# Patient Record
Sex: Male | Born: 1950 | Race: White | Hispanic: No | Marital: Married | Smoking: Former smoker
Health system: Southern US, Community
[De-identification: ages and names within clinical notes are randomized; demographics above are authoritative.]

## PROBLEM LIST (undated history)

## (undated) DIAGNOSIS — E8881 Metabolic syndrome: Secondary | ICD-10-CM

## (undated) DIAGNOSIS — N529 Male erectile dysfunction, unspecified: Secondary | ICD-10-CM

## (undated) DIAGNOSIS — E785 Hyperlipidemia, unspecified: Secondary | ICD-10-CM

## (undated) DIAGNOSIS — E119 Type 2 diabetes mellitus without complications: Secondary | ICD-10-CM

## (undated) DIAGNOSIS — E039 Hypothyroidism, unspecified: Secondary | ICD-10-CM

## (undated) DIAGNOSIS — J449 Chronic obstructive pulmonary disease, unspecified: Secondary | ICD-10-CM

## (undated) DIAGNOSIS — R7303 Prediabetes: Secondary | ICD-10-CM

## (undated) DIAGNOSIS — G4730 Sleep apnea, unspecified: Secondary | ICD-10-CM

## (undated) DIAGNOSIS — M1990 Unspecified osteoarthritis, unspecified site: Secondary | ICD-10-CM

## (undated) DIAGNOSIS — K5790 Diverticulosis of intestine, part unspecified, without perforation or abscess without bleeding: Secondary | ICD-10-CM

## (undated) HISTORY — DX: Diverticulosis of intestine, part unspecified, without perforation or abscess without bleeding: K57.90

## (undated) HISTORY — DX: Hypothyroidism, unspecified: E03.9

## (undated) HISTORY — DX: Metabolic syndrome: E88.810

## (undated) HISTORY — DX: Hyperlipidemia, unspecified: E78.5

## (undated) HISTORY — DX: Male erectile dysfunction, unspecified: N52.9

## (undated) HISTORY — DX: Type 2 diabetes mellitus without complications: E11.9

## (undated) HISTORY — DX: Sleep apnea, unspecified: G47.30

## (undated) HISTORY — DX: Unspecified osteoarthritis, unspecified site: M19.90

## (undated) HISTORY — DX: Chronic obstructive pulmonary disease, unspecified: J44.9

## (undated) HISTORY — DX: Metabolic syndrome: E88.81

## (undated) HISTORY — PX: POLYPECTOMY: SHX149

## (undated) HISTORY — PX: VASECTOMY: SHX75

---

## 1998-01-08 ENCOUNTER — Ambulatory Visit: Admission: RE | Admit: 1998-01-08 | Discharge: 1998-01-08 | Payer: Self-pay | Admitting: Otolaryngology

## 1998-02-26 ENCOUNTER — Ambulatory Visit: Admission: RE | Admit: 1998-02-26 | Discharge: 1998-02-26 | Payer: Self-pay | Admitting: Otolaryngology

## 2000-07-07 ENCOUNTER — Emergency Department (HOSPITAL_COMMUNITY): Admission: EM | Admit: 2000-07-07 | Discharge: 2000-07-07 | Payer: Self-pay | Admitting: Emergency Medicine

## 2003-04-18 HISTORY — PX: OTHER SURGICAL HISTORY: SHX169

## 2005-04-17 HISTORY — PX: OTHER SURGICAL HISTORY: SHX169

## 2005-10-04 ENCOUNTER — Ambulatory Visit: Payer: Self-pay | Admitting: Family Medicine

## 2005-10-23 ENCOUNTER — Ambulatory Visit: Payer: Self-pay | Admitting: Internal Medicine

## 2005-11-03 ENCOUNTER — Ambulatory Visit: Payer: Self-pay | Admitting: Internal Medicine

## 2005-11-20 ENCOUNTER — Ambulatory Visit: Payer: Self-pay | Admitting: Internal Medicine

## 2005-12-15 ENCOUNTER — Ambulatory Visit: Payer: Self-pay | Admitting: Gastroenterology

## 2006-01-04 ENCOUNTER — Encounter: Payer: Self-pay | Admitting: Gastroenterology

## 2006-01-04 ENCOUNTER — Ambulatory Visit: Payer: Self-pay | Admitting: Gastroenterology

## 2006-02-26 ENCOUNTER — Ambulatory Visit: Payer: Self-pay | Admitting: Internal Medicine

## 2006-03-05 ENCOUNTER — Ambulatory Visit: Payer: Self-pay | Admitting: Internal Medicine

## 2006-03-05 LAB — CONVERTED CEMR LAB
ALT: 35 units/L (ref 0–40)
AST: 26 units/L (ref 0–37)
Chol/HDL Ratio, serum: 4.8
LDL DIRECT: 117.6 mg/dL
Triglyceride fasting, serum: 354 mg/dL (ref 0–149)

## 2007-07-14 ENCOUNTER — Telehealth (INDEPENDENT_AMBULATORY_CARE_PROVIDER_SITE_OTHER): Payer: Self-pay | Admitting: *Deleted

## 2007-07-15 ENCOUNTER — Telehealth (INDEPENDENT_AMBULATORY_CARE_PROVIDER_SITE_OTHER): Payer: Self-pay | Admitting: *Deleted

## 2007-08-05 ENCOUNTER — Ambulatory Visit: Payer: Self-pay | Admitting: Internal Medicine

## 2007-08-11 ENCOUNTER — Encounter (INDEPENDENT_AMBULATORY_CARE_PROVIDER_SITE_OTHER): Payer: Self-pay | Admitting: *Deleted

## 2007-08-11 LAB — CONVERTED CEMR LAB
ALT: 84 units/L — ABNORMAL HIGH (ref 0–53)
AST: 44 units/L — ABNORMAL HIGH (ref 0–37)
Alkaline Phosphatase: 49 units/L (ref 39–117)
Bilirubin, Direct: 0.1 mg/dL (ref 0.0–0.3)
CO2: 28 meq/L (ref 19–32)
Chloride: 110 meq/L (ref 96–112)
Glucose, Bld: 108 mg/dL — ABNORMAL HIGH (ref 70–99)
Sodium: 142 meq/L (ref 135–145)
Total Bilirubin: 1.6 mg/dL — ABNORMAL HIGH (ref 0.3–1.2)
Total CHOL/HDL Ratio: 6.1
Total Protein: 7 g/dL (ref 6.0–8.3)
Triglycerides: 297 mg/dL (ref 0–149)

## 2007-08-12 ENCOUNTER — Ambulatory Visit: Payer: Self-pay | Admitting: Internal Medicine

## 2007-08-12 DIAGNOSIS — E8881 Metabolic syndrome: Secondary | ICD-10-CM

## 2007-08-12 DIAGNOSIS — E039 Hypothyroidism, unspecified: Secondary | ICD-10-CM

## 2007-08-12 DIAGNOSIS — E1169 Type 2 diabetes mellitus with other specified complication: Secondary | ICD-10-CM | POA: Insufficient documentation

## 2007-08-12 DIAGNOSIS — E781 Pure hyperglyceridemia: Secondary | ICD-10-CM

## 2007-08-12 DIAGNOSIS — G4730 Sleep apnea, unspecified: Secondary | ICD-10-CM | POA: Insufficient documentation

## 2007-08-12 LAB — CONVERTED CEMR LAB
HDL goal, serum: 40 mg/dL
LDL Goal: 130 mg/dL

## 2008-01-02 ENCOUNTER — Telehealth (INDEPENDENT_AMBULATORY_CARE_PROVIDER_SITE_OTHER): Payer: Self-pay | Admitting: *Deleted

## 2008-09-09 ENCOUNTER — Ambulatory Visit: Payer: Self-pay | Admitting: Internal Medicine

## 2008-09-09 LAB — CONVERTED CEMR LAB
AST: 41 units/L — ABNORMAL HIGH (ref 0–37)
Albumin: 4.1 g/dL (ref 3.5–5.2)
Alkaline Phosphatase: 62 units/L (ref 39–117)
Bilirubin, Direct: 0.1 mg/dL (ref 0.0–0.3)
Direct LDL: 128.9 mg/dL
HDL: 34.4 mg/dL — ABNORMAL LOW (ref >39.00)
Total Bilirubin: 1.4 mg/dL — ABNORMAL HIGH (ref 0.3–1.2)
Total CHOL/HDL Ratio: 6
VLDL: 43.6 mg/dL — ABNORMAL HIGH (ref 0.0–40.0)

## 2008-09-11 ENCOUNTER — Ambulatory Visit: Payer: Self-pay | Admitting: Internal Medicine

## 2008-09-11 DIAGNOSIS — M722 Plantar fascial fibromatosis: Secondary | ICD-10-CM

## 2008-09-11 DIAGNOSIS — R609 Edema, unspecified: Secondary | ICD-10-CM

## 2009-05-26 ENCOUNTER — Telehealth (INDEPENDENT_AMBULATORY_CARE_PROVIDER_SITE_OTHER): Payer: Self-pay | Admitting: *Deleted

## 2009-06-07 ENCOUNTER — Ambulatory Visit: Payer: Self-pay | Admitting: Internal Medicine

## 2009-06-08 LAB — CONVERTED CEMR LAB
Cholesterol: 196 mg/dL (ref 0–200)
Direct LDL: 138 mg/dL
Hgb A1c MFr Bld: 5.7 % (ref 4.6–6.5)
VLDL: 43.4 mg/dL — ABNORMAL HIGH (ref 0.0–40.0)

## 2009-06-14 ENCOUNTER — Ambulatory Visit: Payer: Self-pay | Admitting: Internal Medicine

## 2009-06-14 DIAGNOSIS — Z86010 Personal history of colonic polyps: Secondary | ICD-10-CM

## 2009-06-14 DIAGNOSIS — K5730 Diverticulosis of large intestine without perforation or abscess without bleeding: Secondary | ICD-10-CM | POA: Insufficient documentation

## 2009-06-14 DIAGNOSIS — F329 Major depressive disorder, single episode, unspecified: Secondary | ICD-10-CM

## 2009-06-14 DIAGNOSIS — R1013 Epigastric pain: Secondary | ICD-10-CM

## 2010-05-17 NOTE — Progress Notes (Signed)
Summary: Appointment Due  Phone Note Outgoing Call Call back at [facility] Phone [phone]   Call placed by: Shonna Chock,  May 26, 2009 10:25 AM Call placed to: Patient Summary of Call: Please contact paitent for lab appointment. Labs due in 12/2008 based on last lab checked, copied and pasted from 09/09/2008:   Please schedule a follow-up appointment in 4 months.NMR Lipoprofile Lipid Panel prior to visit, ICD-9: 277.7, 272.4 3)  TSH prior to visit, ICD-9: 244.9 4)  HbgA1C prior to visit, ICD-9:277.7. Consider over the calf support hose & arch supports; Podiatry consult if no better.  ***This is necessary to continue refilling medications***    Additional Follow-up for Phone Call Additional follow up Details #2::    PATIENT HAS A APPT ON FEB 28,2011 FOR HIS FOLLOW-UP APPT AND LABS ON FEB 21,2011.Marland KitchenMarland KitchenBarb Merino  May 27, 2009 9:03 AM  Follow-up by: Barb Merino,  May 27, 2009 9:03 AM

## 2010-05-17 NOTE — Assessment & Plan Note (Signed)
Summary: 4 MTH FU/KDC   Vital Signs:  Patient profile:   60 year old male Height:      70.5 inches Weight:      286 pounds BMI:     40.60 Pulse rate:   56 / minute Resp:     15 per minute BP sitting:   120 / 68  (left arm)  Vitals Entered By: Doristine Devoid (June 14, 2009 8:03 AM) CC: 4 month roa- also some stomach pain xfew months feels bloating and some discomfort after eating, Abdominal pain   CC:  4 month roa- also some stomach pain xfew months feels bloating and some discomfort after eating and Abdominal pain.  History of Present Illness: Labs reviewed & risks discussed; TG 217, VLDL 43.4, & LDL 138. No diet; weight  up 20-30# with being unemployed & over holidays.On [facility] he lost 35#. No CVE since he is no longer physically active as he was @ [employer].He had abdominal pain 02/25 , but he has has early satiety & abdominal "tightness" after meals.    The patient reports nausea, vomiting, and diarrhea 06/11/2009 after ingesting hushpuppies, ice cream ,cookies & peaches. He  denies constipation, melena, hematochezia, anorexia, and hematemesis.  The location of the pain is epigastric.  The pain is described as sharp.  The patient denies the following symptoms: fever, weight loss, dysuria, chest pain, jaundice, and dark urine.  The painoccurred with specific food as noted  The pain was minimally  better with antacids.  His brother is overweight with DM & enlarged liver.  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 ENT:  Denies difficulty swallowing and hoarseness. Resp:  Denies excessive snoring, hypersomnolence, and morning headaches. GI:  Denies constipation and indigestion; N,V & diarrhea was isolated   episode. GU:  Denies discharge and dysuria. Derm:  Denies changes in nail beds, dryness, and hair loss. Neuro:  Complains of numbness; denies tingling; Numbess in feet from ruptured dics. Psych:  Complains of anxiety, depression, easily angered, and irritability; denies easily tearful. Endo:  Complains of heat intolerance; denies cold intolerance, excessive hunger, excessive thirst, and excessive urination.  Physical Exam  General:  in no acute distress; alert,appropriate and cooperative throughout examination Eyes:  No corneal or conjunctival inflammation noted.No icterus Mouth:  Oral mucosa and oropharynx without lesions or exudates.  Teeth in good repair.No pharyngeal erythema.   Neck:  No deformities, masses, or tenderness noted. Lungs:  Normal respiratory effort, chest expands symmetrically. Lungs are clear to auscultation, no crackles or wheezes. Heart:  regular rhythm and bradycardia.  S4 with slurring Abdomen:  Bowel sounds positive,abdomen soft and non-tender without masses, organomegaly or hernias noted. Protuberant ; RUQ dullness Pulses:  R and L carotid,radial,dorsalis pedis and posterior tibial pulses are full and equal bilaterally Extremities:  No clubbing, cyanosis, edema. Neurologic:  alert & oriented X3 and DTRs symmetrical and normal.   Skin:  Intact without suspicious lesions or rashes. No jaundice Cervical Nodes:  No lymphadenopathy noted Axillary Nodes:  No palpable lymphadenopathy Psych:  normally interactive  but  subdued.     Impression & Recommendations:  Problem # 1:  ABDOMINAL PAIN, EPIGASTRIC (ICD-789.06) Probable HH;R/O GB disease  Problem # 2:  SLEEP APNEA (ICD-780.57) controlled with CPAP  Problem #  3:  HYPOTHYROIDISM (ICD-244.9) TSH therapeutic His updated medication list for this problem includes:    Levothyroxine Sodium 25 Mcg Tabs (Levothyroxine sodium) .Marland Kitchen... Take one tablet daily m,w,f,sun; 1& 1/2  tues ,thurs , sat. pt due for labs  now  Problem # 4:  COLONIC POLYPS, HX OF (ICD-V12.72) Colonoscopy as per GI  Problem # 5:  DEPRESSION (ICD-311)  His updated medication list for this problem includes:    Fluoxetine Hcl 10 Mg Caps (Fluoxetine hcl) .Marland Kitchen... 1 once daily  Complete Medication List: 1)  Levothyroxine Sodium 25 Mcg Tabs (Levothyroxine sodium) .... Take one tablet daily m,w,f,sun; 1& 1/2  tues ,thurs , sat. pt due for labs now 2)  Centrum Silver  3)  Glucosamine  4)  Ibuprofen Daily  5)  Transderm-scop 1.5 Mg Pt72 (Scopolamine base) .Marland Kitchen.. 1 patch every 3 day as needed 6)  Fluoxetine Hcl 10 Mg Caps (Fluoxetine hcl) .Marland Kitchen.. 1 once daily  Patient Instructions: 1)  Follow a low carb diet (NOT Atkin's); consume LESS THAN 40 grams of sugar / day from foods & drinks with High Fructose Corn Syrup as #1,2 or #3 on label. 2)  Please schedule a follow-up appointment in 6 months. 3)  Lipid Panel prior to visit, ICD-9:277.7 Prescriptions: FLUOXETINE HCL 10 MG CAPS (FLUOXETINE HCL) 1 once daily  #30 x 5   Entered and Authorized by:   Marga Melnick MD   Signed by:   Marga Melnick MD on 06/14/2009   Method used:   Print then Give to Patient   RxID:   [id number] LEVOTHYROXINE SODIUM 25 MCG  TABS (LEVOTHYROXINE SODIUM) Take one tablet daily M,W,F,Sun; 1& 1/2  Tues ,Thurs , Sat. pt due for labs now  #90 Tablet x 3   Entered and Authorized by:   Marga Melnick MD   Signed by:   Marga Melnick MD on 06/14/2009   Method used:   Printed then faxed to ...       [id number] (retail)       [address]       [place], [place]  [address]       Ph: [phone] or [phone]       Fax: [phone]   RxID:   [id number]

## 2010-05-17 NOTE — Assessment & Plan Note (Signed)
Summary: checkup/ meds/kdc   Vital Signs:  Patient profile:   60 year old male Height:      70.5 inches Weight:      267.8 pounds BMI:     38.02 Temp:     98.3 degrees F oral Pulse rate:   84 / minute Resp:     16 per minute BP sitting:   128 / 76  (left arm) Cuff size:   large  Vitals Entered By: Shonna Chock (Sep 11, 2008 10:40 AM) CC: FOLLOW-UP ON LABS : DISCUSS DISPENSE NUMBER FOR THYROID MEDS   CC:  FOLLOW-UP ON LABS : DISCUSS DISPENSE NUMBER FOR THYROID MEDS.  History of Present Illness: Not on low carb now; active on job but no CVE. Labs reviewed , risks, & low glycemic index & load  discussed .  Allergies (verified): No Known Drug Allergies  Review of Systems General:  Complains of sleep disorder; denies fatigue. ENT:  Denies difficulty swallowing and hoarseness. CV:  Complains of swelling of feet; denies chest pain or discomfort, leg cramps with exertion, and shortness of breath with exertion; DOE improved. RLE edema . Resp:  Denies excessive snoring, hypersomnolence, and morning headaches; Newmask with improvement. GI:  Denies constipation and diarrhea. GU:  [place] urgency. MS:  Complains of joint pain; L heel hurts. Derm:  Denies changes in nail beds, dryness, and hair loss. Neuro:  Complains of numbness; Numbness R great  toe . Endo:  Complains of heat intolerance; denies cold intolerance, excessive hunger, excessive thirst, and excessive urination.  Physical Exam  General:  in no acute distress; alert,appropriate and cooperative throughout examination Neck:  No deformities, masses, or tenderness noted. Lungs:  Normal respiratory effort, chest expands symmetrically. Lungs are clear to auscultation, no crackles or wheezes. Heart:  Normal rate and regular rhythm. S1 and S2 normal without gallop, murmur, click, rub.S4 Pulses:  R and L carotid,radial,dorsalis pedis and posterior tibial pulses are full and equal bilaterally Extremities:  No clubbing, cyanosis,  edema. Varicose veins RLE > LLE. Deformed toenails. Pes planus. L Achilles tendon & heel not tender to percussion Neurologic:  DTRs symmetrical and normal.  Decreased sensation R great toe  Skin:  Intact without suspicious lesions or rashes Psych:  memory intact for recent and remote, normally interactive, and good eye contact.     Impression & Recommendations:  Problem # 1:  DYSMETABOLIC SYNDROME X (ICD-277.7)  Problem # 2:  HYPOTHYROIDISM (ICD-244.9)  His updated medication list for this problem includes:    Levothyroxine Sodium 25 Mcg Tabs (Levothyroxine sodium) .Marland Kitchen... Take one tablet daily m,w,f,sun; 1& 1/2  tues ,thurs , sat. pt due for labs now  Problem # 3:  EDEMA- LOCALIZED (ICD-782.3) Due to VV;support hose declined  Problem # 4:  PLANTAR FASCIITIS, LEFT (ICD-728.71)  Complete Medication List: 1)  Levothyroxine Sodium 25 Mcg Tabs (Levothyroxine sodium) .... Take one tablet daily m,w,f,sun; 1& 1/2  tues ,thurs , sat. pt due for labs now 2)  Centrum Silver  3)  Glucosamine  4)  Ibuprofen Daily  5)  Transderm-scop 1.5 Mg Pt72 (Scopolamine base) .Marland Kitchen.. 1 patch every 3 day as needed  Patient Instructions: 1)  Follow [employer] low glycemic index & load program as discussed. 2)  Please schedule a follow-up appointment in 4 months.NMR Lipoprofile Lipid Panel prior to visit, ICD-9: 277.7, 272.4 3)  TSH prior to visit, ICD-9: 244.9 4)  HbgA1C prior to visit, ICD-9:277.7. Consider over the calf support hose & arch supports;  Podiatry consult if no better. Prescriptions: LEVOTHYROXINE SODIUM 25 MCG  TABS (LEVOTHYROXINE SODIUM) Take one tablet daily M,W,F,Sun; 1& 1/2  Tues ,Thurs , Sat. pt due for labs now  #90 x 3   Entered and Authorized by:   Marga Melnick MD   Signed by:   Marga Melnick MD on 09/11/2008   Method used:   Print then Give to Patient   RxID:   [id number]

## 2010-08-22 ENCOUNTER — Other Ambulatory Visit: Payer: Self-pay | Admitting: Internal Medicine

## 2010-08-22 NOTE — Telephone Encounter (Signed)
TSH 244.9 

## 2010-11-14 ENCOUNTER — Telehealth: Payer: Self-pay | Admitting: Internal Medicine

## 2010-11-14 MED ORDER — LEVOTHYROXINE SODIUM 25 MCG PO TABS: ORAL_TABLET | ORAL | Status: DC

## 2010-11-14 NOTE — Telephone Encounter (Signed)
SENT IN

## 2010-12-09 ENCOUNTER — Other Ambulatory Visit: Payer: Self-pay | Admitting: Internal Medicine

## 2010-12-10 ENCOUNTER — Other Ambulatory Visit: Payer: Self-pay | Admitting: Internal Medicine

## 2010-12-12 NOTE — Telephone Encounter (Signed)
Spoke with patient, patient aware rx will be updated at next refill.  Yes please add lipids 277.7

## 2010-12-12 NOTE — Telephone Encounter (Signed)
Added lipids to 8/31 lab

## 2010-12-15 ENCOUNTER — Other Ambulatory Visit: Payer: Self-pay | Admitting: Internal Medicine

## 2010-12-15 DIAGNOSIS — E8881 Metabolic syndrome: Secondary | ICD-10-CM

## 2010-12-15 DIAGNOSIS — E039 Hypothyroidism, unspecified: Secondary | ICD-10-CM

## 2010-12-16 ENCOUNTER — Other Ambulatory Visit (INDEPENDENT_AMBULATORY_CARE_PROVIDER_SITE_OTHER): Payer: BC Managed Care – PPO

## 2010-12-16 DIAGNOSIS — E8881 Metabolic syndrome: Secondary | ICD-10-CM

## 2010-12-16 DIAGNOSIS — E039 Hypothyroidism, unspecified: Secondary | ICD-10-CM

## 2010-12-16 LAB — TSH: TSH: 18.63 u[IU]/mL — ABNORMAL HIGH (ref 0.35–5.50)

## 2010-12-16 LAB — LIPID PANEL: Cholesterol: 198 mg/dL (ref 0–200)

## 2010-12-16 NOTE — Progress Notes (Signed)
Labs only

## 2010-12-20 ENCOUNTER — Encounter: Payer: Self-pay | Admitting: *Deleted

## 2010-12-27 ENCOUNTER — Telehealth: Payer: Self-pay | Admitting: Internal Medicine

## 2010-12-27 NOTE — Telephone Encounter (Signed)
Message copied by Merrie Roof on Tue Dec 27, 2010 11:59 AM ------      Message from: Pecola Lawless      Created: Mon Dec 12, 2010  5:41 PM       Please  schedule fasting Labs : BMET,Lipids, hepatic panel, TSH ( 277.7, 244.9) & F/U appt. Last labs 02/11

## 2010-12-27 NOTE — Telephone Encounter (Signed)
Labs 9/12 will determine whether we need to have his  physical rescheduled earlier.

## 2010-12-27 NOTE — Telephone Encounter (Signed)
Had labs for tsh and lipids on 8/31, will have other two labs on 9/12---when he comes in for labs, will make f/up appt for Dr Pura Spice has appt for CPX on 02/24/2011

## 2010-12-27 NOTE — Telephone Encounter (Signed)
error 

## 2010-12-28 ENCOUNTER — Other Ambulatory Visit: Payer: Self-pay | Admitting: Internal Medicine

## 2010-12-29 ENCOUNTER — Other Ambulatory Visit (INDEPENDENT_AMBULATORY_CARE_PROVIDER_SITE_OTHER): Payer: BC Managed Care – PPO

## 2010-12-29 DIAGNOSIS — E039 Hypothyroidism, unspecified: Secondary | ICD-10-CM

## 2010-12-29 DIAGNOSIS — E8881 Metabolic syndrome: Secondary | ICD-10-CM

## 2010-12-29 LAB — BASIC METABOLIC PANEL
BUN: 16 mg/dL (ref 6–23)
Chloride: 108 mEq/L (ref 96–112)
Glucose, Bld: 114 mg/dL — ABNORMAL HIGH (ref 70–99)
Potassium: 4 mEq/L (ref 3.5–5.1)
Sodium: 141 mEq/L (ref 135–145)

## 2010-12-29 LAB — HEPATIC FUNCTION PANEL
ALT: 95 U/L — ABNORMAL HIGH (ref 0–53)
AST: 54 U/L — ABNORMAL HIGH (ref 0–37)
Albumin: 4.3 g/dL (ref 3.5–5.2)
Alkaline Phosphatase: 78 U/L (ref 39–117)
Total Bilirubin: 1.1 mg/dL (ref 0.3–1.2)

## 2010-12-29 NOTE — Progress Notes (Signed)
Labs only

## 2010-12-30 NOTE — Progress Notes (Signed)
Labs only

## 2011-01-03 ENCOUNTER — Ambulatory Visit (INDEPENDENT_AMBULATORY_CARE_PROVIDER_SITE_OTHER): Payer: BC Managed Care – PPO | Admitting: Internal Medicine

## 2011-01-03 ENCOUNTER — Encounter: Payer: Self-pay | Admitting: Internal Medicine

## 2011-01-03 DIAGNOSIS — E039 Hypothyroidism, unspecified: Secondary | ICD-10-CM

## 2011-01-03 DIAGNOSIS — E785 Hyperlipidemia, unspecified: Secondary | ICD-10-CM

## 2011-01-03 DIAGNOSIS — E8881 Metabolic syndrome: Secondary | ICD-10-CM

## 2011-01-03 NOTE — Progress Notes (Signed)
  Subjective:    Patient ID: Dalton Murphy, male    DOB: 08/08/1950, 60 y.o.   MRN: [id number]  HPI Hyperlipidemia: Triglycerides 602, HDL 32.8, LDL 120.4 Chest pain, palpitations- no       Dyspnea- some DOE with yardwork Medications: Compliance- not on statin  Lightheadedness only if meal skipped; no Syncope  Edema- some Abd pain, bowel changes- no (see TG )   Muscle aches- some cramps  Metabolic Syndrome: Disease Monitoring: Blood Sugar ranges-not checked  Polyuria/phagia/dipsia- only urinary urgency       Visual problems- no Medications: Compliance- no meds  Hypoglycemic symptoms- only as noted above  Hypothyroidism: TSH 18.63 Medications status(change in dose/brand/mode of administration):no change Constitutional: Weight change: stable; Fatigue:no; Sleep pattern:using CPAP; Appetite:good  Hoarseness:no; Swallowing issues:no GI: Constipation:no; Diarrhea:no but some loose stool Derm: Change in nails/hair/skin:no Neuro: Numbness/tingling:some R foot @ base of large toe; Tremor:no Psych: Anxiety:intermittent; Depression:no Endo: Temperature intolerance: Heat:yes; Cold:no     Review of Systems     Objective:   Physical Exam Gen.: well-nourished in appearance; but weight excess. Alert, appropriate and cooperative throughout exam. Head: Normocephalic without obvious abnormalities  Eyes: No corneal or conjunctival inflammation noted.  Extraocular motion intact. No lid lag Mouth: Oral mucosa and oropharynx reveal no lesions or exudates. Teeth in good repair.Hoarse Neck: No deformities, masses, or tenderness noted. Range of motion slightly decreased. Thyroid normal. Lungs: Normal respiratory effort; chest expands symmetrically. Lungs are clear to auscultation without rales, wheezes, or increased work of breathing. Heart: Slow rate and regular rhythm. Normal S1 and S2. No gallop, click, or rub. No  murmur. Abdomen: Bowel sounds normal; abdomen soft and nontender. No masses,  organomegaly.Ventral hernia noted.                                                                            Musculoskeletal/extremities: No deformity or scoliosis noted of  the thoracic or lumbar spine. No clubbing, cyanosis, edema, or deformity noted.Tone & strength  Normal.Joints: minor DIP OA changes. Valgus knee changes.  Nail health :chronic changes L great toe nail  Vascular: Carotid, radial artery, dorsalis pedis and  posterior tibial pulses are full and equal. No bruits present. Neurologic: Alert and oriented x3. Deep tendon reflexes symmetrical but 0-1/2 + @ nees         Skin: Intact without suspicious lesions or rashes. Lymph: No cervical, axillary lymphadenopathy present. Psych: Mood and affect are slightly flat; "la belle indifference" suggested during discussion of risks. Normally interactive                                                                                         Assessment & Plan:  #1 hypothyroidism #2dyslipidemia; high risk of  Pancreatitis due to TG > 500 ( risk discussed) #3 fatty liver Plan: see Orders

## 2011-01-03 NOTE — Patient Instructions (Signed)
Eat a low-fat diet with lots of fruits and vegetables, up to 7-9 servings per day. Avoid obesity; your goal is waist measurement < 40 inches.Consume less than 40 grams of sugar per day from foods & drinks with High Fructose Corn Sugar as #1,2,3 or # 4 on label. Follow the low carb nutrition program in [employer] as closely as possible to prevent Diabetes progression & complications. White carbohydrates (potatoes, rice, bread, and pasta) have a high spike of sugar and a high load of sugar. For example a  baked potato has a cup of sugar and a  french fry  2 teaspoons of sugar. Yams, wild  rice, whole grained bread &  wheat pasta have been much lower spike and load of  sugar. Portions should be the size of a deck of cards or your palm.  Please  schedule fasting Labs in 10 weeks : BMET,Lipids, hepatic panel, CBC & dif, TSH, A1c ( 244.9, 277.7, 790.29)

## 2011-01-05 ENCOUNTER — Other Ambulatory Visit: Payer: Self-pay | Admitting: Internal Medicine

## 2011-01-07 MED ORDER — LEVOTHYROXINE SODIUM 50 MCG PO TABS: 50.0000 ug | ORAL_TABLET | ORAL | Status: DC

## 2011-01-20 ENCOUNTER — Encounter: Payer: Self-pay | Admitting: Gastroenterology

## 2011-02-23 ENCOUNTER — Encounter: Payer: Self-pay | Admitting: Internal Medicine

## 2011-02-24 ENCOUNTER — Encounter: Payer: Self-pay | Admitting: Internal Medicine

## 2011-02-24 ENCOUNTER — Ambulatory Visit (INDEPENDENT_AMBULATORY_CARE_PROVIDER_SITE_OTHER): Payer: BC Managed Care – PPO | Admitting: Internal Medicine

## 2011-02-24 DIAGNOSIS — E8881 Metabolic syndrome: Secondary | ICD-10-CM

## 2011-02-24 DIAGNOSIS — Z0000 Encounter for general adult medical examination without abnormal findings: Secondary | ICD-10-CM

## 2011-02-24 DIAGNOSIS — Z860100 Personal history of colon polyps, unspecified: Secondary | ICD-10-CM

## 2011-02-24 DIAGNOSIS — E039 Hypothyroidism, unspecified: Secondary | ICD-10-CM

## 2011-02-24 DIAGNOSIS — G4730 Sleep apnea, unspecified: Secondary | ICD-10-CM

## 2011-02-24 LAB — HEPATIC FUNCTION PANEL
AST: 34 U/L (ref 0–37)
Alkaline Phosphatase: 62 U/L (ref 39–117)
Bilirubin, Direct: 0.1 mg/dL (ref 0.0–0.3)

## 2011-02-24 LAB — LIPID PANEL
LDL Cholesterol: 101 mg/dL — ABNORMAL HIGH (ref 0–99)
Total CHOL/HDL Ratio: 4
Triglycerides: 158 mg/dL — ABNORMAL HIGH (ref 0.0–149.0)

## 2011-02-24 LAB — TSH: TSH: 10.72 u[IU]/mL — ABNORMAL HIGH (ref 0.35–5.50)

## 2011-02-24 LAB — CBC WITH DIFFERENTIAL/PLATELET
Basophils Absolute: 0 10*3/uL (ref 0.0–0.1)
Eosinophils Relative: 1 % (ref 0.0–5.0)
Hemoglobin: 15.5 g/dL (ref 13.0–17.0)
Lymphocytes Relative: 33.4 % (ref 12.0–46.0)
Monocytes Relative: 5.3 % (ref 3.0–12.0)
Neutro Abs: 3.6 10*3/uL (ref 1.4–7.7)
Platelets: 166 10*3/uL (ref 150.0–400.0)
RDW: 14.1 % (ref 11.5–14.6)
WBC: 6 10*3/uL (ref 4.5–10.5)

## 2011-02-24 NOTE — Progress Notes (Signed)
Subjective:    Patient ID: Dalton Murphy, male    DOB: 1950-04-20, 60 y.o.   MRN: [id number]  HPI  Dalton Murphy  is here for a physical;acute issues include pain in both knees, TKR was recommended on R by [facility].      Review of Systems DYSLIPIDEMIA: TG 217- 602; LDL 115- 138; HDL 32.8-43.7 Chest pain, palpitations- no       Dyspnea- no Medications: Compliance- no  Lightheadedness,Syncope- no    Edema- yes after being on feet 8 hrs Polyuria/phagia/dipsia- no      Visual problems- no Abd pain, bowel changes- no   Muscle aches- not significant  HYPOTHYROIDISM:TSH was 18.63 on 8/31 Medications status(change in dose/brand/mode of administration):dose increased in Sept to 25 mcg  1&1/2  tablets T, Th & Sat; 1 all other days Constitutional: Weight change: down 10 #; Fatigue:no; Sleep pattern:good with CPAP; Appetite:good  Hoarseness:no; Swallowing issues:no Cardiovascular: Palpitations:no; Racing:no; Irregularity:no Derm: Change in nails/hair/skin:no Neuro: Numbness/tingling:no; Tremor:no Psych: Anxiety:no; Depression:no Endo: Temperature intolerance: Heat:yes; Cold:no          Objective:   Physical Exam Gen.:  well-nourished in appearance. Cooperative throughout exam. Head: Normocephalic without obvious abnormalities Eyes: No corneal or conjunctival inflammation noted. Pupils equal round reactive to light and accommodation. Fundal exam is benign without hemorrhages, exudate, papilledema. Extraocular motion intact. Vision grossly normal. Ears: External  ear exam reveals no significant lesions or deformities. Canals clear .TMs normal. Hearing is grossly normal bilaterally. Nose: External nasal exam reveals no deformity or inflammation. Nasal mucosa are pink and moist. No lesions or exudates noted. Septum dislocated & deviated  Mouth: Oral mucosa and oropharynx reveal no lesions or exudates. Teeth in good repair. Neck: No deformities, masses, or tenderness noted. Range of  motion &. Thyroid  normal. Lungs: Normal respiratory effort; chest expands symmetrically. Lungs are clear to auscultation without rales, wheezes, or increased work of breathing. Heart: Normal rate and rhythm. Normal S1 and S2. No gallop, click, or rub. S 4 w/o  murmur. Abdomen: Bowel sounds normal; abdomen soft and nontender. No masses, organomegaly . Ventral  hernia noted. Dullness to percussion RUQ Genitalia / DRE: Genital exam is unremarkable except for a slight bulge in the left inguinal canal with cough. Prostate is normal with no nodularity, induration, asymmetry or enlargement. Note: Past medical history includes prostate nodule noted on one occasion.   .                                                                                   Musculoskeletal/extremities: No deformity or scoliosis noted of  the thoracic or lumbar spine. No clubbing, cyanosis, edema, or deformity noted. Range of motion  normal .Tone & strength  normal.Joints normal. Nail health  Good. Crepitus of knees Vascular: Carotid, radial artery, dorsalis pedis and  posterior tibial pulses are full and equal. No bruits present. Neurologic: Alert and oriented x3. Deep tendon reflexes symmetrical and normal.          Skin: Intact without suspicious lesions or rashes. Lymph: No cervical, axillary, or inguinal lymphadenopathy present. Psych: Mood and affect are flat. Normally interactive  Assessment & Plan:  #1 comprehensive physical exam; no acute findings #2 see Problem List with Assessments & Recommendations. It will be essential to check the triglycerides as  the prior level increased his risk of pancreatitis. Additionally the TSH needs to be in  Therapeutic  range. He apparently is due for followup colonoscopy because of the adenomatous polyps found in 2007 Plan: see Orders

## 2011-02-24 NOTE — Patient Instructions (Signed)
Preventive Health Care: Exercise at least 30-45 minutes a day,  3-4 days a week.  Eat a low-fat diet with lots of fruits and vegetables, up to 7-9 servings per day. Avoid obesity; your goal is waist measurement < 40 inches.Consume less than 40 grams of sugar per day from foods & drinks with High Fructose Corn Sugar as # 1,2,3 or # 4 on label. As per the Standard of Care , screening Colonoscopy recommended @ 50 & every 5-10 years thereafter . More frequent monitor would be dictated by family history or findings @ Colonoscopy

## 2011-02-28 ENCOUNTER — Encounter: Payer: Self-pay | Admitting: Gastroenterology

## 2011-02-28 ENCOUNTER — Telehealth: Payer: Self-pay

## 2011-02-28 MED ORDER — LEVOTHYROXINE SODIUM 50 MCG PO TABS: 50.0000 ug | ORAL_TABLET | ORAL | Status: DC

## 2011-02-28 NOTE — Telephone Encounter (Signed)
Rx mailed with labs to patient

## 2011-02-28 NOTE — Telephone Encounter (Signed)
Message copied by Edgardo Roys on Tue Feb 28, 2011  2:20 PM ------      Message from: Pecola Lawless      Created: Sun Feb 26, 2011  3:46 PM       Please send a prescription for L thyroxine 50 mcg ; dispense 90, one  daily

## 2011-03-08 ENCOUNTER — Telehealth: Payer: Self-pay

## 2011-03-08 NOTE — Telephone Encounter (Signed)
Patient's wife called and left message on voicemail: Please call to discuss thyroid medication  I called wife back and there is some confusion on dose and instruction for thyroid med, I advised patient's wife that I will discuss with Dr.Hopper and call her back (I was unable to figure out).  Dr.Hopper please advise: below is the instruction patient was given on 01/03/2011:   Take 1 tablet (50 mcg total) by mouth as directed. 1 qd except 1&1/2 T, Th & Sat - Oral, this is the instruction from recent labs: Please take Synthroid 25 mcg TWO daily until present supply is completeted ; the fill new Rx  Patient did not have tab he had the as prescribed on 9/18/201. Please advise on what patient should take

## 2011-03-08 NOTE — Telephone Encounter (Signed)
Spoke with patient's wife, informed of instruction for thyroid med. Scheduled appointment to recheck in 10 weeks

## 2011-03-08 NOTE — Telephone Encounter (Signed)
He apparently was confused about his dose. At this point he should take 75 mcg daily (50 mcg one half pills daily) and recheck a TSH in 10 weeks (244.9 )

## 2011-04-04 ENCOUNTER — Encounter: Payer: BC Managed Care – PPO | Admitting: Gastroenterology

## 2011-05-16 ENCOUNTER — Other Ambulatory Visit: Payer: Self-pay | Admitting: Internal Medicine

## 2011-05-16 DIAGNOSIS — E039 Hypothyroidism, unspecified: Secondary | ICD-10-CM

## 2011-05-17 ENCOUNTER — Other Ambulatory Visit (INDEPENDENT_AMBULATORY_CARE_PROVIDER_SITE_OTHER): Payer: Managed Care, Other (non HMO)

## 2011-05-17 DIAGNOSIS — E039 Hypothyroidism, unspecified: Secondary | ICD-10-CM

## 2011-05-17 NOTE — Progress Notes (Signed)
LABS ONLY  

## 2011-05-26 ENCOUNTER — Telehealth: Payer: Self-pay | Admitting: *Deleted

## 2011-05-26 MED ORDER — LEVOTHYROXINE SODIUM 100 MCG PO TABS: 100.0000 ug | ORAL_TABLET | Freq: Every day | ORAL | Status: DC

## 2011-05-26 NOTE — Telephone Encounter (Signed)
Left message to call office

## 2011-05-26 NOTE — Telephone Encounter (Signed)
Pt is currently already taking 50 mcg 1 1/2 tab daily and new instruction advise for him to increase to 50 mcg daily EXCEPT 75 mcg (1 & 1/2 pills ) T, Th & Sat. Please advise on how Pt to take med. Pt also need 90 day supply sent in to pharmacy. [facility].

## 2011-05-26 NOTE — Telephone Encounter (Signed)
Dose apparently  was incorrect in the medication list. Change the dose to 100 mcg daily with a recheck of TSH in 10 weeks. (100 mcg #90)

## 2011-05-26 NOTE — Telephone Encounter (Signed)
Discuss with patient wife, Rx sent to pharmacy 

## 2011-07-27 ENCOUNTER — Other Ambulatory Visit: Payer: Managed Care, Other (non HMO)

## 2011-07-27 ENCOUNTER — Other Ambulatory Visit (INDEPENDENT_AMBULATORY_CARE_PROVIDER_SITE_OTHER): Payer: Managed Care, Other (non HMO)

## 2011-07-27 ENCOUNTER — Encounter: Payer: Self-pay | Admitting: Internal Medicine

## 2011-07-27 ENCOUNTER — Ambulatory Visit (INDEPENDENT_AMBULATORY_CARE_PROVIDER_SITE_OTHER): Payer: Managed Care, Other (non HMO) | Admitting: Internal Medicine

## 2011-07-27 VITALS — BP 132/78 | HR 71 | Temp 97.6°F | Wt 274.0 lb

## 2011-07-27 DIAGNOSIS — E039 Hypothyroidism, unspecified: Secondary | ICD-10-CM

## 2011-07-27 DIAGNOSIS — M79609 Pain in unspecified limb: Secondary | ICD-10-CM

## 2011-07-27 DIAGNOSIS — M79671 Pain in right foot: Secondary | ICD-10-CM

## 2011-07-27 DIAGNOSIS — E785 Hyperlipidemia, unspecified: Secondary | ICD-10-CM

## 2011-07-27 LAB — BASIC METABOLIC PANEL
CO2: 22 mEq/L (ref 19–32)
Chloride: 107 mEq/L (ref 96–112)
Creatinine, Ser: 0.7 mg/dL (ref 0.4–1.5)
Glucose, Bld: 107 mg/dL — ABNORMAL HIGH (ref 70–99)

## 2011-07-27 LAB — HEPATIC FUNCTION PANEL
ALT: 36 U/L (ref 0–53)
AST: 25 U/L (ref 0–37)
Albumin: 4.3 g/dL (ref 3.5–5.2)
Alkaline Phosphatase: 74 U/L (ref 39–117)
Total Protein: 7.5 g/dL (ref 6.0–8.3)

## 2011-07-27 LAB — CBC WITH DIFFERENTIAL/PLATELET
Basophils Relative: 1 % (ref 0.0–3.0)
Eosinophils Relative: 1.5 % (ref 0.0–5.0)
HCT: 45.1 % (ref 39.0–52.0)
Hemoglobin: 15.5 g/dL (ref 13.0–17.0)
Lymphs Abs: 2 10*3/uL (ref 0.7–4.0)
MCV: 90.3 fl (ref 78.0–100.0)
Monocytes Absolute: 0.4 10*3/uL (ref 0.1–1.0)
Monocytes Relative: 5.7 % (ref 3.0–12.0)
Neutro Abs: 4.4 10*3/uL (ref 1.4–7.7)
Platelets: 162 10*3/uL (ref 150.0–400.0)
WBC: 6.9 10*3/uL (ref 4.5–10.5)

## 2011-07-27 LAB — LIPID PANEL
Cholesterol: 183 mg/dL (ref 0–200)
Total CHOL/HDL Ratio: 5

## 2011-07-27 LAB — URIC ACID: Uric Acid, Serum: 5.1 mg/dL (ref 4.0–7.8)

## 2011-07-27 MED ORDER — TRAMADOL HCL 50 MG PO TABS: 50.0000 mg | ORAL_TABLET | Freq: Four times a day (QID) | ORAL | Status: AC | PRN

## 2011-07-27 NOTE — Patient Instructions (Addendum)
.  Share results with specialist Please try to go on My Chart within the next 24 hours to allow me to release the results directly to you.

## 2011-07-27 NOTE — Progress Notes (Signed)
  Subjective:    Patient ID: Dalton Murphy, male    DOB: [birth year], 61 y.o.   MRN: [id number]  HPI   Extremity Pain Assessment:   Subjective:   CC: right foot pain, swelling, and numbness   HPI: Location: right foot pain, swelling, and numbness Onset: 3 days ago pt noticed right foot pain and is concerned it may be gout Trigger/injury: pt has not noticed any injury however pt is on feet all day at work at [employer] Pain quality (ex: sharp, dull, aching, etc.): sharp "excruciating" pain. explains it is like "the spurs you step on at the beach"; numbness is between first toe and second toe and along top of foot Pain severity: 8-10/10, 0/10 currently Duration: off/on; sharp pain lasts a few minutes Radiation: pain does not radiate Exacerbating factors: walking and bearing weight Relieving factors/Treatment and Response: elevation and rest helps relieve the sharp pain   Pt has notified swelling in both feet for a few years now but the right swells more than the left foot. 3 days ago, pt reports right foot swelling that became worse than usual with "tightness", warmth, numbness and pain. Pt states pain and warmth has improved today however, tightness and numbness remain.    Review of Systems General (ex: fatigue, fever, muscle aches, chills, sweats etc.): Pt denies fatigue, fever, chills, muscle aches, or sweats.                                   Musculoskeletal (ex: muscle cramps/pain, joint swelling/redness/stiffness): Pt denies muscle cramps or muscle pain. Pt does state he has some stiffness in right foot. Neuro: (ex: weakness, numbness/tingling): Pt denies weakness. Pt does have numbness in right foot as discussed above. Pt states he may also experience some tingling at times. Skin, hair, or nail changes/Rash: Pt denies any rashes or changes in skin, hair, or nails. Pt does states he gets in-grown toe nails.       Objective:   Physical Exam  General: Patient appears  well-nourished; Patient is in no acute distress. Neuro: Alert and oriented; Sensation slightly diminished along top of right foot. Lymph: No lymphadenopathy about the neck or axilla. HEENT: No scleral icterus.  Musculoskeletal: No lordosis noted of spine. Full ROM on bilateral ankles and toes. Circulatory: No clubbing noted. Right foot edema 1 plus. The right foot is obviously swollen. There is a firmness to the tissue without pitting. Compression & deep palpation of  the foot does not cause pain. There is no erythema over the foot or increased temperature. He has some chronic toenail changes. Radial and pedal pulses intact, 2 + bilaterally. No clubbing, cyanosis,  or deformity noted of extremities otherwise. Homans sign is negative Integumentary: No ischemic skin changes are present.         Assessment & Plan:  #1 foot pain with swelling; clinically gout or cellulitis/osteomyelitis is not suggested based on absence of fever, tenderness and color change.  His description suggests he may have a neuroma. I do not feel that feels to be of benefit in absence of tenderness with compression.  Uric acid and CBC will be checked. I do recommend foot specialty evaluation.

## 2011-09-28 ENCOUNTER — Other Ambulatory Visit: Payer: Self-pay | Admitting: Internal Medicine

## 2011-09-28 MED ORDER — LEVOTHYROXINE SODIUM 100 MCG PO TABS: 100.0000 ug | ORAL_TABLET | Freq: Every day | ORAL | Status: DC

## 2011-09-28 NOTE — Telephone Encounter (Signed)
Refill sent to pharmacy #90 x no refills. 

## 2011-09-28 NOTE — Telephone Encounter (Signed)
Refill Synthroid tab #90 Take one tablet by mouth every day  Last fill 2.16.13  Last ov 4.11.13

## 2012-01-01 ENCOUNTER — Other Ambulatory Visit: Payer: Self-pay | Admitting: Internal Medicine

## 2012-01-02 NOTE — Telephone Encounter (Signed)
Needs TSH within the next month

## 2012-01-09 ENCOUNTER — Encounter: Payer: Self-pay | Admitting: Gastroenterology

## 2012-02-01 ENCOUNTER — Other Ambulatory Visit: Payer: Self-pay | Admitting: Internal Medicine

## 2012-02-26 ENCOUNTER — Ambulatory Visit (INDEPENDENT_AMBULATORY_CARE_PROVIDER_SITE_OTHER): Payer: Managed Care, Other (non HMO) | Admitting: Internal Medicine

## 2012-02-26 ENCOUNTER — Encounter: Payer: Self-pay | Admitting: Internal Medicine

## 2012-02-26 VITALS — BP 118/76 | HR 69 | Temp 98.4°F | Wt 260.6 lb

## 2012-02-26 DIAGNOSIS — M79609 Pain in unspecified limb: Secondary | ICD-10-CM

## 2012-02-26 DIAGNOSIS — Z23 Encounter for immunization: Secondary | ICD-10-CM

## 2012-02-26 DIAGNOSIS — M79671 Pain in right foot: Secondary | ICD-10-CM

## 2012-02-26 NOTE — Progress Notes (Signed)
  Subjective:    Patient ID: Dalton Murphy, male    DOB: 07/29/1950, 61 y.o.   MRN: [id number]  HPI Edema Onset:pain between great toe & 2nd toe with swelling of R foot since 4/13; evaluated for possible stress fracture & gout by Ortho & Podiatry. The pain was lessened wearing a walking boot for 2 months but recurred when this was taken off. Empiric treatment with 6 days (21 pills) steroids did not result in pain improvement Course: stable Trigger/exacerbating factors: prolonged standing @ work. No excess intake of salt, prolonged standing or travel, or intake of medications associated with edema (amlodipine, steroids) Treatment/response:elevation     Review of Systems Constitutional: no fatigue. On CPAP for sleep  apnea                                                       Cardiovascular: no palpitations; tachycardia; claudication; paroxysmal nocturnal dyspnea Pulmonary:no cough; sputum reduction; exertional dyspnea Endocrinologic:no significant weight change; temperature intolerance; skin/hair/nail changes Vascular: Calf swelling or pain Past medical history: No significant cardiopulmonary,vascular  or hepatorenal disease Family history: No cardiopulmonary,vascular or hepatorenal disease          Objective:   Physical Exam Gen.:  well-nourished in appearance. Alert, appropriate and cooperative throughout exam. Eyes: No corneal or conjunctival inflammation noted. No icterus Lungs: Normal respiratory effort; chest expands symmetrically. Lungs are clear to auscultation without rales, wheezes, or increased work of breathing. Heart: Normal rate and rhythm. Normal S1 and S2. No gallop, click, or rub. S4 w/o murmur. Abdomen: Bowel sounds normal; abdomen soft and nontender. No masses, organomegaly or hernias noted.  Musculoskeletal/extremities: No deformity or scoliosis noted of  the thoracic or lumbar spine. No clubbing, cyanosis, edema, or deformity noted. Range of motion  normal .Tone &  strength  normal.Joints normal. Chronic thickening of the left great nail. Exostosis at the base of the both great toes. Homans sign is negative Vascular:  dorsalis pedis and  posterior tibial pulses are full and equal. No bruits present. Neurologic: Alert and oriented x3. Deep tendon reflexes symmetrical and normal.          Skin: Intact without suspicious lesions or rashes. Faint plethora over the right foot. Feet are cool to touch Lymph: No cervical, axillary lymphadenopathy present. Psych: Mood and affect are normal. Normally interactive                                                                                          Assessment & Plan:  #1  Protracted (7 months) localized pain between the first and second right toes, worse with weightbearing. R/O RSD  Plan: Limited bone scan recommended. If that is negative; I would recommend evaluation by Dr. Enid Baas.

## 2012-02-26 NOTE — Patient Instructions (Addendum)
Review and correct the record as indicated. Please share record with all medical staff seen.  

## 2012-03-07 ENCOUNTER — Other Ambulatory Visit (HOSPITAL_COMMUNITY): Payer: Managed Care, Other (non HMO)

## 2012-03-13 ENCOUNTER — Encounter (HOSPITAL_COMMUNITY): Payer: Managed Care, Other (non HMO)

## 2012-04-02 ENCOUNTER — Encounter (HOSPITAL_COMMUNITY): Payer: Self-pay

## 2012-04-02 ENCOUNTER — Encounter (HOSPITAL_COMMUNITY)
Admission: RE | Admit: 2012-04-02 | Discharge: 2012-04-02 | Disposition: A | Discharge status: Home / Self Care | Payer: Managed Care, Other (non HMO) | Source: Ambulatory Visit | Attending: Internal Medicine | Admitting: Internal Medicine

## 2012-04-02 DIAGNOSIS — M7989 Other specified soft tissue disorders: Secondary | ICD-10-CM | POA: Insufficient documentation

## 2012-04-02 DIAGNOSIS — M79609 Pain in unspecified limb: Secondary | ICD-10-CM | POA: Insufficient documentation

## 2012-04-02 DIAGNOSIS — M79671 Pain in right foot: Secondary | ICD-10-CM

## 2012-04-02 MED ORDER — TECHNETIUM TC 99M MEDRONATE IV KIT
25.0000 | PACK | Freq: Once | INTRAVENOUS | Status: AC | PRN
  Administered 2012-04-02: 25 via INTRAVENOUS

## 2012-07-23 ENCOUNTER — Ambulatory Visit (INDEPENDENT_AMBULATORY_CARE_PROVIDER_SITE_OTHER): Payer: BC Managed Care – PPO | Admitting: Internal Medicine

## 2012-07-23 ENCOUNTER — Encounter: Payer: Self-pay | Admitting: Internal Medicine

## 2012-07-23 VITALS — BP 130/74 | HR 94 | Temp 98.6°F | Wt 261.0 lb

## 2012-07-23 DIAGNOSIS — G8929 Other chronic pain: Secondary | ICD-10-CM

## 2012-07-23 DIAGNOSIS — E039 Hypothyroidism, unspecified: Secondary | ICD-10-CM

## 2012-07-23 DIAGNOSIS — J209 Acute bronchitis, unspecified: Secondary | ICD-10-CM

## 2012-07-23 DIAGNOSIS — M79609 Pain in unspecified limb: Secondary | ICD-10-CM

## 2012-07-23 LAB — URIC ACID: Uric Acid, Serum: 4.2 mg/dL (ref 4.0–7.8)

## 2012-07-23 LAB — SEDIMENTATION RATE: Sed Rate: 73 mm/hr — ABNORMAL HIGH (ref 0–22)

## 2012-07-23 MED ORDER — FLUTICASONE-SALMETEROL 250-50 MCG/DOSE IN AEPB: 1.0000 | INHALATION_SPRAY | Freq: Two times a day (BID) | RESPIRATORY_TRACT | Status: DC

## 2012-07-23 MED ORDER — TRAMADOL HCL 50 MG PO TABS: 50.0000 mg | ORAL_TABLET | Freq: Four times a day (QID) | ORAL | Status: DC | PRN

## 2012-07-23 MED ORDER — AMOXICILLIN 500 MG PO CAPS: 500.0000 mg | ORAL_CAPSULE | Freq: Three times a day (TID) | ORAL | Status: DC

## 2012-07-23 NOTE — Progress Notes (Signed)
  Subjective:    Patient ID: Dalton Murphy, male    DOB: 14-Jun-1950, 62 y.o.   MRN: [id number]  HPI  [name] began 7/10 days ago as extrinsic symptoms of itchy, watery eyes, and sneezing. This progressed to rhinitis and head/chest congestion. He developed a productive cough with yellow sputum as of 07/20/12. He questions low-grade fever associated with chills and sweats. [name] shortness of breath and wheezing with a cough. Over-the-counter Mucinex preparation and allergy medication have been partially beneficial. He denies facial pain, frontal headache, nasal purulence, otic pain, or otic discharge.      Review of Systems A Podiatrist Rxed Prednisone in Fall of 2013 w/o benefit. He continues to have chronic R foot pain. His orthopedist, Dr. Turner Daniels  prescribed tramadol and meloxicam. The tramadol appears to help  the pain more.  The pain is worse after 8 hrs of work @ [employer]  [name] an extensive evaluation of the foot pain. He wore a boot for > 9 weeks as recommended by Dr. Turner Daniels. A bone scan did not reveal a fracture that suggested chronic arthritic changes  A foot wear specialist documented asymmetry in size of the feet. Despite expensive footwear, there has been no improvement.  [name] 90 day prescriptions for his levothyroxin. His last TSH was therapeutic at 3.59 on 07/27/11. specialist     Objective:   Physical Exam General appearance:well nourished; no acute distress or increased work of breathing is present.  No  lymphadenopathy about the head, neck, or axilla noted.   Eyes: No conjunctival inflammation or lid edema is present. There is no scleral icterus.  Ears:  External ear exam shows no significant lesions or deformities.  Otoscopic examination reveals clear canals, tympanic membranes are intact bilaterally without bulging, retraction, inflammation or discharge.  Nose:  External nasal examination shows no deformity or inflammation. Nasal mucosa are  pink and moist without lesions or exudates. No septal dislocation or deviation.No obstruction to airflow.   Oral exam: Dental hygiene is good; lips and gums are healthy appearing.There is mild oropharyngeal erythema w/o exudate . Hoarse  Neck:  No deformities, thyromegaly, masses, or tenderness noted.    Heart:  Normal rate and regular rhythm. S1 and S2 normal without gallop, murmur, click, rub or other extra sounds.   Lungs:Mild diffuse rhonchi & rales .No increased work of breathing.    Extremities:  No cyanosis, edema, or clubbing  noted . There is a firm cystic-like lesion between the right great toe and  and second toe.  Skin: Warm & dry w/o jaundice or tenting.  Deep tendon reflexes are normal         Assessment & Plan:  #1 asthmatic bronchitis  #2 chronic foot pain; status post evaluation by orthopedist, podiatrist, and footwear specialist  #3 hypothyroidism  Plan: See orders and recommendations

## 2012-07-23 NOTE — Patient Instructions (Addendum)
Advair inhaler 1puff every 12 hours  for cough or wheezing.   Review and correct the record as indicated. Please share record with all medical staff seen.

## 2012-08-21 ENCOUNTER — Encounter: Payer: Self-pay | Admitting: Sports Medicine

## 2012-08-21 ENCOUNTER — Ambulatory Visit (INDEPENDENT_AMBULATORY_CARE_PROVIDER_SITE_OTHER): Payer: BC Managed Care – PPO | Admitting: Sports Medicine

## 2012-08-21 VITALS — BP 116/71 | HR 52 | Ht 71.0 in | Wt 261.0 lb

## 2012-08-21 DIAGNOSIS — M1711 Unilateral primary osteoarthritis, right knee: Secondary | ICD-10-CM | POA: Insufficient documentation

## 2012-08-21 DIAGNOSIS — G8929 Other chronic pain: Secondary | ICD-10-CM

## 2012-08-21 DIAGNOSIS — M79671 Pain in right foot: Secondary | ICD-10-CM | POA: Insufficient documentation

## 2012-08-21 DIAGNOSIS — M79609 Pain in unspecified limb: Secondary | ICD-10-CM

## 2012-08-21 MED ORDER — DICLOFENAC SODIUM 75 MG PO TBEC: 75.0000 mg | DELAYED_RELEASE_TABLET | Freq: Three times a day (TID) | ORAL | Status: DC

## 2012-08-21 MED ORDER — TRAMADOL HCL 50 MG PO TABS: 50.0000 mg | ORAL_TABLET | Freq: Four times a day (QID) | ORAL | Status: DC | PRN

## 2012-08-21 NOTE — Patient Instructions (Signed)
Very nice to meet you I am giving you a new anti-inflammatory.  Try diclofenac three times daily until the pain is better controlled.   Then go to 2 times a day for about 10 days then go down to once a day.  You can also take tylenol 325mg  3 times a day no matter what to help with arthritis pain.  Try the shoe insoles and see if they help.  I think you knee is contributing and you may need to have replacement in the near future.  Come back again in 3-4 weeks to make sure you are doing better.

## 2012-08-21 NOTE — Assessment & Plan Note (Addendum)
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 Patient did in his history had an ESR of 73 is likely due to his multiple joint arthritis and potentially underlying uric acid disease. Patient's uric acid levels was within normal limits when checked by primary care provider. Patient was fitted with sports insoles today and was given a lateral three fourths he'll wedge the patient stated was comfortable. This did fix his leg length discrepancy. I do feel that patient's osteoarthritic end stage of his right knee is likely contributing as well and likely will need surgical intervention at some time in the future. Patient was given diclofenac and we'll take 3 times a day until pain improves and then go to twice a day for 10 days and then once a day for maintenance thereafter. Patient warned the potential side effects. Patient given a refill of tramadol for breakthrough pain. Patient will return again in 3-4 weeks.
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
## 2012-08-26 ENCOUNTER — Other Ambulatory Visit: Payer: Self-pay | Admitting: Internal Medicine

## 2012-09-18 ENCOUNTER — Ambulatory Visit (INDEPENDENT_AMBULATORY_CARE_PROVIDER_SITE_OTHER): Payer: BC Managed Care – PPO | Admitting: Sports Medicine

## 2012-09-18 VITALS — BP 125/78 | Ht 71.0 in | Wt 261.0 lb

## 2012-09-18 DIAGNOSIS — M79671 Pain in right foot: Secondary | ICD-10-CM

## 2012-09-18 DIAGNOSIS — M79609 Pain in unspecified limb: Secondary | ICD-10-CM

## 2012-09-18 DIAGNOSIS — G8929 Other chronic pain: Secondary | ICD-10-CM

## 2012-09-18 DIAGNOSIS — M1711 Unilateral primary osteoarthritis, right knee: Secondary | ICD-10-CM

## 2012-09-18 MED ORDER — TRAMADOL HCL 50 MG PO TABS: 50.0000 mg | ORAL_TABLET | Freq: Three times a day (TID) | ORAL | Status: DC | PRN

## 2012-09-18 NOTE — Patient Instructions (Addendum)
Please continue diclofenac three times per day as tolerated and tramadol three times per day as needed as well  Dr. Alwyn Ren will want to check blood work after you have been on diclofenac for 4 months  When you need new green insoles- it is less expensive to order them from the company, and bring here for pad placement  Please follow up as needed  Thank you for seeing [place] today!

## 2012-09-18 NOTE — Progress Notes (Signed)
  Subjective:    Patient ID: Dalton Murphy, male    DOB: December 25, 1950, 62 y.o.   MRN: [id number]  HPI  Pt presents to clinic for f/u of rt foot pain which has improved. Taking diclofenac three times per day- helpful. He had had pain for one year but when I scanned him on ultrasound it really looked like a flare of gout. For this reason we chose to keep him on a high-dose anti-inflammatory and this does seem to be controlling his pain.  He continues to use tramadol but that seemed to work on his knee pain and not so much on the pain he had in his feet. Wearing sports insoles with lateral wedge on rt and left insole is neutral which has been comfortable.       Review of Systems     Objective:   Physical Exam  Obese white male not in any acute distress  Today his forefoot is nonpainful to palpation He has no swelling over the first second third or fourth MTP joints On last visit he could not flex his second toe and had difficulty flexion is third in the day he can do this  Standing shows that he has splaying between toes 1 and 2 with a breakdown of the entire lateral foot  He has significant genu varus from arthritis of both knees and this probably contributes to the foot breakdown  Standing with a lateral wedge on his heel does improve his foot and knee position      Assessment & Plan:

## 2012-09-18 NOTE — Assessment & Plan Note (Signed)
I think this patient likely has gout particularly since he had such a good response to the high-dose anti-inflammatory treatment  I advised him that he can continue the diclofenac 3 times a day that is high-dose we'll need to have periodic monitoring of liver function and kidney function  He will continue using the tramadol for the knee pain  Continue using a sports insole with a lateral wedge to keep the foot position less supinated

## 2012-09-18 NOTE — Assessment & Plan Note (Signed)
He has significant arthritis of the right knee and probably of the left  I offered him a trial of a compression sleeve which may give him some additional comfort and lessen swelling He wants to wait on this as he feels that it would be too hot in summer time  Continue to use a lateral wedge in his work shoes  He can recheck with me as needed

## 2012-12-23 ENCOUNTER — Other Ambulatory Visit: Payer: Self-pay | Admitting: *Deleted

## 2012-12-23 MED ORDER — DICLOFENAC SODIUM 75 MG PO TBEC: 75.0000 mg | DELAYED_RELEASE_TABLET | Freq: Three times a day (TID) | ORAL | Status: DC | PRN

## 2012-12-23 NOTE — Progress Notes (Signed)
Pt requested refill on diclofenac.  He says he takes 3 tabs on the days he works and 2 tabs on other days.  Advised he should only take 3 tabs when having severe pain, as regularly taking increased doses of NSAIDs is not ideal.   Pt expressed understanding.

## 2013-01-24 ENCOUNTER — Other Ambulatory Visit: Payer: Self-pay | Admitting: *Deleted

## 2013-01-24 DIAGNOSIS — G8929 Other chronic pain: Secondary | ICD-10-CM

## 2013-01-24 MED ORDER — TRAMADOL HCL 50 MG PO TABS: 50.0000 mg | ORAL_TABLET | Freq: Three times a day (TID) | ORAL | Status: DC | PRN

## 2013-08-19 ENCOUNTER — Other Ambulatory Visit: Payer: Self-pay

## 2013-08-19 MED ORDER — LEVOTHYROXINE SODIUM 100 MCG PO TABS: 100.0000 ug | ORAL_TABLET | Freq: Every day | ORAL | Status: DC

## 2013-09-17 ENCOUNTER — Other Ambulatory Visit: Payer: Self-pay | Admitting: Internal Medicine

## 2013-09-22 ENCOUNTER — Other Ambulatory Visit: Payer: Self-pay | Admitting: Internal Medicine

## 2013-09-23 ENCOUNTER — Ambulatory Visit (INDEPENDENT_AMBULATORY_CARE_PROVIDER_SITE_OTHER): Payer: BC Managed Care – PPO | Admitting: Internal Medicine

## 2013-09-23 ENCOUNTER — Encounter: Payer: Self-pay | Admitting: Internal Medicine

## 2013-09-23 ENCOUNTER — Ambulatory Visit: Payer: BC Managed Care – PPO | Admitting: Internal Medicine

## 2013-09-23 VITALS — BP 110/60 | HR 68 | Temp 98.1°F | Wt 257.6 lb

## 2013-09-23 DIAGNOSIS — Z86010 Personal history of colonic polyps: Secondary | ICD-10-CM

## 2013-09-23 DIAGNOSIS — E8881 Metabolic syndrome: Secondary | ICD-10-CM

## 2013-09-23 DIAGNOSIS — G8929 Other chronic pain: Secondary | ICD-10-CM

## 2013-09-23 DIAGNOSIS — E039 Hypothyroidism, unspecified: Secondary | ICD-10-CM

## 2013-09-23 DIAGNOSIS — M1711 Unilateral primary osteoarthritis, right knee: Secondary | ICD-10-CM

## 2013-09-23 DIAGNOSIS — M79671 Pain in right foot: Secondary | ICD-10-CM

## 2013-09-23 DIAGNOSIS — M79609 Pain in unspecified limb: Secondary | ICD-10-CM

## 2013-09-23 DIAGNOSIS — M1710 Unilateral primary osteoarthritis, unspecified knee: Secondary | ICD-10-CM

## 2013-09-23 DIAGNOSIS — IMO0002 Reserved for concepts with insufficient information to code with codable children: Secondary | ICD-10-CM

## 2013-09-23 MED ORDER — TRAMADOL HCL 50 MG PO TABS: 50.0000 mg | ORAL_TABLET | Freq: Three times a day (TID) | ORAL | Status: DC | PRN

## 2013-09-23 MED ORDER — LEVOTHYROXINE SODIUM 100 MCG PO TABS: ORAL_TABLET | ORAL | Status: DC

## 2013-09-23 NOTE — Progress Notes (Signed)
Subjective:    Patient ID: Dalton Murphy, male    DOB: 08-28-1950, 63 y.o.   MRN: [id number]  HPI Patient is here to monitor thyroid status & active medical issues There has been no change in the dose, brand, or mode of administration of thyroid supplement. However, he has not taken the medication since Saturday.  Last TSH  1.95 on 07/23/12    A modified heart healthy diet may be followed; but he was somewhat indefinite about this. Exercise encompasses 8 hrs of walking @ [employer] .He has severe R knee pain aggravated by being on concrete . Surgery recommended but he has defrred this "until Medicare kicks in".  To date no statin.  Low dose ASA not taken      Review of Systems  Positive for fatigue Pertinent negative or absent signs and symptoms are as follows: Constitutional: No significant change in weight; significant  sleep disorder; change in appetite. Eye: no blurred, double ,loss of vision Cardiovascular: no  racing; irregularity,chest pain, palpitations, dyspnea, or claudication. ENT/GI: no constipation; diarrhea;hoarseness;dysphagia Derm: no change in nails,hair,skin Neuro: no numbness or tingling; tremor Psych:no anxiety; depression; panic attacks Endo: no temperature intolerance to heat ,cold        Objective:   Physical Exam  Positive or pertinent findings included decreased range of motion of the neck. He has crepitus of the knees with fusiform change. There is decreased range of motion. Valgus deformity of the knees is present. He seems somewhat anxious and has difficulty focusing.  Gen.: Adequately nourished in appearance.  Moderate obesity present as per CDC GuidelinesAlert, appropriate and cooperative throughout exam.  Head: Normocephalic without obvious abnormalities Eyes: No corneal or conjunctival inflammation noted. Pupils equal round reactive to light and accommodation. Extraocular motion intact Ears: External  ear exam reveals no significant lesions or  deformities.  Nose: External nasal exam reveals no deformity or inflammation. Nasal mucosa are pink and moist. No lesions or exudates noted.   Mouth: Oral mucosa and oropharynx reveal no lesions or exudates. Teeth in good repair. Neck: No deformities, masses, or tenderness noted.  Thyroid normal. Lungs: Normal respiratory effort; chest expands symmetrically. Lungs are clear to auscultation without rales, wheezes, or increased work of breathing. Heart: Normal rate and rhythm. Normal S1 and S2. No gallop, click, or rub. No murmur. Abdomen: Protuberant.Bowel sounds normal; abdomen soft and nontender. No masses, organomegaly or hernias noted.                               Musculoskeletal/extremities: No deformity or scoliosis noted of  the thoracic or lumbar spine.   No clubbing, cyanosis, or edema noted.  Tone & strength normal. Hand joints normal  Fingernail  health good. Able to lie down & sit up w/o help. Negative SLR bilaterally Vascular: Carotid, radial artery, dorsalis pedis and  posterior tibial pulses are full and equal. No bruits present. Neurologic:  Deep tendon reflexes asymmetrical ; 1/2+ R knee       Skin: Intact without suspicious lesions or rashes.Slightly diaphoretic Lymph: No cervical, axillary, or inguinal lymphadenopathy present. Psych: Mood and affect as noted  Assessment & Plan:  See Current Assessment & Plan in Problem List under specific Diagnosis  As he been off his thyroid for 3 days; prescription will be refilled and fasting labs checked within 30 days.

## 2013-09-23 NOTE — Assessment & Plan Note (Signed)
TSH 

## 2013-09-23 NOTE — Progress Notes (Signed)
   Subjective:    Patient ID: Dalton Murphy, male    DOB: 09-04-1950, 62 y.o.   MRN: [id number]  HPI Patient is here to monitor thyroid status He has been off of the thyroid medication since Saturday 09/20/13 because of running out of refills Last TSH was 1.95 on 07/23/12 He is wearing his CPAP every night per pt    Review of Systems Positive for fatigue   Pertinent negative or absent signs and symptoms are as follows: Constitutional: No significant change in weight; sleep disorder; change in appetite. Eye: no blurred, double ,loss of vision Cardiovascular: no palpitations; racing; irregularity ENT/GI: no constipation; diarrhea;hoarseness;dysphagia Derm: no change in nails,hair,skin Neuro: no numbness or tingling; tremor Psych:no anxiety; depression; panic attacks Endo: no temperature intolerance to heat ,cold    Objective:   Physical Exam  Gen.:  Obese, well-nourished; in no acute distress, he is diaphoretic Eyes: Extraocular motion intact; no lid lag or proptosis ,nystagmus Neck: full ROM; no masses ; thyroid normal  Heart: Distant heart sounds, Normal rhythm and rate without significant murmur, gallop, or extra heart sounds Lungs: Chest clear to auscultation without rales,rales, wheezes Neuro:Deep tendon reflexes are equal and within normal limits; no tremor  Skin: Warm and dry without significant lesions or rashes; no onycholysis Lymphatic: no cervical or axillary LA Psych: Normally communicative and interactive; Attention span seems to be short.  MSK: OA in right knee, left knee is not effected.     Assessment & Plan:

## 2013-09-23 NOTE — Assessment & Plan Note (Signed)
Lipids, LFTs, TSH  

## 2013-09-23 NOTE — Assessment & Plan Note (Signed)
CBC & dif 

## 2013-09-23 NOTE — Progress Notes (Signed)
Pre visit review using our clinic review tool, if applicable. No additional management support is needed unless otherwise documented below in the visit note. 

## 2013-09-23 NOTE — Patient Instructions (Signed)
Your next office appointment will be determined based upon review of your pending labs which are due by 7/31 for additional refills. Those instructions will be transmitted to you by mail

## 2013-09-24 NOTE — Assessment & Plan Note (Signed)
Rx for Tramadol F/U with Orthopedics discussed as potential for irreversible knee DJD if not addressed

## 2013-10-06 ENCOUNTER — Other Ambulatory Visit (INDEPENDENT_AMBULATORY_CARE_PROVIDER_SITE_OTHER): Payer: BC Managed Care – PPO

## 2013-10-06 DIAGNOSIS — Z86010 Personal history of colonic polyps: Secondary | ICD-10-CM

## 2013-10-06 DIAGNOSIS — E039 Hypothyroidism, unspecified: Secondary | ICD-10-CM

## 2013-10-06 DIAGNOSIS — E8881 Metabolic syndrome: Secondary | ICD-10-CM

## 2013-10-06 LAB — BASIC METABOLIC PANEL
BUN: 18 mg/dL (ref 6–23)
CALCIUM: 8.6 mg/dL (ref 8.4–10.5)
CO2: 25 mEq/L (ref 19–32)
Chloride: 107 mEq/L (ref 96–112)
Creatinine, Ser: 0.6 mg/dL (ref 0.4–1.5)
GFR: 147.42 mL/min (ref >60.00)
Glucose, Bld: 97 mg/dL (ref 70–99)
POTASSIUM: 4.2 meq/L (ref 3.5–5.1)
SODIUM: 139 meq/L (ref 135–145)

## 2013-10-06 LAB — CBC WITH DIFFERENTIAL/PLATELET
BASOS ABS: 0 10*3/uL (ref 0.0–0.1)
Basophils Relative: 0.2 % (ref 0.0–3.0)
EOS PCT: 1.9 % (ref 0.0–5.0)
Eosinophils Absolute: 0.2 10*3/uL (ref 0.0–0.7)
HCT: 41.7 % (ref 39.0–52.0)
HEMOGLOBIN: 14.4 g/dL (ref 13.0–17.0)
LYMPHS PCT: 20.9 % (ref 12.0–46.0)
Lymphs Abs: 2.2 10*3/uL (ref 0.7–4.0)
MCHC: 34.5 g/dL (ref 30.0–36.0)
MCV: 85 fl (ref 78.0–100.0)
MONOS PCT: 6.1 % (ref 3.0–12.0)
Monocytes Absolute: 0.6 10*3/uL (ref 0.1–1.0)
Neutro Abs: 7.4 10*3/uL (ref 1.4–7.7)
Neutrophils Relative %: 70.9 % (ref 43.0–77.0)
PLATELETS: 170 10*3/uL (ref 150.0–400.0)
RBC: 4.91 Mil/uL (ref 4.22–5.81)
RDW: 13.7 % (ref 11.5–15.5)
WBC: 10.5 10*3/uL (ref 4.0–10.5)

## 2013-10-06 LAB — LIPID PANEL
CHOLESTEROL: 135 mg/dL (ref 0–200)
HDL: 26.1 mg/dL — ABNORMAL LOW (ref >39.00)
LDL Cholesterol: 41 mg/dL (ref 0–99)
NonHDL: 108.9
Total CHOL/HDL Ratio: 5
Triglycerides: 341 mg/dL — ABNORMAL HIGH (ref 0.0–149.0)
VLDL: 68.2 mg/dL — ABNORMAL HIGH (ref 0.0–40.0)

## 2013-10-06 LAB — HEPATIC FUNCTION PANEL
ALK PHOS: 72 U/L (ref 39–117)
ALT: 36 U/L (ref 0–53)
AST: 23 U/L (ref 0–37)
Albumin: 3.8 g/dL (ref 3.5–5.2)
BILIRUBIN TOTAL: 1.9 mg/dL — AB (ref 0.2–1.2)
Bilirubin, Direct: 0.2 mg/dL (ref 0.0–0.3)
Total Protein: 6.8 g/dL (ref 6.0–8.3)

## 2013-10-06 LAB — HEMOGLOBIN A1C: HEMOGLOBIN A1C: 6 % (ref 4.6–6.5)

## 2013-10-06 LAB — TSH: TSH: 0.03 u[IU]/mL — AB (ref 0.35–4.50)

## 2013-10-07 ENCOUNTER — Other Ambulatory Visit: Payer: Self-pay | Admitting: Internal Medicine

## 2013-10-07 DIAGNOSIS — E039 Hypothyroidism, unspecified: Secondary | ICD-10-CM

## 2013-10-07 DIAGNOSIS — E785 Hyperlipidemia, unspecified: Secondary | ICD-10-CM

## 2013-10-10 ENCOUNTER — Telehealth: Payer: Self-pay | Admitting: Internal Medicine

## 2013-10-10 DIAGNOSIS — E039 Hypothyroidism, unspecified: Secondary | ICD-10-CM

## 2013-10-10 MED ORDER — LEVOTHYROXINE SODIUM 100 MCG PO TABS: ORAL_TABLET | ORAL | Status: DC

## 2013-10-10 NOTE — Telephone Encounter (Signed)
Patient's wife is calling to request a prescription for the patient's levothyroxine (SYNTHROID, LEVOTHROID) 100 MCG. Wants sent to [facility] on [facility]. Please advise.

## 2013-11-17 ENCOUNTER — Other Ambulatory Visit: Payer: Self-pay | Admitting: *Deleted

## 2013-11-17 MED ORDER — DICLOFENAC SODIUM 75 MG PO TBEC: 75.0000 mg | DELAYED_RELEASE_TABLET | Freq: Three times a day (TID) | ORAL | Status: DC | PRN

## 2013-11-21 ENCOUNTER — Other Ambulatory Visit: Payer: Self-pay | Admitting: Internal Medicine

## 2013-11-21 NOTE — Telephone Encounter (Signed)
OK X1 

## 2013-11-21 NOTE — Telephone Encounter (Signed)
09/23/13 last office note and when medication script was printed

## 2013-12-27 IMAGING — NM NM BONE 3 PHASE
2 series · 12 of 12 positions shown · non-contrast
Comparison: None.

CLINICAL DATA: Leg swelling.  Right foot pain and swelling.

NUCLEAR MEDICINE THREE PHASE BONE SCAN
TECHNIQUE: Radionuclide angiographic images, immediate static
blood pool images, and 3-hour delayed static images were obtained
after intravenous injection of radiopharmaceutical.
Radiopharmaceutical: [id number] DUALSZKY HIRSCHBERG TECHNETIUM TC 99M
MEDRONATE IV KIT

[Series 1: fl flow and static · 4.75mm/px · 6 of 40 frames shown (1 of 2)]
[frame 4/40]
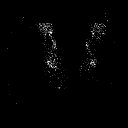
[frame 10/40]
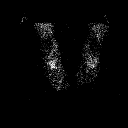
[frame 17/40]
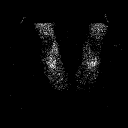
[frame 24/40]
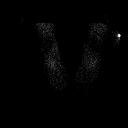
[frame 30/40]
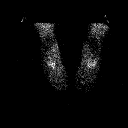
[frame 37/40]
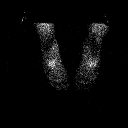

[Series 1: fl flow and static · 4.75mm/px · 6 of 40 frames shown (2 of 2)]
[frame 4/40]
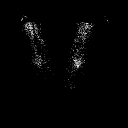
[frame 10/40]
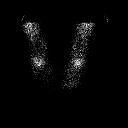
[frame 17/40]
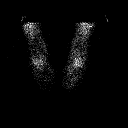
[frame 24/40]
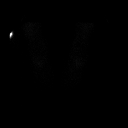
[frame 30/40]
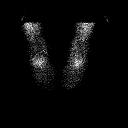
[frame 37/40]
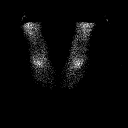

[12 of 12 positions shown; findings below may reference images not displayed]

FINDINGS: Relatively symmetric radiotracer activity to both feet.

On the blood pool phase portion of the examination there is a
relatively symmetric radiotracer uptake to the tarsometatarsal
joints of both feet.

On the delayed phase images there is symmetric radiotracer uptake
localizing to the tarsal-metatarsal regions of both feet.
IMPRESSION: 1.  No abnormal asymmetric increased radiotracer uptake identified.
2.  Relatively symmetric radiotracer uptake on the blood pool and
delayed phase images localizing to the tarsal-metatarsal region of
both feet.  Findings most likely reflect chronic arthropathic
changes.

## 2014-01-01 ENCOUNTER — Other Ambulatory Visit: Payer: Self-pay | Admitting: Internal Medicine

## 2014-01-01 NOTE — Telephone Encounter (Signed)
Tramadol has been called to pharmacy  

## 2014-01-01 NOTE — Telephone Encounter (Signed)
#  90 q 8-12 hrs prn only

## 2014-01-01 NOTE — Telephone Encounter (Signed)
Last office visit 09/23/13 Last labs 09/2013 Medication last phoned to pharmacy on 11/21/13 #100 no refills

## 2014-01-04 ENCOUNTER — Other Ambulatory Visit: Payer: Self-pay | Admitting: Internal Medicine

## 2014-01-09 ENCOUNTER — Other Ambulatory Visit: Payer: Self-pay | Admitting: Internal Medicine

## 2014-01-14 ENCOUNTER — Other Ambulatory Visit: Payer: Self-pay | Admitting: *Deleted

## 2014-01-14 MED ORDER — DICLOFENAC SODIUM 75 MG PO TBEC: 75.0000 mg | DELAYED_RELEASE_TABLET | Freq: Three times a day (TID) | ORAL | Status: DC | PRN

## 2014-01-17 ENCOUNTER — Other Ambulatory Visit: Payer: Self-pay | Admitting: Internal Medicine

## 2014-02-23 ENCOUNTER — Other Ambulatory Visit: Payer: Self-pay | Admitting: *Deleted

## 2014-02-23 MED ORDER — DICLOFENAC SODIUM 75 MG PO TBEC: 75.0000 mg | DELAYED_RELEASE_TABLET | Freq: Three times a day (TID) | ORAL | Status: DC | PRN

## 2014-02-27 ENCOUNTER — Other Ambulatory Visit: Payer: Self-pay | Admitting: Sports Medicine

## 2014-03-03 ENCOUNTER — Other Ambulatory Visit: Payer: Self-pay | Admitting: Sports Medicine

## 2014-03-03 ENCOUNTER — Other Ambulatory Visit: Payer: Self-pay | Admitting: Internal Medicine

## 2014-03-03 NOTE — Telephone Encounter (Signed)
Tramadol has been called to [facility]  

## 2014-03-03 NOTE — Telephone Encounter (Signed)
OK X1 

## 2014-03-03 NOTE — Telephone Encounter (Signed)
09/23/13 last office visit  01/01/14 #90 called to pharmacy

## 2014-03-10 ENCOUNTER — Ambulatory Visit (INDEPENDENT_AMBULATORY_CARE_PROVIDER_SITE_OTHER): Payer: BC Managed Care – PPO | Admitting: Sports Medicine

## 2014-03-10 ENCOUNTER — Encounter: Payer: Self-pay | Admitting: Sports Medicine

## 2014-03-10 VITALS — BP 151/71 | Ht 71.0 in | Wt 240.0 lb

## 2014-03-10 DIAGNOSIS — M79671 Pain in right foot: Secondary | ICD-10-CM

## 2014-03-10 DIAGNOSIS — M129 Arthropathy, unspecified: Secondary | ICD-10-CM | POA: Diagnosis not present

## 2014-03-10 DIAGNOSIS — M1711 Unilateral primary osteoarthritis, right knee: Secondary | ICD-10-CM

## 2014-03-10 MED ORDER — TRAMADOL HCL 50 MG PO TABS: 100.0000 mg | ORAL_TABLET | Freq: Two times a day (BID) | ORAL | Status: DC | PRN

## 2014-03-10 MED ORDER — DICLOFENAC SODIUM 75 MG PO TBEC: 75.0000 mg | DELAYED_RELEASE_TABLET | Freq: Three times a day (TID) | ORAL | Status: DC

## 2014-03-10 NOTE — Assessment & Plan Note (Signed)
He still seems to have evidence that would suggest intermittent gout  Continue trying to lose weight  I made him new insoles today  I renewed his Voltaren and his tramadol Use the Voltaren regularly-2 times daily and if he has a lot of pain he can take a third  Use of tramadol only as needed  I advised him to continue to monitor his renal function and liver function with Dr. Linna Darner both of these looked all right last year with the exception of a bilirubin that was 1.9  I gave him information on getting additional insoles from the Internet  He can see me in 6 months

## 2014-03-10 NOTE — Assessment & Plan Note (Signed)
The lateral wedge seem to help so we added this to his current insoles  I gave him additional lateral wedges season other shoes  I strongly suggested continuing to work on doing some quadriceps activity even they may be fatigued after work  This gives him the best chance of keeping the knee from becoming more painful or worsening  We can try a compression sleeve in the future if the knee pain is worse

## 2014-03-10 NOTE — Progress Notes (Signed)
Patient ID: Dalton Murphy, male   DOB: 10-05-1950, 63 y.o.   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 Linna Darner for hypothyroidism  Comes back today because  sports insole is wearing down and his foot pain is returning  Occupation requires him to be on his feet during the day  Physical examination Mildly obese white male in no acute distress BP 151/71 mmHg  Ht 5\' 11"  (1.803 m)  Wt 240 lb (108.863 kg)  BMI 33.49 kg/m2  Right knee shows significant varus shift Changes of chronic DJD No warmth today but I probable small effusion  Both knees show a limited flexion to less than 130  Right foot shows some chronic changes at the first MTP joint that may be degenerative There is mild valgus shift Left second MTP is tender but not red or swollen Significant limitation of motion of the second MTP The first MTP has mild limitation of motion Moderate loss of the longitudinal arch

## 2014-07-27 ENCOUNTER — Other Ambulatory Visit: Payer: Self-pay | Admitting: Sports Medicine

## 2014-08-20 ENCOUNTER — Other Ambulatory Visit: Payer: Self-pay | Admitting: Sports Medicine

## 2014-08-23 ENCOUNTER — Other Ambulatory Visit: Payer: Self-pay | Admitting: Sports Medicine

## 2014-09-11 ENCOUNTER — Other Ambulatory Visit: Payer: Self-pay | Admitting: Sports Medicine

## 2014-09-15 ENCOUNTER — Other Ambulatory Visit: Payer: Self-pay | Admitting: Sports Medicine

## 2014-09-16 ENCOUNTER — Other Ambulatory Visit: Payer: Self-pay | Admitting: *Deleted

## 2014-09-16 ENCOUNTER — Other Ambulatory Visit: Payer: Self-pay | Admitting: Sports Medicine

## 2014-09-16 MED ORDER — TRAMADOL HCL 50 MG PO TABS: ORAL_TABLET | ORAL | Status: DC

## 2014-10-20 ENCOUNTER — Encounter (INDEPENDENT_AMBULATORY_CARE_PROVIDER_SITE_OTHER): Payer: Self-pay

## 2014-10-20 ENCOUNTER — Ambulatory Visit (INDEPENDENT_AMBULATORY_CARE_PROVIDER_SITE_OTHER): Payer: BLUE CROSS/BLUE SHIELD | Admitting: Sports Medicine

## 2014-10-20 ENCOUNTER — Encounter: Payer: Self-pay | Admitting: Sports Medicine

## 2014-10-20 VITALS — BP 150/77 | HR 70 | Ht 71.0 in | Wt 250.0 lb

## 2014-10-20 DIAGNOSIS — M79671 Pain in right foot: Secondary | ICD-10-CM

## 2014-10-20 DIAGNOSIS — M1711 Unilateral primary osteoarthritis, right knee: Secondary | ICD-10-CM

## 2014-10-20 DIAGNOSIS — M129 Arthropathy, unspecified: Secondary | ICD-10-CM

## 2014-10-20 MED ORDER — TRAMADOL HCL 50 MG PO TABS: ORAL_TABLET | ORAL | Status: DC

## 2014-10-20 NOTE — Patient Instructions (Signed)
Diclofenac: Take minimum 2 times a day, can take up to 3 times a day if needed  Tramadol: Take 2 tablets every 12 hours as needed for breakthrough pain.

## 2014-10-20 NOTE — Assessment & Plan Note (Addendum)
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 At this time we will continue the diclofenac 2 tablets daily (can increase to 3 if needed) and tramadol (up to 4 tablets a day for breakthrough). Tramadol rx #120 given today. F/u 6 months. If still not doing well could consider more gout-specific medication like allopurinol or colchicine.

## 2014-10-20 NOTE — Assessment & Plan Note (Signed)
Currently stable. He continues to want to delay surgery until he can at least get onto medicare, longer if possible. Not sure how much improvement if any he would get with steroid injection but this could be an option for him. F/u 6 months.

## 2014-10-20 NOTE — Progress Notes (Signed)
  Dalton Murphy - 64 y.o. male MRN [id number]  Date of birth: [birth year]  SUBJECTIVE:  Including CC & ROS.   Dalton Murphy is a 64 y.o. male presenting for follow up of right toe, right knee pain. Pt reports pain progressively getting worse and not as controlled with medicines, afraid he is not getting enough tramadol to help. He is currently taking up to 4 tramadol a day in addition to diclofenac. The amount of pain varies between the right knee and right toe, sometimes each hurting more than the other. His right toe pain he says has starting to travel into the other toes of his foot. He continues to drink alcohol but does not necessarily see his foot pain get worse with drinking. In the past few months he has also noted a sharp pain in his left thumb that occurred randomly without any known trauma or stress to the area. He denies any fevers, he does occasionally get heart burn but not consistently.   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  Lots of standing will make either knee or foot pain worse.   HISTORY: Past Medical, Surgical, Social, and Family History Reviewed & Updated per EMR. Pertinent Historical Findings include: Right knee OA, hypothyroidism  PHYSICAL EXAM:  VS: BP:(!) 150/77 mmHg  HR:70bpm  TEMP: ( )  RESP:   HT:5\' 11"  (180.3 cm)   WT:250 lb (113.399 kg)  BMI:34.9 PHYSICAL EXAM: General: NAD Neuro: alert and oriented x 3 MSK: Right foot: red nodule noted medial aspect 1st MTP joint, not warm but is tender. Right toe ROM is full. Right knee: varus malformation. Moderate swelling compared to the left. Crepitus noted with passive ROM. Flexion limited to 90 degrees.  Left hand: multiple nodules noted on left DIP and PIP joints, not swollen, not erythematous  ASSESSMENT & PLAN: See problem based charting & AVS for pt instructions.

## 2014-10-24 ENCOUNTER — Other Ambulatory Visit: Payer: Self-pay | Admitting: Internal Medicine

## 2014-10-27 ENCOUNTER — Other Ambulatory Visit: Payer: Self-pay | Admitting: Emergency Medicine

## 2014-10-27 MED ORDER — LEVOTHYROXINE SODIUM 100 MCG PO TABS: ORAL_TABLET | ORAL | Status: DC

## 2014-12-04 ENCOUNTER — Other Ambulatory Visit: Payer: Self-pay | Admitting: Emergency Medicine

## 2014-12-04 MED ORDER — LEVOTHYROXINE SODIUM 100 MCG PO TABS: ORAL_TABLET | ORAL | Status: DC

## 2014-12-04 NOTE — Telephone Encounter (Signed)
CPE has been scheduled for 8/29. 30 day supply has been sent in to pharm for synthroid

## 2014-12-14 ENCOUNTER — Other Ambulatory Visit (INDEPENDENT_AMBULATORY_CARE_PROVIDER_SITE_OTHER): Payer: BLUE CROSS/BLUE SHIELD

## 2014-12-14 ENCOUNTER — Encounter: Payer: Self-pay | Admitting: Internal Medicine

## 2014-12-14 ENCOUNTER — Ambulatory Visit (INDEPENDENT_AMBULATORY_CARE_PROVIDER_SITE_OTHER): Payer: BLUE CROSS/BLUE SHIELD | Admitting: Internal Medicine

## 2014-12-14 ENCOUNTER — Encounter: Payer: Self-pay | Admitting: Gastroenterology

## 2014-12-14 VITALS — BP 140/80 | HR 60 | Temp 97.7°F | Resp 18 | Ht 71.0 in | Wt 270.0 lb

## 2014-12-14 DIAGNOSIS — Z86010 Personal history of colonic polyps: Secondary | ICD-10-CM

## 2014-12-14 DIAGNOSIS — Z0000 Encounter for general adult medical examination without abnormal findings: Secondary | ICD-10-CM

## 2014-12-14 DIAGNOSIS — Z23 Encounter for immunization: Secondary | ICD-10-CM | POA: Diagnosis not present

## 2014-12-14 DIAGNOSIS — R7989 Other specified abnormal findings of blood chemistry: Secondary | ICD-10-CM | POA: Diagnosis not present

## 2014-12-14 DIAGNOSIS — Z0189 Encounter for other specified special examinations: Secondary | ICD-10-CM

## 2014-12-14 LAB — BASIC METABOLIC PANEL
BUN: 19 mg/dL (ref 6–23)
CALCIUM: 9.2 mg/dL (ref 8.4–10.5)
CO2: 25 meq/L (ref 19–32)
CREATININE: 0.86 mg/dL (ref 0.40–1.50)
Chloride: 107 mEq/L (ref 96–112)
GFR: 95.08 mL/min (ref >60.00)
GLUCOSE: 116 mg/dL — AB (ref 70–99)
Potassium: 4.5 mEq/L (ref 3.5–5.1)
SODIUM: 140 meq/L (ref 135–145)

## 2014-12-14 LAB — CBC WITH DIFFERENTIAL/PLATELET
BASOS ABS: 0 10*3/uL (ref 0.0–0.1)
Basophils Relative: 0.3 % (ref 0.0–3.0)
Eosinophils Absolute: 0.1 10*3/uL (ref 0.0–0.7)
Eosinophils Relative: 1.9 % (ref 0.0–5.0)
HCT: 42.3 % (ref 39.0–52.0)
Hemoglobin: 14.4 g/dL (ref 13.0–17.0)
LYMPHS ABS: 1.8 10*3/uL (ref 0.7–4.0)
Lymphocytes Relative: 27.5 % (ref 12.0–46.0)
MCHC: 33.9 g/dL (ref 30.0–36.0)
MCV: 87.1 fl (ref 78.0–100.0)
MONO ABS: 0.3 10*3/uL (ref 0.1–1.0)
Monocytes Relative: 5.2 % (ref 3.0–12.0)
NEUTROS ABS: 4.2 10*3/uL (ref 1.4–7.7)
NEUTROS PCT: 65.1 % (ref 43.0–77.0)
PLATELETS: 159 10*3/uL (ref 150.0–400.0)
RBC: 4.86 Mil/uL (ref 4.22–5.81)
RDW: 13.4 % (ref 11.5–15.5)
WBC: 6.5 10*3/uL (ref 4.0–10.5)

## 2014-12-14 LAB — TSH: TSH: 0.12 u[IU]/mL — AB (ref 0.35–4.50)

## 2014-12-14 LAB — HEPATIC FUNCTION PANEL
ALBUMIN: 4.2 g/dL (ref 3.5–5.2)
ALK PHOS: 81 U/L (ref 39–117)
ALT: 65 U/L — ABNORMAL HIGH (ref 0–53)
AST: 34 U/L (ref 0–37)
BILIRUBIN TOTAL: 1 mg/dL (ref 0.2–1.2)
Bilirubin, Direct: 0.2 mg/dL (ref 0.0–0.3)
Total Protein: 7.3 g/dL (ref 6.0–8.3)

## 2014-12-14 LAB — LIPID PANEL
Cholesterol: 177 mg/dL (ref 0–200)
HDL: 35.5 mg/dL — ABNORMAL LOW (ref >39.00)
NonHDL: 141.01
Total CHOL/HDL Ratio: 5
Triglycerides: 243 mg/dL — ABNORMAL HIGH (ref 0.0–149.0)
VLDL: 48.6 mg/dL — AB (ref 0.0–40.0)

## 2014-12-14 LAB — URIC ACID: URIC ACID, SERUM: 5.9 mg/dL (ref 4.0–7.8)

## 2014-12-14 LAB — LDL CHOLESTEROL, DIRECT: Direct LDL: 116 mg/dL

## 2014-12-14 NOTE — Patient Instructions (Addendum)
  Your next office appointment will be determined based upon review of your pending labs  and  xrays  Those written interpretation of the lab results and instructions will be transmitted to you by mail for your records.  Critical results will be called.   Followup as needed for any active or acute issue. Please report any significant change in your symptoms.  Colonoscopy OVERdue because of tubular adenomas.

## 2014-12-14 NOTE — Progress Notes (Signed)
Pre visit review using our clinic review tool, if applicable. No additional management support is needed unless otherwise documented below in the visit note. 

## 2014-12-14 NOTE — Progress Notes (Signed)
   Subjective:    Patient ID: Dalton Murphy, male    DOB: 21-Aug-1950, 64 y.o.   MRN: [id number]  HPI The patient is here to assess status of active health conditions.  PMH, FH, & Social History reviewed & updated.No change in [place] as recorded.  He is on a modified heart healthy diet because of gout. [name] physically active on his job and does yardwork without associated cardio pulmonary symptoms. He has no regular exercise program.  He has been taking his Synthroid with other medications; he takes 1 pill a day rather than 1 daily and a half on Wednesday.  He had a colonoscopy in September 2007. 3 tubular adenomas and 3 hyperplastic polyps were found. He was to return 2012 but has not done so. He denies any active GI symptoms.  Review of Systems  He had been scheduled for total knee surgery on the right but deferred this. He is waiting to have this done once he is on Medicare. He has occasional exertional dyspnea; occasional edema; occasional loose stool; occasional urgency; and heat intolerance.    Objective:   Physical Exam   Pertinent or positive findings include: Pattern alopecia is present. There is decreased range of motion of the cervical spine. Abdomen is protuberant & massive. Ventral hernia is present. Valgus deformity of the knees present with marked crepitus of the knees bilaterally. No clinical effusion present. Small inclusion cysts over the upper back.. Varices in the left scrotum. Prostate is not enlarged; no induration or nodularity present.  General appearance :adequately nourished; in no distress.  Eyes: No conjunctival inflammation or scleral icterus is present.  Oral exam:  Lips and gums are healthy appearing.There is no oropharyngeal erythema or exudate noted. Dental hygiene is good.  Heart:  Normal rate and regular rhythm. S1 and S2 normal without gallop, murmur, click, rub or other extra sounds    Lungs:Chest clear to auscultation; no wheezes, rhonchi,rales ,or  rubs present.No increased work of breathing.   Abdomen: bowel sounds normal, soft and non-tender without masses, or organomegaly noted.  No guarding or rebound.   Vascular : all pulses equal ; no bruits present.  Skin:Warm & dry.  Intact without suspicious lesions or rashes ; no tenting or jaundice   Lymphatic: No lymphadenopathy is noted about the head, neck, axilla, or inguinal areas.   Neuro: Strength, tone & DTRs normal.           Assessment & Plan:  #1 comprehensive physical exam; no acute findings  Plan: see Orders  & Recommendations

## 2014-12-15 ENCOUNTER — Other Ambulatory Visit (INDEPENDENT_AMBULATORY_CARE_PROVIDER_SITE_OTHER): Payer: BLUE CROSS/BLUE SHIELD

## 2014-12-15 ENCOUNTER — Other Ambulatory Visit: Payer: Self-pay | Admitting: Internal Medicine

## 2014-12-15 DIAGNOSIS — R748 Abnormal levels of other serum enzymes: Secondary | ICD-10-CM

## 2014-12-15 DIAGNOSIS — E039 Hypothyroidism, unspecified: Secondary | ICD-10-CM

## 2014-12-15 DIAGNOSIS — R739 Hyperglycemia, unspecified: Secondary | ICD-10-CM

## 2014-12-15 LAB — HEMOGLOBIN A1C: Hgb A1c MFr Bld: 5.8 % (ref 4.6–6.5)

## 2015-01-12 ENCOUNTER — Other Ambulatory Visit: Payer: Self-pay | Admitting: Internal Medicine

## 2015-01-13 ENCOUNTER — Other Ambulatory Visit: Payer: Self-pay | Admitting: Emergency Medicine

## 2015-01-13 MED ORDER — LEVOTHYROXINE SODIUM 100 MCG PO TABS: ORAL_TABLET | ORAL | Status: DC

## 2015-02-15 ENCOUNTER — Ambulatory Visit (AMBULATORY_SURGERY_CENTER): Payer: Self-pay | Admitting: *Deleted

## 2015-02-15 VITALS — Ht 71.0 in | Wt 271.0 lb

## 2015-02-15 DIAGNOSIS — Z86010 Personal history of colonic polyps: Secondary | ICD-10-CM

## 2015-02-15 NOTE — Progress Notes (Signed)
Denies allergies to eggs or soy products. Denies complications with sedation or anesthesia. Denies O2 use. Denies use of diet or weight loss medications.  Emmi instructions given for colonoscopy.  

## 2015-03-01 ENCOUNTER — Encounter: Payer: Self-pay | Admitting: Gastroenterology

## 2015-03-01 ENCOUNTER — Ambulatory Visit (AMBULATORY_SURGERY_CENTER): Payer: BLUE CROSS/BLUE SHIELD | Admitting: Gastroenterology

## 2015-03-01 VITALS — BP 124/76 | HR 60 | Temp 97.5°F | Resp 22 | Ht 71.0 in | Wt 271.0 lb

## 2015-03-01 DIAGNOSIS — D123 Benign neoplasm of transverse colon: Secondary | ICD-10-CM

## 2015-03-01 DIAGNOSIS — Z86010 Personal history of colonic polyps: Secondary | ICD-10-CM

## 2015-03-01 DIAGNOSIS — D125 Benign neoplasm of sigmoid colon: Secondary | ICD-10-CM

## 2015-03-01 MED ORDER — SODIUM CHLORIDE 0.9 % IV SOLN: 500.0000 mL | INTRAVENOUS | Status: DC

## 2015-03-01 NOTE — Progress Notes (Signed)
Report to PACU, RN, vss, BBS= Clear.  

## 2015-03-01 NOTE — Op Note (Signed)
[place]  [address] [place], [id number]   COLONOSCOPY PROCEDURE REPORT  PATIENT: Dalton Murphy, Dalton Murphy  MR#: [id number] BIRTHDATE: 15-Feb-1951 , 64  yrs. old GENDER: male ENDOSCOPIST: Ladene Artist, MD, [facility] PROCEDURE DATE:  03/01/2015 PROCEDURE:   Colonoscopy, surveillance and Colonoscopy with snare polypectomy First Screening Colonoscopy - Avg.  risk and is 50 yrs.  old or older - No.  Prior Negative Screening - Now for repeat screening. N/A  History of Adenoma - Now for follow-up colonoscopy & has been > or = to 3 yrs.  Yes hx of adenoma.  Has been 3 or more years since last colonoscopy.  Polyps removed today? Yes ASA CLASS:   Class II INDICATIONS:Surveillance due to prior colonic neoplasia and PH Colon Adenoma. MEDICATIONS: Monitored anesthesia care and Propofol 300 mg IV DESCRIPTION OF PROCEDURE:   After the risks benefits and alternatives of the procedure were thoroughly explained, informed consent was obtained.  The digital rectal exam revealed no abnormalities of the rectum.   The LB [id number]  endoscope was introduced through the anus and advanced to the cecum, which was identified by both the appendix and ileocecal valve. No adverse events experienced.   The quality of the prep was good.  (MiraLax was used)  The instrument was then slowly withdrawn as the colon was fully examined. Estimated blood loss is zero unless otherwise noted in this procedure report.    COLON FINDINGS: Three sessile polyps measuring 5-7 mm in size were found in the sigmoid colon (2) and transverse colon (1). Polypectomies were performed with a cold snare.  The resection was complete, the polyp tissue was completely retrieved and sent to histology.   There was moderate diverticulosis noted in the sigmoid colon, descending colon, and transverse colon.   The examination was otherwise normal.  Retroflexed views revealed internal Grade I hemorrhoids. The time to cecum =  1.3 Withdrawal time = 12.9   The scope was withdrawn and the procedure completed. COMPLICATIONS: There were no immediate complications.  ENDOSCOPIC IMPRESSION: 1.   Three sessile polyps in the sigmoid colon and transverse colon; polypectomies performed with a cold snare 2.   Moderate diverticulosis in the sigmoid colon, descending colon, and transverse colon 3.   Grade l internal hemorrhoids  RECOMMENDATIONS: 1.  Await pathology results 2.  High fiber diet with liberal fluid intake. 3.  Repeat colonoscopy in 5 years if polyp(s) adenomatous; otherwise 10 years  eSigned:  Ladene Artist, MD, [facility] 03/01/2015 9:03 AM

## 2015-03-01 NOTE — Progress Notes (Signed)
Called to room to assist during endoscopic procedure.  Patient ID and intended procedure confirmed with present staff. Received instructions for my participation in the procedure from the performing physician.  

## 2015-03-01 NOTE — Patient Instructions (Signed)
YOU HAD AN ENDOSCOPIC PROCEDURE TODAY AT THE [facility]:   Refer to the procedure report that was given to you for any specific questions about what was found during the examination.  If the procedure report does not answer your questions, please call your gastroenterologist to clarify.  If you requested that your care partner not be given the details of your procedure findings, then the procedure report has been included in a sealed envelope for you to review at your convenience later.  YOU SHOULD EXPECT: Some feelings of bloating in the abdomen. Passage of more gas than usual.  Walking can help get rid of the air that was put into your GI tract during the procedure and reduce the bloating. If you had a lower endoscopy (such as a colonoscopy or flexible sigmoidoscopy) you may notice spotting of blood in your stool or on the toilet paper. If you underwent a bowel prep for your procedure, you may not have a normal bowel movement for a few days.  Please Note:  You might notice some irritation and congestion in your nose or some drainage.  This is from the oxygen used during your procedure.  There is no need for concern and it should clear up in a day or so.  SYMPTOMS TO REPORT IMMEDIATELY:   Following lower endoscopy (colonoscopy or flexible sigmoidoscopy):  Excessive amounts of blood in the stool  Significant tenderness or worsening of abdominal pains  Swelling of the abdomen that is new, acute  Fever of 100F or higher   For urgent or emergent issues, a gastroenterologist can be reached at any hour by calling [phone].   DIET: Your first meal following the procedure should be a small meal and then it is ok to progress to your normal diet. Heavy or fried foods are harder to digest and may make you feel nauseous or bloated.  Likewise, meals heavy in dairy and vegetables can increase bloating.  Drink plenty of fluids but you should avoid alcoholic beverages for 24 hours. Try to  increase the fiber in your diet.  ACTIVITY:  You should plan to take it easy for the rest of today and you should NOT DRIVE or use heavy machinery until tomorrow (because of the sedation medicines used during the test).    FOLLOW UP: Our staff will call the number listed on your records the next business day following your procedure to check on you and address any questions or concerns that you may have regarding the information given to you following your procedure. If we do not reach you, we will leave a message.  However, if you are feeling well and you are not experiencing any problems, there is no need to return our call.  We will assume that you have returned to your regular daily activities without incident.  If any biopsies were taken you will be contacted by phone or by letter within the next 1-3 weeks.  Please call us at [phone] if you have not heard about the biopsies in 3 weeks.    SIGNATURES/CONFIDENTIALITY: You and/or your care partner have signed paperwork which will be entered into your electronic medical record.  These signatures attest to the fact that that the information above on your After Visit Summary has been reviewed and is understood.  Full responsibility of the confidentiality of this discharge information lies with you and/or your care-partner.  Read all of the handouts given to you by your recovery room nurse. 

## 2015-03-02 ENCOUNTER — Telehealth: Payer: Self-pay | Admitting: Emergency Medicine

## 2015-03-02 NOTE — Telephone Encounter (Signed)
  Follow up Call-  Call back number 03/01/2015  Post procedure Call Back phone  # [phone]   Permission to leave phone message Yes     Patient questions:  Do you have a fever, pain , or abdominal swelling? No. Pain Score  0 *  Have you tolerated food without any problems? Yes.    Have you been able to return to your normal activities? Yes.    Do you have any questions about your discharge instructions: Diet   No. Medications  No. Follow up visit  No.  Do you have questions or concerns about your Care? No.  Actions: * If pain score is 4 or above: No action needed, pain <4.

## 2015-03-09 ENCOUNTER — Encounter: Payer: Self-pay | Admitting: Gastroenterology

## 2015-03-27 ENCOUNTER — Other Ambulatory Visit: Payer: Self-pay | Admitting: Sports Medicine

## 2015-04-27 ENCOUNTER — Other Ambulatory Visit: Payer: Self-pay | Admitting: Sports Medicine

## 2015-05-12 ENCOUNTER — Other Ambulatory Visit: Payer: Self-pay | Admitting: *Deleted

## 2015-05-12 MED ORDER — TRAMADOL HCL 50 MG PO TABS: ORAL_TABLET | ORAL | Status: DC

## 2015-05-14 ENCOUNTER — Other Ambulatory Visit: Payer: Self-pay | Admitting: Sports Medicine

## 2015-06-24 ENCOUNTER — Other Ambulatory Visit: Payer: Self-pay | Admitting: *Deleted

## 2015-06-24 ENCOUNTER — Other Ambulatory Visit: Payer: Self-pay | Admitting: Sports Medicine

## 2015-06-24 MED ORDER — TRAMADOL HCL 50 MG PO TABS: ORAL_TABLET | ORAL | Status: DC

## 2015-07-12 ENCOUNTER — Other Ambulatory Visit: Payer: Self-pay | Admitting: Internal Medicine

## 2015-07-12 NOTE — Telephone Encounter (Signed)
Left message on patient's cell phone asking him to call back to schedule appt/get established with new pcp----let Mai Longnecker know when appt is made so that synthroid rx refill can be sent to pharm

## 2015-07-12 NOTE — Telephone Encounter (Signed)
Pt called in and made a transfer of care appt for may

## 2015-07-26 ENCOUNTER — Other Ambulatory Visit: Payer: Self-pay | Admitting: Sports Medicine

## 2015-07-26 ENCOUNTER — Other Ambulatory Visit: Payer: Self-pay | Admitting: *Deleted

## 2015-07-27 ENCOUNTER — Other Ambulatory Visit: Payer: Self-pay | Admitting: *Deleted

## 2015-07-27 MED ORDER — TRAMADOL HCL 50 MG PO TABS: ORAL_TABLET | ORAL | Status: DC

## 2015-07-28 ENCOUNTER — Other Ambulatory Visit: Payer: Self-pay | Admitting: *Deleted

## 2015-07-28 MED ORDER — TRAMADOL HCL 50 MG PO TABS: ORAL_TABLET | ORAL | Status: DC

## 2015-07-29 ENCOUNTER — Other Ambulatory Visit: Payer: Self-pay | Admitting: *Deleted

## 2015-07-29 MED ORDER — TRAMADOL HCL 50 MG PO TABS: ORAL_TABLET | ORAL | Status: DC

## 2015-08-26 ENCOUNTER — Other Ambulatory Visit: Payer: Self-pay | Admitting: Sports Medicine

## 2015-09-01 ENCOUNTER — Encounter: Payer: Self-pay | Admitting: Internal Medicine

## 2015-09-01 ENCOUNTER — Other Ambulatory Visit (INDEPENDENT_AMBULATORY_CARE_PROVIDER_SITE_OTHER): Payer: PRIVATE HEALTH INSURANCE

## 2015-09-01 ENCOUNTER — Ambulatory Visit (INDEPENDENT_AMBULATORY_CARE_PROVIDER_SITE_OTHER): Payer: Medicare Other | Admitting: Internal Medicine

## 2015-09-01 VITALS — BP 120/72 | HR 73 | Temp 98.3°F | Resp 16 | Wt 267.0 lb

## 2015-09-01 DIAGNOSIS — M19041 Primary osteoarthritis, right hand: Secondary | ICD-10-CM

## 2015-09-01 DIAGNOSIS — E038 Other specified hypothyroidism: Secondary | ICD-10-CM | POA: Diagnosis not present

## 2015-09-01 DIAGNOSIS — M19049 Primary osteoarthritis, unspecified hand: Secondary | ICD-10-CM | POA: Insufficient documentation

## 2015-09-01 DIAGNOSIS — M545 Low back pain, unspecified: Secondary | ICD-10-CM | POA: Insufficient documentation

## 2015-09-01 DIAGNOSIS — E1169 Type 2 diabetes mellitus with other specified complication: Secondary | ICD-10-CM | POA: Insufficient documentation

## 2015-09-01 DIAGNOSIS — M109 Gout, unspecified: Secondary | ICD-10-CM | POA: Insufficient documentation

## 2015-09-01 DIAGNOSIS — M19042 Primary osteoarthritis, left hand: Secondary | ICD-10-CM

## 2015-09-01 DIAGNOSIS — R7303 Prediabetes: Secondary | ICD-10-CM | POA: Diagnosis not present

## 2015-09-01 DIAGNOSIS — G4730 Sleep apnea, unspecified: Secondary | ICD-10-CM

## 2015-09-01 DIAGNOSIS — E119 Type 2 diabetes mellitus without complications: Secondary | ICD-10-CM | POA: Insufficient documentation

## 2015-09-01 LAB — COMPREHENSIVE METABOLIC PANEL
ALK PHOS: 73 U/L (ref 39–117)
ALT: 46 U/L (ref 0–53)
AST: 33 U/L (ref 0–37)
Albumin: 4.2 g/dL (ref 3.5–5.2)
BILIRUBIN TOTAL: 0.8 mg/dL (ref 0.2–1.2)
BUN: 21 mg/dL (ref 6–23)
CO2: 25 meq/L (ref 19–32)
CREATININE: 0.88 mg/dL (ref 0.40–1.50)
Calcium: 8.9 mg/dL (ref 8.4–10.5)
Chloride: 108 mEq/L (ref 96–112)
GFR: 92.38 mL/min (ref >60.00)
GLUCOSE: 106 mg/dL — AB (ref 70–99)
Potassium: 3.9 mEq/L (ref 3.5–5.1)
SODIUM: 140 meq/L (ref 135–145)
TOTAL PROTEIN: 7.2 g/dL (ref 6.0–8.3)

## 2015-09-01 LAB — HEMOGLOBIN A1C: Hgb A1c MFr Bld: 6.2 % (ref 4.6–6.5)

## 2015-09-01 LAB — TSH: TSH: 5.32 u[IU]/mL — AB (ref 0.35–4.50)

## 2015-09-01 NOTE — Progress Notes (Signed)
Pre visit review using our clinic review tool, if applicable. No additional management support is needed unless otherwise documented below in the visit note. 

## 2015-09-01 NOTE — Patient Instructions (Addendum)
  Test(s) ordered today. Your results will be released to [place] (or called to you) after review, usually within 72hours after test completion. If any changes need to be made, you will be notified at that same time.   Medications reviewed and updated.  No changes recommended at this time.   Please followup in 6 months or so for a wellness visit

## 2015-09-01 NOTE — Assessment & Plan Note (Signed)
Check tsh  Titrate med dose if needed Is taking 100 mcg daily, except one day taking 50 mcg  Will consider decreasing to 88 mcg daily depending on tsh

## 2015-09-01 NOTE — Assessment & Plan Note (Addendum)
Check a1c Advised him to stay as active as possible with his arthritis Low sugar/carb diet
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
## 2015-09-27 ENCOUNTER — Other Ambulatory Visit: Payer: Self-pay | Admitting: *Deleted

## 2015-09-27 MED ORDER — TRAMADOL HCL 50 MG PO TABS: ORAL_TABLET | ORAL | Status: DC

## 2015-09-27 MED ORDER — DICLOFENAC SODIUM 75 MG PO TBEC: 75.0000 mg | DELAYED_RELEASE_TABLET | Freq: Three times a day (TID) | ORAL | Status: DC

## 2015-10-15 ENCOUNTER — Telehealth: Payer: Self-pay | Admitting: Internal Medicine

## 2015-10-15 MED ORDER — LEVOTHYROXINE SODIUM 100 MCG PO TABS: ORAL_TABLET | ORAL | Status: DC

## 2015-10-15 NOTE — Telephone Encounter (Signed)
Spoke with pt. RX has been sent to Pharm.

## 2015-10-15 NOTE — Telephone Encounter (Signed)
Pt wife called in and said that pt would like a scripts to now go to the [facility] at [place]

## 2015-10-15 NOTE — Telephone Encounter (Signed)
LVM for wife to return call  

## 2015-10-21 ENCOUNTER — Encounter: Payer: Self-pay | Admitting: Adult Health

## 2015-10-21 ENCOUNTER — Ambulatory Visit (INDEPENDENT_AMBULATORY_CARE_PROVIDER_SITE_OTHER): Payer: Medicare Other | Admitting: Adult Health

## 2015-10-21 ENCOUNTER — Ambulatory Visit (INDEPENDENT_AMBULATORY_CARE_PROVIDER_SITE_OTHER)
Admission: RE | Admit: 2015-10-21 | Discharge: 2015-10-21 | Disposition: A | Discharge status: Home / Self Care | Payer: Medicare Other | Source: Ambulatory Visit | Attending: Adult Health | Admitting: Adult Health

## 2015-10-21 ENCOUNTER — Other Ambulatory Visit: Payer: Medicare Other

## 2015-10-21 ENCOUNTER — Other Ambulatory Visit: Payer: Self-pay | Admitting: Adult Health

## 2015-10-21 ENCOUNTER — Telehealth: Payer: Self-pay | Admitting: Adult Health

## 2015-10-21 DIAGNOSIS — J069 Acute upper respiratory infection, unspecified: Secondary | ICD-10-CM | POA: Diagnosis not present

## 2015-10-21 DIAGNOSIS — R0602 Shortness of breath: Secondary | ICD-10-CM | POA: Diagnosis not present

## 2015-10-21 DIAGNOSIS — R05 Cough: Secondary | ICD-10-CM | POA: Diagnosis not present

## 2015-10-21 MED ORDER — IPRATROPIUM-ALBUTEROL 0.5-2.5 (3) MG/3ML IN SOLN
3.0000 mL | Freq: Once | RESPIRATORY_TRACT | Status: AC
  Administered 2015-10-21: 3 mL via RESPIRATORY_TRACT

## 2015-10-21 MED ORDER — PREDNISONE 10 MG PO TABS: 10.0000 mg | ORAL_TABLET | Freq: Every day | ORAL | Status: DC

## 2015-10-21 MED ORDER — IPRATROPIUM-ALBUTEROL 0.5-2.5 (3) MG/3ML IN SOLN: 3.0000 mL | Freq: Four times a day (QID) | RESPIRATORY_TRACT | Status: DC

## 2015-10-21 MED ORDER — AZITHROMYCIN 250 MG PO TABS: ORAL_TABLET | ORAL | Status: DC

## 2015-10-21 NOTE — Patient Instructions (Addendum)
It was great meeting you today  Please go to the [facility] and get a chest x ray. I will follow up with you regarding your results.   Follow up with Dr. Quay Burow if no improvement

## 2015-10-21 NOTE — Telephone Encounter (Signed)
Spoke to Dalton Murphy and informed him of his chest x ray results. He does not have pneumonia but chest x ray showed probable degree of emphysema and pulmonary arterial hypertension.   I will treat likely bronchitis with prednisone taper and Z pack.   [name] was asked to follow up with Dr. Quay Burow for further evaluation. Note sent to Dr. Quay Burow.
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
## 2015-10-26 ENCOUNTER — Encounter: Payer: Self-pay | Admitting: Sports Medicine

## 2015-10-26 ENCOUNTER — Ambulatory Visit (INDEPENDENT_AMBULATORY_CARE_PROVIDER_SITE_OTHER): Payer: Medicare Other | Admitting: Sports Medicine

## 2015-10-26 VITALS — BP 112/72 | HR 59 | Ht 71.0 in | Wt 240.0 lb

## 2015-10-26 DIAGNOSIS — L603 Nail dystrophy: Secondary | ICD-10-CM | POA: Insufficient documentation

## 2015-10-26 DIAGNOSIS — M10079 Idiopathic gout, unspecified ankle and foot: Secondary | ICD-10-CM

## 2015-10-26 DIAGNOSIS — M129 Arthropathy, unspecified: Secondary | ICD-10-CM | POA: Diagnosis not present

## 2015-10-26 DIAGNOSIS — M109 Gout, unspecified: Secondary | ICD-10-CM

## 2015-10-26 DIAGNOSIS — M1711 Unilateral primary osteoarthritis, right knee: Secondary | ICD-10-CM

## 2015-10-26 NOTE — Assessment & Plan Note (Signed)
Cont on tramadol and diclofenac for pain  Consider ortho consult for TKR

## 2015-10-26 NOTE — Patient Instructions (Signed)
[facility] [place], [place] [address] ([place]  Phone: [phone]

## 2015-10-26 NOTE — Assessment & Plan Note (Signed)
Chronic suppresive therapy with high dose diclofenac seems to be working No acute flares this year  Will continue this but needs to follow his metabolic status with his PCP

## 2015-10-26 NOTE — Progress Notes (Signed)
Patient ID: Dalton Murphy, male   DOB: [birth year], 65 y.o.   MRN: [id number]  F/u of gout and foot pain  With qid tramadol and 75 diclofenac tid has controlled pain to mild level Able to increase work to 40 hrs per week Walks on hard floors  Sports insoles with RT side lift really help  Some days some mild swelling in great toes but much less than last year  PAST HX OSA G4 OA RT knee HX of ingrown nails Reviewed chronic medical issues as noted in chart  ROS RT knee pain gets severe by end of day Stops him from doing ADLs after work until he props up leg  Nails are painful and tend to ingrow He tries to cut them out himself  PEXAM Older male in NAD BP 112/72 mmHg  Pulse 59  Ht 5\' 11"  (1.803 m)  Wt 240 lb (108.863 kg)  BMI 33.49 kg/m2  Obvious varus change and chronic deformities of RT knee with spurring  RT great toe has some limitation of motion but this is improved Mild spurring at MTP No redness or warmth  LT great toe More significant spurring Minimal extension Mild redness but no warmth  Toe nail dystrophic changes on almost all nails

## 2015-10-26 NOTE — Assessment & Plan Note (Signed)
Refer for podiatry evaluation

## 2015-11-04 ENCOUNTER — Other Ambulatory Visit: Payer: Self-pay | Admitting: Sports Medicine

## 2015-11-08 ENCOUNTER — Ambulatory Visit (INDEPENDENT_AMBULATORY_CARE_PROVIDER_SITE_OTHER): Payer: Medicare Other | Admitting: Internal Medicine

## 2015-11-08 ENCOUNTER — Other Ambulatory Visit: Payer: Self-pay | Admitting: *Deleted

## 2015-11-08 ENCOUNTER — Encounter: Payer: Self-pay | Admitting: Internal Medicine

## 2015-11-08 VITALS — BP 112/82 | HR 83 | Temp 97.6°F | Resp 16 | Ht 71.0 in | Wt 233.0 lb

## 2015-11-08 DIAGNOSIS — R9389 Abnormal findings on diagnostic imaging of other specified body structures: Secondary | ICD-10-CM

## 2015-11-08 DIAGNOSIS — G4730 Sleep apnea, unspecified: Secondary | ICD-10-CM

## 2015-11-08 DIAGNOSIS — R0609 Other forms of dyspnea: Secondary | ICD-10-CM

## 2015-11-08 DIAGNOSIS — R938 Abnormal findings on diagnostic imaging of other specified body structures: Secondary | ICD-10-CM | POA: Diagnosis not present

## 2015-11-08 DIAGNOSIS — R7303 Prediabetes: Secondary | ICD-10-CM

## 2015-11-08 MED ORDER — TRAMADOL HCL 50 MG PO TABS: ORAL_TABLET | ORAL | 0 refills | Status: DC

## 2015-11-08 NOTE — Assessment & Plan Note (Addendum)
Likely multifactorial - related to deconditioning, obesity, mild COPD, ? Heart disease This predates his recent infection - discussed potential causes and possible tests to help evaluate causes for treatment options He does not want to have further testing at this time, but will think about it Will monitor DOE Continue weight loss efforts

## 2015-11-08 NOTE — Assessment & Plan Note (Signed)
Has made diet changes and has lost weight Continue efforts Will recheck a1c at his next visit in a few months
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
## 2015-11-08 NOTE — Progress Notes (Signed)
Pre visit review using our clinic review tool, if applicable. No additional management support is needed unless otherwise documented below in the visit note. 

## 2015-11-08 NOTE — Patient Instructions (Addendum)
   Medications reviewed and updated.  No changes recommended at this time.  We can consider an inhaler if needed for your shortness of breath.    Consider further testing for your shortness of breath.   Please followup in 3 months

## 2015-11-08 NOTE — Assessment & Plan Note (Signed)
Reviewed results Possible emphysema - discussed that the cxr is not how we diagnose this and he would need PFTs - he declined at this time and declined an inhaler Possible PAH - using cpap nightly.  Discussed [facility] for [facility] evaluation and evaluation of DOE, but he declined at this time - will discuss again at his next visit depending on his DOE

## 2015-11-08 NOTE — Assessment & Plan Note (Signed)
Using CPAP nightly 

## 2015-12-01 ENCOUNTER — Ambulatory Visit: Payer: Medicare Other | Admitting: Internal Medicine

## 2015-12-07 ENCOUNTER — Other Ambulatory Visit: Payer: Self-pay | Admitting: *Deleted

## 2015-12-07 MED ORDER — TRAMADOL HCL 50 MG PO TABS: ORAL_TABLET | ORAL | 3 refills | Status: DC

## 2016-01-05 ENCOUNTER — Other Ambulatory Visit: Payer: Self-pay | Admitting: Internal Medicine

## 2016-02-09 ENCOUNTER — Other Ambulatory Visit (INDEPENDENT_AMBULATORY_CARE_PROVIDER_SITE_OTHER): Payer: Medicare Other

## 2016-02-09 ENCOUNTER — Ambulatory Visit (INDEPENDENT_AMBULATORY_CARE_PROVIDER_SITE_OTHER): Payer: Medicare Other | Admitting: Internal Medicine

## 2016-02-09 ENCOUNTER — Encounter: Payer: Self-pay | Admitting: Internal Medicine

## 2016-02-09 VITALS — BP 118/62 | HR 61 | Temp 98.3°F | Resp 16 | Wt 238.0 lb

## 2016-02-09 DIAGNOSIS — R7303 Prediabetes: Secondary | ICD-10-CM | POA: Diagnosis not present

## 2016-02-09 DIAGNOSIS — E038 Other specified hypothyroidism: Secondary | ICD-10-CM | POA: Diagnosis not present

## 2016-02-09 DIAGNOSIS — Z1159 Encounter for screening for other viral diseases: Secondary | ICD-10-CM

## 2016-02-09 DIAGNOSIS — D171 Benign lipomatous neoplasm of skin and subcutaneous tissue of trunk: Secondary | ICD-10-CM

## 2016-02-09 DIAGNOSIS — G4730 Sleep apnea, unspecified: Secondary | ICD-10-CM | POA: Diagnosis not present

## 2016-02-09 DIAGNOSIS — Z23 Encounter for immunization: Secondary | ICD-10-CM | POA: Diagnosis not present

## 2016-02-09 LAB — COMPREHENSIVE METABOLIC PANEL
ALK PHOS: 77 U/L (ref 39–117)
ALT: 31 U/L (ref 0–53)
AST: 25 U/L (ref 0–37)
Albumin: 4.3 g/dL (ref 3.5–5.2)
BUN: 22 mg/dL (ref 6–23)
CHLORIDE: 103 meq/L (ref 96–112)
CO2: 24 meq/L (ref 19–32)
Calcium: 9.3 mg/dL (ref 8.4–10.5)
Creatinine, Ser: 0.89 mg/dL (ref 0.40–1.50)
GFR: 91.06 mL/min (ref >60.00)
GLUCOSE: 85 mg/dL (ref 70–99)
POTASSIUM: 3.9 meq/L (ref 3.5–5.1)
SODIUM: 137 meq/L (ref 135–145)
TOTAL PROTEIN: 7.4 g/dL (ref 6.0–8.3)
Total Bilirubin: 1.2 mg/dL (ref 0.2–1.2)

## 2016-02-09 LAB — HEMOGLOBIN A1C: Hgb A1c MFr Bld: 5.5 % (ref 4.6–6.5)

## 2016-02-09 LAB — TSH: TSH: 23.22 u[IU]/mL — ABNORMAL HIGH (ref 0.35–4.50)

## 2016-02-09 NOTE — Assessment & Plan Note (Signed)
Check tsh  Titrate med dose if needed  

## 2016-02-09 NOTE — Assessment & Plan Note (Signed)
Using cpap nightly 

## 2016-02-09 NOTE — Patient Instructions (Addendum)
   All other Health Maintenance issues reviewed.   All recommended immunizations and age-appropriate screenings are up-to-date or discussed.  prevnar immunization administered today.   Medications reviewed and updated.  No changes recommended at this time.    Please followup in 6 months

## 2016-02-09 NOTE — Assessment & Plan Note (Signed)
Reassured  - can have it removed if she wishes
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
## 2016-02-09 NOTE — Progress Notes (Signed)
Pre visit review using our clinic review tool, if applicable. No additional management support is needed unless otherwise documented below in the visit note. 

## 2016-02-09 NOTE — Assessment & Plan Note (Signed)
Check a1c continue low sugar/carb diet Stay active

## 2016-02-16 ENCOUNTER — Other Ambulatory Visit: Payer: Self-pay | Admitting: Sports Medicine

## 2016-02-16 ENCOUNTER — Other Ambulatory Visit: Payer: Self-pay | Admitting: Internal Medicine

## 2016-02-16 DIAGNOSIS — E038 Other specified hypothyroidism: Secondary | ICD-10-CM

## 2016-02-16 MED ORDER — LEVOTHYROXINE SODIUM 125 MCG PO TABS: 125.0000 ug | ORAL_TABLET | Freq: Every day | ORAL | 3 refills | Status: DC

## 2016-04-18 ENCOUNTER — Other Ambulatory Visit: Payer: Self-pay | Admitting: Sports Medicine

## 2016-04-27 ENCOUNTER — Other Ambulatory Visit (INDEPENDENT_AMBULATORY_CARE_PROVIDER_SITE_OTHER): Payer: Medicare Other

## 2016-04-27 DIAGNOSIS — E038 Other specified hypothyroidism: Secondary | ICD-10-CM

## 2016-04-27 LAB — TSH: TSH: 8.44 u[IU]/mL — ABNORMAL HIGH (ref 0.35–4.50)

## 2016-04-28 ENCOUNTER — Other Ambulatory Visit: Payer: Self-pay | Admitting: Emergency Medicine

## 2016-04-28 DIAGNOSIS — E039 Hypothyroidism, unspecified: Secondary | ICD-10-CM

## 2016-04-28 DIAGNOSIS — Z1159 Encounter for screening for other viral diseases: Secondary | ICD-10-CM

## 2016-04-28 MED ORDER — LEVOTHYROXINE SODIUM 137 MCG PO TABS: 137.0000 ug | ORAL_TABLET | Freq: Every day | ORAL | 0 refills | Status: DC

## 2016-05-10 ENCOUNTER — Ambulatory Visit (INDEPENDENT_AMBULATORY_CARE_PROVIDER_SITE_OTHER): Payer: Medicare Other | Admitting: Family Medicine

## 2016-05-10 VITALS — BP 100/62 | HR 84 | Temp 97.8°F | Ht 71.0 in | Wt 235.0 lb

## 2016-05-10 DIAGNOSIS — R062 Wheezing: Secondary | ICD-10-CM | POA: Diagnosis not present

## 2016-05-10 DIAGNOSIS — R05 Cough: Secondary | ICD-10-CM

## 2016-05-10 DIAGNOSIS — R059 Cough, unspecified: Secondary | ICD-10-CM

## 2016-05-10 MED ORDER — DOXYCYCLINE HYCLATE 100 MG PO CAPS
100.0000 mg | ORAL_CAPSULE | Freq: Two times a day (BID) | ORAL | 0 refills | Status: DC
Start: 2016-05-10 — End: 2016-08-01

## 2016-05-10 MED ORDER — PREDNISONE 10 MG PO TABS: ORAL_TABLET | ORAL | 0 refills | Status: DC
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
## 2016-05-10 NOTE — Patient Instructions (Signed)
Follow up for any fever or increasing shortness of breath. 

## 2016-05-10 NOTE — Progress Notes (Signed)
Pre visit review using our clinic review tool, if applicable. No additional management support is needed unless otherwise documented below in the visit note. 

## 2016-05-11 DIAGNOSIS — M1711 Unilateral primary osteoarthritis, right knee: Secondary | ICD-10-CM | POA: Diagnosis not present

## 2016-05-11 DIAGNOSIS — M170 Bilateral primary osteoarthritis of knee: Secondary | ICD-10-CM | POA: Diagnosis not present

## 2016-05-11 DIAGNOSIS — M1712 Unilateral primary osteoarthritis, left knee: Secondary | ICD-10-CM | POA: Diagnosis not present

## 2016-05-18 ENCOUNTER — Other Ambulatory Visit: Payer: Self-pay | Admitting: Sports Medicine

## 2016-07-24 ENCOUNTER — Other Ambulatory Visit: Payer: Self-pay | Admitting: *Deleted

## 2016-07-24 MED ORDER — TRAMADOL HCL 50 MG PO TABS: 50.0000 mg | ORAL_TABLET | Freq: Four times a day (QID) | ORAL | 2 refills | Status: DC | PRN

## 2016-07-26 ENCOUNTER — Other Ambulatory Visit (INDEPENDENT_AMBULATORY_CARE_PROVIDER_SITE_OTHER): Payer: Medicare Other

## 2016-07-26 DIAGNOSIS — Z1159 Encounter for screening for other viral diseases: Secondary | ICD-10-CM | POA: Diagnosis not present

## 2016-07-26 DIAGNOSIS — E039 Hypothyroidism, unspecified: Secondary | ICD-10-CM | POA: Diagnosis not present

## 2016-07-26 LAB — TSH: TSH: 10.33 u[IU]/mL — AB (ref 0.35–4.50)

## 2016-07-27 LAB — HEPATITIS C ANTIBODY: HCV Ab: NEGATIVE

## 2016-07-28 ENCOUNTER — Telehealth: Payer: Self-pay | Admitting: Emergency Medicine

## 2016-07-28 DIAGNOSIS — E038 Other specified hypothyroidism: Secondary | ICD-10-CM

## 2016-07-28 MED ORDER — LEVOTHYROXINE SODIUM 150 MCG PO TABS: 150.0000 ug | ORAL_TABLET | Freq: Every day | ORAL | 3 refills | Status: DC

## 2016-07-28 NOTE — Telephone Encounter (Signed)
Pt called to inquire about labs. Please advise, pt is out of medication. I informed him that the medication was probably going to be changed and that we needed to wait on the refill until I heard from Dr Quay Burow. Please advise. Does pt need name brand?

## 2016-07-28 NOTE — Telephone Encounter (Signed)
Thank you for checking. No need to call pt, I have changed the appt note.

## 2016-07-28 NOTE — Telephone Encounter (Signed)
Hepatitis C test is negative.    Thyroid function is underactive. - inc dose to 150 mcg daily. Recheck tsh in 6-8 weeks  Both ordered

## 2016-07-28 NOTE — Telephone Encounter (Signed)
SPoke with pt to inform. Pt is coming in Tuesady 4/17 for an appt. Sharee Pimple it states that he will be seeing Kim at [address]. She is not at our office, do you have him on your schedule?

## 2016-07-28 NOTE — Telephone Encounter (Signed)
I do not have him on my schedule. However, his Medicare did not begin until  08/16/2015, which means I could not see him until 08/16/16. Do you want me to call him to inform?

## 2016-07-31 NOTE — Progress Notes (Signed)
   Subjective:  Patient was not seen for an AWV.

## 2016-07-31 NOTE — Progress Notes (Signed)
Pre visit review using our clinic review tool, if applicable. No additional management support is needed unless otherwise documented below in the visit note. 

## 2016-08-01 ENCOUNTER — Ambulatory Visit (INDEPENDENT_AMBULATORY_CARE_PROVIDER_SITE_OTHER): Payer: Medicare Other | Admitting: Internal Medicine

## 2016-08-01 ENCOUNTER — Encounter: Payer: Self-pay | Admitting: Internal Medicine

## 2016-08-01 VITALS — BP 120/60 | HR 67 | Temp 98.1°F | Ht 71.0 in | Wt 240.0 lb

## 2016-08-01 DIAGNOSIS — E038 Other specified hypothyroidism: Secondary | ICD-10-CM

## 2016-08-01 DIAGNOSIS — Z01818 Encounter for other preprocedural examination: Secondary | ICD-10-CM

## 2016-08-01 DIAGNOSIS — G4730 Sleep apnea, unspecified: Secondary | ICD-10-CM

## 2016-08-01 DIAGNOSIS — R7303 Prediabetes: Secondary | ICD-10-CM | POA: Diagnosis not present

## 2016-08-01 NOTE — Progress Notes (Signed)
Pre visit review using our clinic review tool, if applicable. No additional management support is needed unless otherwise documented below in the visit note. 
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
## 2016-08-01 NOTE — Patient Instructions (Addendum)
Have blood work done in about 6 weeks   Test(s) ordered today. Your results will be released to [place] (or called to you) after review, usually within 72hours after test completion. If any changes need to be made, you will be notified at that same time.    Medications reviewed and updated.  No changes recommended at this time.     Please followup in 6 months

## 2016-08-01 NOTE — Assessment & Plan Note (Signed)
Using cpap nightly 

## 2016-08-01 NOTE — Assessment & Plan Note (Signed)
Check a1c in 6 weeks with a1c Advised weight loss Low sugar/carb diet

## 2016-08-01 NOTE — Assessment & Plan Note (Signed)
He is taking his medication daily Dose recently adjusted Recheck tsh 6-8 weeks after change

## 2016-08-16 DIAGNOSIS — M1711 Unilateral primary osteoarthritis, right knee: Secondary | ICD-10-CM | POA: Diagnosis not present

## 2016-08-28 ENCOUNTER — Other Ambulatory Visit: Payer: Self-pay | Admitting: Sports Medicine

## 2016-09-01 ENCOUNTER — Ambulatory Visit: Payer: Self-pay | Admitting: Orthopedic Surgery

## 2016-09-08 ENCOUNTER — Other Ambulatory Visit (HOSPITAL_COMMUNITY): Payer: Self-pay | Admitting: Emergency Medicine

## 2016-09-08 NOTE — Progress Notes (Signed)
LOV/clearance BURNS MD 08-01-16 epic CXR 10-21-15 epic

## 2016-09-08 NOTE — Patient Instructions (Addendum)
Dalton Murphy  09/08/2016   Your procedure is scheduled on: 09-18-16  Report to [facility]  Entrance   Report to admitting at 8AM  Bring CPAP mask and tubing only. Device will be provided.   Call this number if you have problems the morning of surgery  [phone]   Remember: ONLY 1 PERSON MAY GO WITH YOU TO SHORT STAY TO GET  READY MORNING OF YOUR SURGERY.  Do not eat food or drink liquids :After Midnight.     Take these medicines the morning of surgery with A SIP OF WATER: levothyroxine(synthroid), tramadol as needed                                You may not have any metal on your body including hair pins and              piercings  Do not wear jewelry, make-up, lotions, powders or perfumes, deodorant               Men may shave face and neck.   Do not bring valuables to the hospital. Dalton Murphy.  Contacts, dentures or bridgework may not be worn into surgery.  Leave suitcase in the car. After surgery it may be brought to your room.                Please read over the following fact sheets you were given: _____________________________________________________________________             Dalton Murphy - Preparing for Surgery Before surgery, you can play an important role.  Because skin is not sterile, your skin needs to be as free of germs as possible.  You can reduce the number of germs on your skin by washing with CHG (chlorahexidine gluconate) soap before surgery.  CHG is an antiseptic cleaner which kills germs and bonds with the skin to continue killing germs even after washing. Please DO NOT use if you have an allergy to CHG or antibacterial soaps.  If your skin becomes reddened/irritated stop using the CHG and inform your nurse when you arrive at Short Stay. Do not shave (including legs and underarms) for at least 48 hours prior to the first CHG shower.  You may shave your face/neck. Please  follow these instructions carefully:  1.  Shower with CHG Soap the night before surgery and the  morning of Surgery.  2.  If you choose to wash your hair, wash your hair first as usual with your  normal  shampoo.  3.  After you shampoo, rinse your hair and body thoroughly to remove the  shampoo.                           4.  Use CHG as you would any other liquid soap.  You can apply chg directly  to the skin and wash                       Gently with a scrungie or clean washcloth.  5.  Apply the CHG Soap to your body ONLY FROM THE NECK DOWN.   Do not use on face/ open  Wound or open sores. Avoid contact with eyes, ears mouth and genitals (private parts).                       Wash face,  Genitals (private parts) with your normal soap.             6.  Wash thoroughly, paying special attention to the area where your surgery  will be performed.  7.  Thoroughly rinse your body with warm water from the neck down.  8.  DO NOT shower/wash with your normal soap after using and rinsing off  the CHG Soap.                9.  Pat yourself dry with a clean towel.            10.  Wear clean pajamas.            11.  Place clean sheets on your bed the night of your first shower and do not  sleep with pets. Day of Surgery : Do not apply any lotions/deodorants the morning of surgery.  Please wear clean clothes to the hospital/surgery center.  FAILURE TO FOLLOW THESE INSTRUCTIONS MAY RESULT IN THE CANCELLATION OF YOUR SURGERY PATIENT SIGNATURE_________________________________  NURSE SIGNATURE__________________________________  ________________________________________________________________________   Dalton Murphy  An incentive spirometer is a tool that can help keep your lungs clear and active. This tool measures how well you are filling your lungs with each breath. Taking long deep breaths may help reverse or decrease the chance of developing breathing (pulmonary) problems  (especially infection) following:  A long period of time when you are unable to move or be active. BEFORE THE PROCEDURE   If the spirometer includes an indicator to show your best effort, your nurse or respiratory therapist will set it to a desired goal.  If possible, sit up straight or lean slightly forward. Try not to slouch.  Hold the incentive spirometer in an upright position. INSTRUCTIONS FOR USE  1. Sit on the edge of your bed if possible, or sit up as far as you can in bed or on a chair. 2. Hold the incentive spirometer in an upright position. 3. Breathe out normally. 4. Place the mouthpiece in your mouth and seal your lips tightly around it. 5. Breathe in slowly and as deeply as possible, raising the piston or the ball toward the top of the column. 6. Hold your breath for 3-5 seconds or for as long as possible. Allow the piston or ball to fall to the bottom of the column. 7. Remove the mouthpiece from your mouth and breathe out normally. 8. Rest for a few seconds and repeat Steps 1 through 7 at least 10 times every 1-2 hours when you are awake. Take your time and take a few normal breaths between deep breaths. 9. The spirometer may include an indicator to show your best effort. Use the indicator as a goal to work toward during each repetition. 10. After each set of 10 deep breaths, practice coughing to be sure your lungs are clear. If you have an incision (the cut made at the time of surgery), support your incision when coughing by placing a pillow or rolled up towels firmly against it. Once you are able to get out of bed, walk around indoors and cough well. You may stop using the incentive spirometer when instructed by your caregiver.  RISKS AND COMPLICATIONS  Take your time so you do not get  dizzy or light-headed.  If you are in pain, you may need to take or ask for pain medication before doing incentive spirometry. It is harder to take a deep breath if you are having  pain. AFTER USE  Rest and breathe slowly and easily.  It can be helpful to keep track of a log of your progress. Your caregiver can provide you with a simple table to help with this. If you are using the spirometer at home, follow these instructions: Dalton Murphy IF:   You are having difficultly using the spirometer.  You have trouble using the spirometer as often as instructed.  Your pain medication is not giving enough relief while using the spirometer.  You develop fever of 100.5 F (38.1 C) or higher. SEEK IMMEDIATE MEDICAL CARE IF:   You cough up bloody sputum that had not been present before.  You develop fever of 102 F (38.9 C) or greater.  You develop worsening pain at or near the incision site. MAKE SURE YOU:   Understand these instructions.  Will watch your condition.  Will get help right away if you are not doing well or get worse. Document Released: 08/14/2006 Document Revised: 06/26/2011 Document Reviewed: 10/15/2006 [employer] Patient Information 2014 [employer], [place].   ________________________________________________________________________  WHAT IS A BLOOD TRANSFUSION? Blood Transfusion Information  A transfusion is the replacement of blood or some of its parts. Blood is made up of multiple cells which provide different functions.  Red blood cells carry oxygen and are used for blood loss replacement.  White blood cells fight against infection.  Platelets control bleeding.  Plasma helps clot blood.  Other blood products are available for specialized needs, such as hemophilia or other clotting disorders. BEFORE THE TRANSFUSION  Who gives blood for transfusions?   Healthy volunteers who are fully evaluated to make sure their blood is safe. This is blood bank blood. Transfusion therapy is the safest it has ever been in the practice of medicine. Before blood is taken from a donor, a complete history is taken to make sure that person has no history  of diseases nor engages in risky social behavior (examples are intravenous drug use or sexual activity with multiple partners). The donor's travel history is screened to minimize risk of transmitting infections, such as malaria. The donated blood is tested for signs of infectious diseases, such as HIV and hepatitis. The blood is then tested to be sure it is compatible with you in order to minimize the chance of a transfusion reaction. If you or a relative donates blood, this is often done in anticipation of surgery and is not appropriate for emergency situations. It takes many days to process the donated blood. RISKS AND COMPLICATIONS Although transfusion therapy is very safe and saves many lives, the main dangers of transfusion include:   Getting an infectious disease.  Developing a transfusion reaction. This is an allergic reaction to something in the blood you were given. Every precaution is taken to prevent this. The decision to have a blood transfusion has been considered carefully by your caregiver before blood is given. Blood is not given unless the benefits outweigh the risks. AFTER THE TRANSFUSION  Right after receiving a blood transfusion, you will usually feel much better and more energetic. This is especially true if your red blood cells have gotten low (anemic). The transfusion raises the level of the red blood cells which carry oxygen, and this usually causes an energy increase.  The nurse administering the transfusion will  monitor you carefully for complications. HOME CARE INSTRUCTIONS  No special instructions are needed after a transfusion. You may find your energy is better. Speak with your caregiver about any limitations on activity for underlying diseases you may have. SEEK MEDICAL CARE IF:   Your condition is not improving after your transfusion.  You develop redness or irritation at the intravenous (IV) site. SEEK IMMEDIATE MEDICAL CARE IF:  Any of the following symptoms  occur over the next 12 hours:  Shaking chills.  You have a temperature by mouth above 102 F (38.9 C), not controlled by medicine.  Chest, back, or muscle pain.  People around you feel you are not acting correctly or are confused.  Shortness of breath or difficulty breathing.  Dizziness and fainting.  You get a rash or develop hives.  You have a decrease in urine output.  Your urine turns a dark color or changes to pink, red, or brown. Any of the following symptoms occur over the next 10 days:  You have a temperature by mouth above 102 F (38.9 C), not controlled by medicine.  Shortness of breath.  Weakness after normal activity.  The white part of the eye turns yellow (jaundice).  You have a decrease in the amount of urine or are urinating less often.  Your urine turns a dark color or changes to pink, red, or brown. Document Released: 03/31/2000 Document Revised: 06/26/2011 Document Reviewed: 11/18/2007 [facility] Patient Information 2014 [place], [place].  _______________________________________________________________________

## 2016-09-11 ENCOUNTER — Ambulatory Visit: Payer: Self-pay | Admitting: Orthopedic Surgery

## 2016-09-11 NOTE — H&P (Signed)
Dalton Murphy DOB: Dec 01, 1950 Married / Language: English / Race: White Male Date of Admission:  09/18/2016 CC:  Right Knee Pain History of Present Illness  The patient is a 66 year old male who comes in for a preoperative History and Physical. The patient is scheduled for a right total knee arthroplasty to be performed by Dr. Dione Plover. Aluisio, MD at [facility] on 09/18/2016. The patient is a 66 year old male who presented to the practice and reported left knee and right knee symptoms including pain which began 10 year(s) ago without any known injury. Patient states that both knees hurt, but the right is worse. He said that both knees hurt all throughout, no localized area. He takes Tramadol for the knee pain, but was originally prescribed for his gout. His pain is better with rest, but hurts with movement or weightbearing. He said that he can hear a crunching sound when walking. He has had problems with his knees for very long time now. He has gotten progressively worse over the past year. Both knees hurt but the right is more symptomatic than the left. It hurts at all times. It is limiting what he can and cannot do. He would like to be more active but cannot do so because of the knee pain. He said that he even worsen the pain or his problems with function. He can tolerate the pain with the function but it is really giving him the most trouble. He does not have any associated hip pain, lower extremity weakness or paresthesia. The knee occasionally will give out on him also. Radiographs in the office, AP and lateral both knees show that right knee has bone on bone arthritis, medial and patellofemoral. Left knee has arthritic changes also but not as advanced as the right. He has advanced arthritis both knees, right worse than left with symptoms worse on the right. It is not just pain, he is having a lot of functional issues also. Injections will not help with any of the functional issues. He is at a  stage where he wants to get more active and be able to do things. He would like a more permanent solution to this. The most predictive means of improving his pain and function is going to be total knee arthroplasty. We did discuss that in detail. He has such significant functional problems that I really do not think injections are indicated. We will proceed surgery on the right knee at this time. They have been treated conservatively in the past for the above stated problem and despite conservative measures, they continue to have progressive pain and severe functional limitations and dysfunction. They have failed non-operative management including home exercise, medications. It is felt that they would benefit from undergoing total joint replacement. Risks and benefits of the procedure have been discussed with the patient and they elect to proceed with surgery. There are no active contraindications to surgery such as ongoing infection or rapidly progressive neurological disease.    Problem List/Past Medical  Primary osteoarthritis of right knee (M17.11)  Lumbago (M54.5) [09/10/2002]: Gout  Hypothyroidism  Sleep Apnea  uses CPAP Degenerative Disc Disease  Lumbar  Allergies  No Known Allergies   Family History Chronic Obstructive Lung Disease  brother Diabetes Mellitus  brother Hypertension  mother Osteoarthritis  mother  Social History Alcohol use  current drinker; drinks beer; only occasionally per week Children  2 Current work status  working full time Drug/Alcohol Rehab (Currently)  no Drug/Alcohol Rehab (  Previously)  no Exercise  Exercises daily; does running / walking Illicit drug use  no Living situation  live with spouse Marital status  married Number of flights of stairs before winded  4-5 Pain Contract  no Tobacco / smoke exposure  no Tobacco use  former smoker; smoke(d) less than 1/2 pack(s) per day Advance Directives  Living Will, Healthcare  POA  Medication History TraMADol HCl (50MG  Tablet, Oral) Active. Diclofenac Sodium (75MG  Tablet DR, Oral) Active. Levothyroxine Sodium (137MCG Tablet, Oral) Active.  Past Surgical History Colon Polyp Removal - Colonoscopy  Vasectomy    Review of Systems  General Not Present- Chills, Fatigue, Fever, Memory Loss, Night Sweats, Weight Gain and Weight Loss. Skin Not Present- Eczema, Hives, Itching, Lesions and Rash. HEENT Not Present- Dentures, Double Vision, Headache, Hearing Loss, Tinnitus and Visual Loss. Respiratory Not Present- Allergies, Chronic Cough, Coughing up blood, Shortness of breath at rest and Shortness of breath with exertion. Cardiovascular Not Present- Chest Pain, Difficulty Breathing Lying Down, Murmur, Palpitations, Racing/skipping heartbeats and Swelling. Gastrointestinal Not Present- Abdominal Pain, Bloody Stool, Constipation, Diarrhea, Difficulty Swallowing, Heartburn, Jaundice, Loss of appetitie, Nausea and Vomiting. Male Genitourinary Present- Urinary frequency. Not Present- Blood in Urine, Discharge, Flank Pain, Incontinence, Painful Urination, Urgency, Urinary Retention, Urinating at Night and Weak urinary stream. Musculoskeletal Present- Joint Pain, Joint Swelling and Morning Stiffness. Not Present- Back Pain, Muscle Pain, Muscle Weakness and Spasms. Neurological Not Present- Blackout spells, Difficulty with balance, Dizziness, Paralysis, Tremor and Weakness. Psychiatric Not Present- Insomnia.  Vitals  Weight: 240 lb Height: 71in Weight was reported by patient. Height was reported by patient. Body Surface Area: 2.28 m Body Mass Index: 33.47 kg/m  Pulse: 64 (Regular)  BP: 114/64 (Sitting, Right Arm, Standard)  Physical Exam General Mental Status -Alert, cooperative and good historian. General Appearance-pleasant, Not in acute distress. Orientation-Oriented X3. Build & Nutrition-Well nourished and Well developed.  Head and  Neck Head-normocephalic, atraumatic . Neck Global Assessment - supple, no bruit auscultated on the right, no bruit auscultated on the left.  Eye Vision-Wears corrective lenses(readers only). Pupil - Bilateral-Regular and Round. Motion - Bilateral-EOMI.  Chest and Lung Exam Auscultation Breath sounds - clear at anterior chest wall and clear at posterior chest wall. Adventitious sounds - No Adventitious sounds.  Cardiovascular Auscultation Rhythm - Regular rate and rhythm. Heart Sounds - S1 WNL and S2 WNL. Murmurs & Other Heart Sounds - Auscultation of the heart reveals - No Murmurs.  Abdomen Palpation/Percussion Tenderness - Abdomen is non-tender to palpation. Rigidity (guarding) - Abdomen is soft. Auscultation Auscultation of the abdomen reveals - Bowel sounds normal.  Male Genitourinary Note: Not done, not pertinent to present illness   Musculoskeletal Note: Well developed male, in no distress. Both hips have normal motion, no discomfort. His left knee shows no effusion. Range of motion on the left is about 0 to 130. There is some tenderness medially. There is no lateral tenderness or any instability. Right knee, no effusion, range 5 to 125. There is moderate crepitus on range of motion. He is very tender medially. There is no lateral tenderness. There is no instability noted.  RADIOGRAPHS AP and lateral both knees show that right knee has bone on bone arthritis, medial and patellofemoral. Left knee has arthritic changes also but not as advanced as the right.  Assessment & Plan  Primary osteoarthritis of right knee (M17.11)  Note:Surgical Plans: Right Total Knee Replacement  Disposition: Home, straight to outpatient therapy at [facility]  PCP: Dr. Quay Burow - Patient has  been seen preoperatively and felt to be stable for surgery.  IV TXA  Patient was instructed on what medications to stop prior to surgery.  Signed electronically by Joelene Millin, III  PA-C

## 2016-09-12 ENCOUNTER — Encounter (HOSPITAL_COMMUNITY): Payer: Self-pay

## 2016-09-12 ENCOUNTER — Encounter (HOSPITAL_COMMUNITY)
Admission: RE | Admit: 2016-09-12 | Discharge: 2016-09-12 | Disposition: A | Discharge status: Home / Self Care | Payer: Medicare Other | Source: Ambulatory Visit | Attending: Orthopedic Surgery | Admitting: Orthopedic Surgery

## 2016-09-12 DIAGNOSIS — Z01812 Encounter for preprocedural laboratory examination: Secondary | ICD-10-CM | POA: Insufficient documentation

## 2016-09-12 DIAGNOSIS — M1711 Unilateral primary osteoarthritis, right knee: Secondary | ICD-10-CM | POA: Diagnosis not present

## 2016-09-12 LAB — COMPREHENSIVE METABOLIC PANEL
ALK PHOS: 77 U/L (ref 38–126)
ALT: 34 U/L (ref 17–63)
ANION GAP: 9 (ref 5–15)
AST: 29 U/L (ref 15–41)
Albumin: 3.9 g/dL (ref 3.5–5.0)
BUN: 20 mg/dL (ref 6–20)
CALCIUM: 9 mg/dL (ref 8.9–10.3)
CO2: 22 mmol/L (ref 22–32)
CREATININE: 0.92 mg/dL (ref 0.61–1.24)
Chloride: 106 mmol/L (ref 101–111)
Glucose, Bld: 95 mg/dL (ref 65–99)
Potassium: 4.2 mmol/L (ref 3.5–5.1)
Sodium: 137 mmol/L (ref 135–145)
Total Bilirubin: 0.9 mg/dL (ref 0.3–1.2)
Total Protein: 7.4 g/dL (ref 6.5–8.1)

## 2016-09-12 LAB — CBC
HCT: 41.8 % (ref 39.0–52.0)
Hemoglobin: 14.4 g/dL (ref 13.0–17.0)
MCH: 30.8 pg (ref 26.0–34.0)
MCHC: 34.4 g/dL (ref 30.0–36.0)
MCV: 89.3 fL (ref 78.0–100.0)
PLATELETS: 210 10*3/uL (ref 150–400)
RBC: 4.68 MIL/uL (ref 4.22–5.81)
RDW: 13.6 % (ref 11.5–15.5)
WBC: 8 10*3/uL (ref 4.0–10.5)

## 2016-09-12 LAB — ABO/RH: ABO/RH(D): O POS

## 2016-09-12 LAB — SURGICAL PCR SCREEN
MRSA, PCR: NEGATIVE
Staphylococcus aureus: POSITIVE — AB

## 2016-09-12 LAB — PROTIME-INR
INR: 1
Prothrombin Time: 13.2 seconds (ref 11.4–15.2)

## 2016-09-12 LAB — APTT: aPTT: 33 seconds (ref 24–36)

## 2016-09-13 LAB — HEMOGLOBIN A1C
HEMOGLOBIN A1C: 5.4 % (ref 4.8–5.6)
MEAN PLASMA GLUCOSE: 108 mg/dL

## 2016-09-17 NOTE — Anesthesia Preprocedure Evaluation (Addendum)
Anesthesia Evaluation  Patient identified by MRN, date of birth, ID band Patient awake    Reviewed: Allergy & Precautions, H&P , NPO status , Patient's Chart, lab work & pertinent test results  Airway Mallampati: II  TM Distance: >3 FB Neck ROM: Full    Dental no notable dental hx. (+) Teeth Intact, Dental Advisory Given   Pulmonary sleep apnea and Continuous Positive Airway Pressure Ventilation , former smoker,    Pulmonary exam normal breath sounds clear to auscultation       Cardiovascular Exercise Tolerance: Good negative cardio ROS   Rhythm:Regular Rate:Normal     Neuro/Psych negative neurological ROS  negative psych ROS   GI/Hepatic negative GI ROS, Neg liver ROS,   Endo/Other  Hypothyroidism Morbid obesity  Renal/GU negative Renal ROS  negative genitourinary   Musculoskeletal  (+) Arthritis , Osteoarthritis,    Abdominal   Peds  Hematology negative hematology ROS (+)   Anesthesia Other Findings   Reproductive/Obstetrics negative OB ROS                            Anesthesia Physical Anesthesia Plan  ASA: III  Anesthesia Plan: Spinal   Post-op Pain Management:  Regional for Post-op pain   Induction: Intravenous  Airway Management Planned: Simple Face Mask  Additional Equipment:   Intra-op Plan:   Post-operative Plan:   Informed Consent: I have reviewed the patients History and Physical, chart, labs and discussed the procedure including the risks, benefits and alternatives for the proposed anesthesia with the patient or authorized representative who has indicated his/her understanding and acceptance.   Dental advisory given  Plan Discussed with: CRNA  Anesthesia Plan Comments:         Anesthesia Quick Evaluation

## 2016-09-18 ENCOUNTER — Inpatient Hospital Stay (HOSPITAL_COMMUNITY): Payer: Medicare Other | Admitting: Anesthesiology

## 2016-09-18 ENCOUNTER — Inpatient Hospital Stay (HOSPITAL_COMMUNITY)
Admission: RE | Admit: 2016-09-18 | Discharge: 2016-09-20 | DRG: 470 | Disposition: A | Discharge status: Home / Self Care | Payer: Medicare Other | Source: Ambulatory Visit | Attending: Orthopedic Surgery | Admitting: Orthopedic Surgery

## 2016-09-18 ENCOUNTER — Encounter (HOSPITAL_COMMUNITY): Payer: Self-pay | Admitting: Certified Registered"

## 2016-09-18 ENCOUNTER — Encounter (HOSPITAL_COMMUNITY)
Admission: RE | Disposition: A | Discharge status: Home / Self Care | Payer: Self-pay | Source: Ambulatory Visit | Attending: Orthopedic Surgery

## 2016-09-18 DIAGNOSIS — E039 Hypothyroidism, unspecified: Secondary | ICD-10-CM | POA: Diagnosis present

## 2016-09-18 DIAGNOSIS — M109 Gout, unspecified: Secondary | ICD-10-CM | POA: Diagnosis present

## 2016-09-18 DIAGNOSIS — Z833 Family history of diabetes mellitus: Secondary | ICD-10-CM | POA: Diagnosis not present

## 2016-09-18 DIAGNOSIS — G4730 Sleep apnea, unspecified: Secondary | ICD-10-CM | POA: Diagnosis present

## 2016-09-18 DIAGNOSIS — Z8249 Family history of ischemic heart disease and other diseases of the circulatory system: Secondary | ICD-10-CM | POA: Diagnosis not present

## 2016-09-18 DIAGNOSIS — M1711 Unilateral primary osteoarthritis, right knee: Principal | ICD-10-CM | POA: Diagnosis present

## 2016-09-18 DIAGNOSIS — G8918 Other acute postprocedural pain: Secondary | ICD-10-CM | POA: Diagnosis not present

## 2016-09-18 DIAGNOSIS — E785 Hyperlipidemia, unspecified: Secondary | ICD-10-CM | POA: Diagnosis not present

## 2016-09-18 DIAGNOSIS — M1712 Unilateral primary osteoarthritis, left knee: Secondary | ICD-10-CM | POA: Diagnosis present

## 2016-09-18 DIAGNOSIS — Z6837 Body mass index (BMI) 37.0-37.9, adult: Secondary | ICD-10-CM

## 2016-09-18 DIAGNOSIS — Z87891 Personal history of nicotine dependence: Secondary | ICD-10-CM

## 2016-09-18 DIAGNOSIS — M25561 Pain in right knee: Secondary | ICD-10-CM | POA: Diagnosis not present

## 2016-09-18 HISTORY — PX: TOTAL KNEE ARTHROPLASTY: SHX125

## 2016-09-18 LAB — TYPE AND SCREEN
ABO/RH(D): O POS
Antibody Screen: NEGATIVE

## 2016-09-18 SURGERY — ARTHROPLASTY, KNEE, TOTAL
Anesthesia: Spinal | Site: Knee | Laterality: Right

## 2016-09-18 MED ORDER — BUPIVACAINE IN DEXTROSE 0.75-8.25 % IT SOLN
INTRATHECAL | Status: DC | PRN
  Administered 2016-09-18: 2 mL via INTRATHECAL

## 2016-09-18 MED ORDER — ACETAMINOPHEN 10 MG/ML IV SOLN
1000.0000 mg | Freq: Once | INTRAVENOUS | Status: AC
  Administered 2016-09-18: 1000 mg via INTRAVENOUS

## 2016-09-18 MED ORDER — DEXAMETHASONE SODIUM PHOSPHATE 10 MG/ML IJ SOLN
10.0000 mg | Freq: Once | INTRAMUSCULAR | Status: AC
  Administered 2016-09-19 (×2): 10 mg via INTRAVENOUS
  Filled 2016-09-18: qty 1

## 2016-09-18 MED ORDER — METHOCARBAMOL 500 MG PO TABS
500.0000 mg | ORAL_TABLET | Freq: Four times a day (QID) | ORAL | Status: DC | PRN
  Administered 2016-09-18 – 2016-09-20 (×7): 500 mg via ORAL
  Filled 2016-09-18 (×7): qty 1

## 2016-09-18 MED ORDER — MIDAZOLAM HCL 2 MG/2ML IJ SOLN
INTRAMUSCULAR | Status: AC
  Filled 2016-09-18: qty 2

## 2016-09-18 MED ORDER — ONDANSETRON HCL 4 MG/2ML IJ SOLN
INTRAMUSCULAR | Status: DC | PRN
  Administered 2016-09-18: 4 mg via INTRAVENOUS

## 2016-09-18 MED ORDER — CEFAZOLIN SODIUM-DEXTROSE 2-4 GM/100ML-% IV SOLN
2.0000 g | Freq: Four times a day (QID) | INTRAVENOUS | Status: AC
  Administered 2016-09-18 – 2016-09-19 (×2): 2 g via INTRAVENOUS
  Filled 2016-09-18 (×2): qty 100

## 2016-09-18 MED ORDER — ROPIVACAINE HCL 5 MG/ML IJ SOLN
INTRAMUSCULAR | Status: DC | PRN
  Administered 2016-09-18: 30 mL via PERINEURAL

## 2016-09-18 MED ORDER — BUPIVACAINE LIPOSOME 1.3 % IJ SUSP
20.0000 mL | Freq: Once | INTRAMUSCULAR | Status: DC
  Filled 2016-09-18: qty 20

## 2016-09-18 MED ORDER — ONDANSETRON HCL 4 MG PO TABS: 4.0000 mg | ORAL_TABLET | Freq: Four times a day (QID) | ORAL | Status: DC | PRN

## 2016-09-18 MED ORDER — OXYCODONE HCL 5 MG PO TABS
5.0000 mg | ORAL_TABLET | ORAL | Status: DC | PRN
  Administered 2016-09-18 (×2): 5 mg via ORAL
  Administered 2016-09-18 – 2016-09-20 (×12): 10 mg via ORAL
  Filled 2016-09-18 (×4): qty 2
  Filled 2016-09-18: qty 1
  Filled 2016-09-18 (×8): qty 2
  Filled 2016-09-18: qty 1

## 2016-09-18 MED ORDER — MORPHINE SULFATE (PF) 2 MG/ML IV SOLN
1.0000 mg | INTRAVENOUS | Status: DC | PRN
  Administered 2016-09-18 – 2016-09-19 (×4): 1 mg via INTRAVENOUS
  Filled 2016-09-18 (×4): qty 1

## 2016-09-18 MED ORDER — ONDANSETRON HCL 4 MG/2ML IJ SOLN
INTRAMUSCULAR | Status: AC
  Filled 2016-09-18: qty 2

## 2016-09-18 MED ORDER — METOCLOPRAMIDE HCL 5 MG PO TABS: 5.0000 mg | ORAL_TABLET | Freq: Three times a day (TID) | ORAL | Status: DC | PRN

## 2016-09-18 MED ORDER — SODIUM CHLORIDE 0.9 % IV SOLN
INTRAVENOUS | Status: DC
  Administered 2016-09-18: 23:00:00 via INTRAVENOUS

## 2016-09-18 MED ORDER — SODIUM CHLORIDE 0.9 % IJ SOLN
INTRAMUSCULAR | Status: AC
  Filled 2016-09-18: qty 10

## 2016-09-18 MED ORDER — CEFAZOLIN SODIUM-DEXTROSE 2-4 GM/100ML-% IV SOLN
INTRAVENOUS | Status: AC
  Filled 2016-09-18: qty 100

## 2016-09-18 MED ORDER — DOCUSATE SODIUM 100 MG PO CAPS
100.0000 mg | ORAL_CAPSULE | Freq: Two times a day (BID) | ORAL | Status: DC
  Administered 2016-09-18 – 2016-09-20 (×4): 100 mg via ORAL
  Filled 2016-09-18 (×4): qty 1

## 2016-09-18 MED ORDER — SODIUM CHLORIDE 0.9 % IJ SOLN
INTRAMUSCULAR | Status: AC
  Filled 2016-09-18: qty 50

## 2016-09-18 MED ORDER — CEFAZOLIN SODIUM-DEXTROSE 2-4 GM/100ML-% IV SOLN
2.0000 g | INTRAVENOUS | Status: AC
  Administered 2016-09-18: 2 g via INTRAVENOUS

## 2016-09-18 MED ORDER — DIPHENHYDRAMINE HCL 12.5 MG/5ML PO ELIX
12.5000 mg | ORAL_SOLUTION | ORAL | Status: DC | PRN
  Administered 2016-09-19 (×2): 25 mg via ORAL
  Filled 2016-09-18 (×2): qty 10

## 2016-09-18 MED ORDER — METOCLOPRAMIDE HCL 5 MG/ML IJ SOLN: 5.0000 mg | Freq: Three times a day (TID) | INTRAMUSCULAR | Status: DC | PRN

## 2016-09-18 MED ORDER — PROPOFOL 10 MG/ML IV BOLUS
INTRAVENOUS | Status: AC
  Filled 2016-09-18: qty 20

## 2016-09-18 MED ORDER — MENTHOL 3 MG MT LOZG: 1.0000 | LOZENGE | OROMUCOSAL | Status: DC | PRN

## 2016-09-18 MED ORDER — LACTATED RINGERS IV SOLN
INTRAVENOUS | Status: DC
  Administered 2016-09-18 (×3): via INTRAVENOUS

## 2016-09-18 MED ORDER — FENTANYL CITRATE (PF) 100 MCG/2ML IJ SOLN
100.0000 ug | Freq: Once | INTRAMUSCULAR | Status: AC
  Administered 2016-09-18: 100 ug via INTRAVENOUS

## 2016-09-18 MED ORDER — PHENOL 1.4 % MT LIQD
1.0000 | OROMUCOSAL | Status: DC | PRN
  Filled 2016-09-18: qty 177

## 2016-09-18 MED ORDER — CHLORHEXIDINE GLUCONATE 4 % EX LIQD: 60.0000 mL | Freq: Once | CUTANEOUS | Status: DC

## 2016-09-18 MED ORDER — PROPOFOL 500 MG/50ML IV EMUL
INTRAVENOUS | Status: DC | PRN
  Administered 2016-09-18: 75 ug/kg/min via INTRAVENOUS

## 2016-09-18 MED ORDER — RIVAROXABAN 10 MG PO TABS
10.0000 mg | ORAL_TABLET | Freq: Every day | ORAL | Status: DC
  Administered 2016-09-19 – 2016-09-20 (×2): 10 mg via ORAL
  Filled 2016-09-18 (×2): qty 1

## 2016-09-18 MED ORDER — LEVOTHYROXINE SODIUM 75 MCG PO TABS
150.0000 ug | ORAL_TABLET | Freq: Every day | ORAL | Status: DC
  Administered 2016-09-19 – 2016-09-20 (×2): 150 ug via ORAL
  Filled 2016-09-18 (×2): qty 2

## 2016-09-18 MED ORDER — FLEET ENEMA 7-19 GM/118ML RE ENEM: 1.0000 | ENEMA | Freq: Once | RECTAL | Status: DC | PRN

## 2016-09-18 MED ORDER — MIDAZOLAM HCL 2 MG/2ML IJ SOLN
INTRAMUSCULAR | Status: AC
  Administered 2016-09-18 (×2): 1 mg
  Filled 2016-09-18: qty 2

## 2016-09-18 MED ORDER — TRANEXAMIC ACID 1000 MG/10ML IV SOLN
1000.0000 mg | Freq: Once | INTRAVENOUS | Status: AC
  Administered 2016-09-18: 16:00:00 1000 mg via INTRAVENOUS
  Filled 2016-09-18: qty 1100

## 2016-09-18 MED ORDER — PHENYLEPHRINE 40 MCG/ML (10ML) SYRINGE FOR IV PUSH (FOR BLOOD PRESSURE SUPPORT)
PREFILLED_SYRINGE | INTRAVENOUS | Status: DC | PRN
  Administered 2016-09-18 (×3): 80 ug via INTRAVENOUS

## 2016-09-18 MED ORDER — BUPIVACAINE LIPOSOME 1.3 % IJ SUSP
INTRAMUSCULAR | Status: DC | PRN
  Administered 2016-09-18: 20 mL

## 2016-09-18 MED ORDER — SODIUM CHLORIDE 0.9 % IR SOLN
Status: DC | PRN
  Administered 2016-09-18: 1000 mL

## 2016-09-18 MED ORDER — LIDOCAINE 2% (20 MG/ML) 5 ML SYRINGE
INTRAMUSCULAR | Status: DC | PRN
  Administered 2016-09-18: 50 mg via INTRAVENOUS

## 2016-09-18 MED ORDER — ACETAMINOPHEN 10 MG/ML IV SOLN
INTRAVENOUS | Status: AC
  Filled 2016-09-18: qty 100

## 2016-09-18 MED ORDER — ONDANSETRON HCL 4 MG/2ML IJ SOLN
4.0000 mg | Freq: Four times a day (QID) | INTRAMUSCULAR | Status: DC | PRN
Start: 2016-09-18 — End: 2016-09-20

## 2016-09-18 MED ORDER — HYDROMORPHONE HCL 1 MG/ML IJ SOLN
0.2500 mg | INTRAMUSCULAR | Status: DC | PRN
  Filled 2016-09-18: qty 0.5

## 2016-09-18 MED ORDER — FENTANYL CITRATE (PF) 100 MCG/2ML IJ SOLN
INTRAMUSCULAR | Status: AC
  Filled 2016-09-18: qty 2

## 2016-09-18 MED ORDER — ACETAMINOPHEN 325 MG PO TABS
650.0000 mg | ORAL_TABLET | Freq: Four times a day (QID) | ORAL | Status: DC | PRN
  Administered 2016-09-19 – 2016-09-20 (×2): 650 mg via ORAL
  Filled 2016-09-18 (×3): qty 2

## 2016-09-18 MED ORDER — TRANEXAMIC ACID 1000 MG/10ML IV SOLN
1000.0000 mg | INTRAVENOUS | Status: AC
  Administered 2016-09-18: 1000 mg via INTRAVENOUS
  Filled 2016-09-18: qty 1100

## 2016-09-18 MED ORDER — SODIUM CHLORIDE 0.9 % IJ SOLN
INTRAMUSCULAR | Status: DC | PRN
  Administered 2016-09-18: 60 mL

## 2016-09-18 MED ORDER — LIDOCAINE 2% (20 MG/ML) 5 ML SYRINGE
INTRAMUSCULAR | Status: AC
  Filled 2016-09-18: qty 5

## 2016-09-18 MED ORDER — ACETAMINOPHEN 650 MG RE SUPP: 650.0000 mg | Freq: Four times a day (QID) | RECTAL | Status: DC | PRN

## 2016-09-18 MED ORDER — BISACODYL 10 MG RE SUPP: 10.0000 mg | Freq: Every day | RECTAL | Status: DC | PRN

## 2016-09-18 MED ORDER — PHENYLEPHRINE 40 MCG/ML (10ML) SYRINGE FOR IV PUSH (FOR BLOOD PRESSURE SUPPORT)
PREFILLED_SYRINGE | INTRAVENOUS | Status: AC
  Filled 2016-09-18: qty 10

## 2016-09-18 MED ORDER — DEXTROSE 5 % IV SOLN
500.0000 mg | Freq: Four times a day (QID) | INTRAVENOUS | Status: DC | PRN
  Administered 2016-09-18: 500 mg via INTRAVENOUS
  Filled 2016-09-18: qty 550

## 2016-09-18 MED ORDER — DEXAMETHASONE SODIUM PHOSPHATE 10 MG/ML IJ SOLN
INTRAMUSCULAR | Status: AC
  Filled 2016-09-18: qty 1

## 2016-09-18 MED ORDER — MIDAZOLAM HCL 5 MG/ML IJ SOLN: 2.0000 mg | Freq: Once | INTRAMUSCULAR | Status: DC

## 2016-09-18 MED ORDER — DEXAMETHASONE SODIUM PHOSPHATE 10 MG/ML IJ SOLN
10.0000 mg | Freq: Once | INTRAMUSCULAR | Status: AC
  Administered 2016-09-18: 10 mg via INTRAVENOUS

## 2016-09-18 MED ORDER — TRAMADOL HCL 50 MG PO TABS
50.0000 mg | ORAL_TABLET | Freq: Four times a day (QID) | ORAL | Status: DC | PRN
  Administered 2016-09-19 – 2016-09-20 (×2): 100 mg via ORAL
  Filled 2016-09-18 (×2): qty 2

## 2016-09-18 MED ORDER — MIDAZOLAM HCL 5 MG/5ML IJ SOLN
INTRAMUSCULAR | Status: DC | PRN
Start: 2016-09-18 — End: 2016-09-18
  Administered 2016-09-18: 2 mg via INTRAVENOUS

## 2016-09-18 MED ORDER — GABAPENTIN 300 MG PO CAPS
300.0000 mg | ORAL_CAPSULE | Freq: Once | ORAL | Status: AC
  Administered 2016-09-18: 300 mg via ORAL

## 2016-09-18 MED ORDER — POLYETHYLENE GLYCOL 3350 17 G PO PACK: 17.0000 g | PACK | Freq: Every day | ORAL | Status: DC | PRN

## 2016-09-18 MED ORDER — ACETAMINOPHEN 500 MG PO TABS
1000.0000 mg | ORAL_TABLET | Freq: Four times a day (QID) | ORAL | Status: AC
  Administered 2016-09-18 – 2016-09-19 (×4): 1000 mg via ORAL
  Filled 2016-09-18 (×4): qty 2

## 2016-09-18 MED ORDER — GABAPENTIN 300 MG PO CAPS
ORAL_CAPSULE | ORAL | Status: AC
  Filled 2016-09-18: qty 1

## 2016-09-18 SURGICAL SUPPLY — 51 items
BAG DECANTER FOR FLEXI CONT (MISCELLANEOUS) ×3 IMPLANT
BAG ZIPLOCK 12X15 (MISCELLANEOUS) ×3 IMPLANT
BANDAGE ACE 6X5 VEL STRL LF (GAUZE/BANDAGES/DRESSINGS) ×3 IMPLANT
BANDAGE ELASTIC 6 VELCRO ST LF (GAUZE/BANDAGES/DRESSINGS) ×3 IMPLANT
BLADE SAG 18X100X1.27 (BLADE) ×3 IMPLANT
BLADE SAW SGTL 11.0X1.19X90.0M (BLADE) ×3 IMPLANT
BOWL SMART MIX CTS (DISPOSABLE) ×3 IMPLANT
CAPT KNEE TOTAL 3 ATTUNE ×3 IMPLANT
CEMENT HV SMART SET (Cement) ×6 IMPLANT
CLOSURE WOUND 1/2 X4 (GAUZE/BANDAGES/DRESSINGS) ×1
COVER SURGICAL LIGHT HANDLE (MISCELLANEOUS) ×3 IMPLANT
CUFF TOURN SGL QUICK 34 (TOURNIQUET CUFF) ×2
CUFF TRNQT CYL 34X4X40X1 (TOURNIQUET CUFF) ×1 IMPLANT
DECANTER SPIKE VIAL GLASS SM (MISCELLANEOUS) ×3 IMPLANT
DRAPE U-SHAPE 47X51 STRL (DRAPES) ×3 IMPLANT
DRSG ADAPTIC 3X8 NADH LF (GAUZE/BANDAGES/DRESSINGS) ×3 IMPLANT
DRSG PAD ABDOMINAL 8X10 ST (GAUZE/BANDAGES/DRESSINGS) ×3 IMPLANT
DURAPREP 26ML APPLICATOR (WOUND CARE) ×3 IMPLANT
ELECT REM PT RETURN 15FT ADLT (MISCELLANEOUS) ×3 IMPLANT
EVACUATOR 1/8 PVC DRAIN (DRAIN) ×3 IMPLANT
GAUZE SPONGE 4X4 12PLY STRL (GAUZE/BANDAGES/DRESSINGS) ×3 IMPLANT
GLOVE BIO SURGEON STRL SZ7.5 (GLOVE) IMPLANT
GLOVE BIO SURGEON STRL SZ8 (GLOVE) ×3 IMPLANT
GLOVE BIOGEL PI IND STRL 6.5 (GLOVE) IMPLANT
GLOVE BIOGEL PI IND STRL 8 (GLOVE) ×1 IMPLANT
GLOVE BIOGEL PI INDICATOR 6.5 (GLOVE)
GLOVE BIOGEL PI INDICATOR 8 (GLOVE) ×2
GLOVE SURG SS PI 6.5 STRL IVOR (GLOVE) IMPLANT
GOWN STRL REUS W/TWL LRG LVL3 (GOWN DISPOSABLE) ×3 IMPLANT
GOWN STRL REUS W/TWL XL LVL3 (GOWN DISPOSABLE) IMPLANT
HANDPIECE INTERPULSE COAX TIP (DISPOSABLE) ×2
IMMOBILIZER KNEE 20 (SOFTGOODS) ×3
IMMOBILIZER KNEE 20 THIGH 36 (SOFTGOODS) ×1 IMPLANT
MANIFOLD NEPTUNE II (INSTRUMENTS) ×3 IMPLANT
NS IRRIG 1000ML POUR BTL (IV SOLUTION) ×3 IMPLANT
PACK TOTAL KNEE CUSTOM (KITS) ×3 IMPLANT
PAD ABD 8X10 STRL (GAUZE/BANDAGES/DRESSINGS) ×3 IMPLANT
PADDING CAST COTTON 6X4 STRL (CAST SUPPLIES) ×3 IMPLANT
POSITIONER SURGICAL ARM (MISCELLANEOUS) ×3 IMPLANT
SET HNDPC FAN SPRY TIP SCT (DISPOSABLE) ×1 IMPLANT
STRIP CLOSURE SKIN 1/2X4 (GAUZE/BANDAGES/DRESSINGS) ×2 IMPLANT
SUT MNCRL AB 4-0 PS2 18 (SUTURE) ×3 IMPLANT
SUT STRATAFIX 0 PDS 27 VIOLET (SUTURE) ×3
SUT VIC AB 2-0 CT1 27 (SUTURE) ×6
SUT VIC AB 2-0 CT1 TAPERPNT 27 (SUTURE) ×3 IMPLANT
SUTURE STRATFX 0 PDS 27 VIOLET (SUTURE) ×1 IMPLANT
SYR 50ML LL SCALE MARK (SYRINGE) IMPLANT
TRAY FOLEY W/METER SILVER 16FR (SET/KITS/TRAYS/PACK) ×3 IMPLANT
WATER STERILE IRR 1000ML POUR (IV SOLUTION) ×6 IMPLANT
WRAP KNEE MAXI GEL POST OP (GAUZE/BANDAGES/DRESSINGS) ×3 IMPLANT
YANKAUER SUCT BULB TIP 10FT TU (MISCELLANEOUS) ×3 IMPLANT

## 2016-09-18 NOTE — Anesthesia Procedure Notes (Addendum)
Anesthesia Regional Block: Adductor canal block   Pre-Anesthetic Checklist: ,, timeout performed, Correct Patient, Correct Site, Correct Laterality, Correct Procedure, Correct Position, site marked, Risks and benefits discussed, pre-op evaluation,  At surgeon's request and post-op pain management  Laterality: Right  Prep: Maximum Sterile Barrier Precautions used, chloraprep       Needles:  Injection technique: Single-shot  Needle Type: Echogenic Stimulator Needle     Needle Length: 9cm  Needle Gauge: 21     Additional Needles:   Procedures: ultrasound guided,,,,,,,,  Narrative:  Start time: 09/18/2016 9:27 AM End time: 09/18/2016 9:37 AM Injection made incrementally with aspirations every 5 mL. Anesthesiologist: Roderic Palau  Additional Notes: 2% Lidocaine skin wheel.

## 2016-09-18 NOTE — Anesthesia Postprocedure Evaluation (Signed)
Anesthesia Post Note  Patient: Dalton Murphy  Procedure(s) Performed: Procedure(s) (LRB): RIGHT TOTAL KNEE ARTHROPLASTY (Right)     Patient location during evaluation: PACU Anesthesia Type: Spinal and Regional Level of consciousness: awake and alert Pain management: pain level controlled Vital Signs Assessment: post-procedure vital signs reviewed and stable Respiratory status: spontaneous breathing and respiratory function stable Cardiovascular status: blood pressure returned to baseline and stable Postop Assessment: spinal receding Anesthetic complications: no    Last Vitals:  Vitals:   09/18/16 1330 09/18/16 1345  BP: 119/73 123/68  Pulse: (!) 53 (!) 54  Resp: 12 15  Temp: 36.6 C 36.6 C    Last Pain:  Vitals:   09/18/16 0900  TempSrc:   PainSc: 0-No pain    LLE Motor Response: (P) Purposeful movement (09/18/16 1400) LLE Sensation: (P) No pain;Numbness;Tingling (09/18/16 1400) RLE Motor Response: (P) Purposeful movement (09/18/16 1400) RLE Sensation: (P) Numbness;Tingling;No pain (09/18/16 1400) L Sensory Level: (P) S1-Sole of foot, small toes (09/18/16 1400) R Sensory Level: (P) S1-Sole of foot, small toes (09/18/16 1400)  [place] Hagger,W. EDMOND

## 2016-09-18 NOTE — Transfer of Care (Signed)
Immediate Anesthesia Transfer of Care Note  Patient: Dalton Murphy  Procedure(s) Performed: Procedure(s): RIGHT TOTAL KNEE ARTHROPLASTY (Right)  Patient Location: PACU  Anesthesia Type:Regional and Spinal  Level of Consciousness: awake, alert  and oriented  Airway & Oxygen Therapy: Patient Spontanous Breathing and Patient connected to nasal cannula oxygen  Post-op Assessment: Report given to RN and Post -op Vital signs reviewed and stable  Post vital signs: Reviewed and stable  Last Vitals:  Vitals:   09/18/16 1000 09/18/16 1001  BP:  (!) 132/93  Pulse: (!) 59 (!) 59  Resp: 15   Temp:      Last Pain:  Vitals:   09/18/16 0900  TempSrc:   PainSc: 0-No pain      Patients Stated Pain Goal: 3 (32/12/24 8250)  Complications: No apparent anesthesia complications

## 2016-09-18 NOTE — Op Note (Signed)
OPERATIVE REPORT-TOTAL KNEE ARTHROPLASTY   Pre-operative diagnosis- Osteoarthritis  Right knee(s)  Post-operative diagnosis- Osteoarthritis Right knee(s)  Procedure-  Right  Total Knee Arthroplasty (Depuy Attune)  Surgeon- Dione Plover. Dareion Kneece, MD  Assistant- Arlee Muslim, PA-C   Anesthesia-  Adductor canal block and spinal  EBL-* No blood loss amount entered *   Drains Hemovac  Tourniquet time-  Total Tourniquet Time Documented: Thigh (Right) - 37 minutes Total: Thigh (Right) - 37 minutes     Complications- None  Condition-PACU - hemodynamically stable.   Brief Clinical Note  Dalton Murphy is a 66 y.o. year old male with end stage OA of his right knee with progressively worsening pain and dysfunction. He has constant pain, with activity and at rest and significant functional deficits with difficulties even with ADLs. He has had extensive non-op management including analgesics, injections of cortisone, and home exercise program, but remains in significant pain with significant dysfunction. Radiographs show bone on bone arthritis medial and patellofemoral with tibial subluxation. He presents now for right Total Knee Arthroplasty.    Procedure in detail---   The patient is brought into the operating room and positioned supine on the operating table. After successful administration of   Adductor canal block and spinal,   a tourniquet is placed high on the  Right thigh(s) and the lower extremity is prepped and draped in the usual sterile fashion. Time out is performed by the operating team and then the  Right lower extremity is wrapped in Esmarch, knee flexed and the tourniquet inflated to 300 mmHg.       A midline incision is made with a ten blade through the subcutaneous tissue to the level of the extensor mechanism. A fresh blade is used to make a medial parapatellar arthrotomy. Soft tissue over the proximal medial tibia is subperiosteally elevated to the joint line with a knife  and into the semimembranosus bursa with a Cobb elevator. Soft tissue over the proximal lateral tibia is elevated with attention being paid to avoiding the patellar tendon on the tibial tubercle. The patella is everted, knee flexed 90 degrees and the ACL and PCL are removed. Findings are bone on bone medial and patellofemoral with massive global osteophytes.        The drill is used to create a starting hole in the distal femur and the canal is thoroughly irrigated with sterile saline to remove the fatty contents. The 5 degree Right  valgus alignment guide is placed into the femoral canal and the distal femoral cutting block is pinned to remove 10 mm off the distal femur. Resection is made with an oscillating saw.      The tibia is subluxed forward and the menisci are removed. The extramedullary alignment guide is placed referencing proximally at the medial aspect of the tibial tubercle and distally along the second metatarsal axis and tibial crest. The block is pinned to remove 7mm off the more deficient medial  side. Resection is made with an oscillating saw. Size 7is the most appropriate size for the tibia and the proximal tibia is prepared with the modular drill and keel punch for that size.      The femoral sizing guide is placed and size 8 is most appropriate. Rotation is marked off the epicondylar axis and confirmed by creating a rectangular flexion gap at 90 degrees. The size 8 cutting block is pinned in this rotation and the anterior, posterior and chamfer cuts are made with the oscillating saw. The intercondylar block  is then placed and that cut is made.      Trial size 7 tibial component, trial size 8 posterior stabilized femur and a 8  mm posterior stabilized rotating platform insert trial is placed. Full extension is achieved with excellent varus/valgus and anterior/posterior balance throughout full range of motion. The patella is everted and thickness measured to be 27  mm. Free hand resection is  taken to 15 mm, a 41 template is placed, lug holes are drilled, trial patella is placed, and it tracks normally. Osteophytes are removed off the posterior femur with the trial in place. All trials are removed and the cut bone surfaces prepared with pulsatile lavage. Cement is mixed and once ready for implantation, the size 7 tibial implant, size  8 posterior stabilized femoral component, and the size 41 patella are cemented in place and the patella is held with the clamp. The trial insert is placed and the knee held in full extension. The Exparel (20 ml mixed with 60 ml saline) is injected into the extensor mechanism, posterior capsule, medial and lateral gutters and subcutaneous tissues.  All extruded cement is removed and once the cement is hard the permanent 8 mm posterior stabilized rotating platform insert is placed into the tibial tray.      The wound is copiously irrigated with saline solution and the extensor mechanism closed over a hemovac drain with #1 V-loc suture. The tourniquet is released for a total tourniquet time of 37  minutes. Flexion against gravity is 140 degrees and the patella tracks normally. Subcutaneous tissue is closed with 2.0 vicryl and subcuticular with running 4.0 Monocryl. The incision is cleaned and dried and steri-strips and a bulky sterile dressing are applied. The limb is placed into a knee immobilizer and the patient is awakened and transported to recovery in stable condition.      Please note that a surgical assistant was a medical necessity for this procedure in order to perform it in a safe and expeditious manner. Surgical assistant was necessary to retract the ligaments and vital neurovascular structures to prevent injury to them and also necessary for proper positioning of the limb to allow for anatomic placement of the prosthesis.   Dione Plover Maralyn Witherell, MD    09/18/2016, 11:24 AM

## 2016-09-18 NOTE — Anesthesia Procedure Notes (Signed)
Spinal  Patient location during procedure: OR End time: 09/18/2016 10:23 AM Staffing Resident/CRNA: Noralyn Pick D Performed: resident/CRNA  Preanesthetic Checklist Completed: patient identified, site marked, surgical consent, pre-op evaluation, timeout performed, IV checked, risks and benefits discussed and monitors and equipment checked Spinal Block Patient position: sitting Prep: Betadine Patient monitoring: heart rate, continuous pulse ox and blood pressure Approach: midline Location: L3-4 Injection technique: single-shot Needle Needle type: Sprotte  Needle gauge: 24 G Needle length: 9 cm Assessment Sensory level: T6 Additional Notes Expiration date of kit checked and confirmed. Patient tolerated procedure well, without complications.

## 2016-09-18 NOTE — H&P (View-Only) (Signed)
Dalton Murphy DOB: 02/15/1951 Married / Language: English / Race: White Male Date of Admission:  09/18/2016 CC:  Right Knee Pain History of Present Illness  The patient is a 66 year old male who comes in for a preoperative History and Physical. The patient is scheduled for a right total knee arthroplasty to be performed by Dr. Dione Plover. Aluisio, MD at [facility] on 09/18/2016. The patient is a 66 year old male who presented to the practice and reported left knee and right knee symptoms including pain which began 10 year(s) ago without any known injury. Patient states that both knees hurt, but the right is worse. He said that both knees hurt all throughout, no localized area. He takes Tramadol for the knee pain, but was originally prescribed for his gout. His pain is better with rest, but hurts with movement or weightbearing. He said that he can hear a crunching sound when walking. He has had problems with his knees for very long time now. He has gotten progressively worse over the past year. Both knees hurt but the right is more symptomatic than the left. It hurts at all times. It is limiting what he can and cannot do. He would like to be more active but cannot do so because of the knee pain. He said that he even worsen the pain or his problems with function. He can tolerate the pain with the function but it is really giving him the most trouble. He does not have any associated hip pain, lower extremity weakness or paresthesia. The knee occasionally will give out on him also. Radiographs in the office, AP and lateral both knees show that right knee has bone on bone arthritis, medial and patellofemoral. Left knee has arthritic changes also but not as advanced as the right. He has advanced arthritis both knees, right worse than left with symptoms worse on the right. It is not just pain, he is having a lot of functional issues also. Injections will not help with any of the functional issues. He is at a  stage where he wants to get more active and be able to do things. He would like a more permanent solution to this. The most predictive means of improving his pain and function is going to be total knee arthroplasty. We did discuss that in detail. He has such significant functional problems that I really do not think injections are indicated. We will proceed surgery on the right knee at this time. They have been treated conservatively in the past for the above stated problem and despite conservative measures, they continue to have progressive pain and severe functional limitations and dysfunction. They have failed non-operative management including home exercise, medications. It is felt that they would benefit from undergoing total joint replacement. Risks and benefits of the procedure have been discussed with the patient and they elect to proceed with surgery. There are no active contraindications to surgery such as ongoing infection or rapidly progressive neurological disease.    Problem List/Past Medical  Primary osteoarthritis of right knee (M17.11)  Lumbago (M54.5) [09/10/2002]: Gout  Hypothyroidism  Sleep Apnea  uses CPAP Degenerative Disc Disease  Lumbar  Allergies  No Known Allergies   Family History Chronic Obstructive Lung Disease  brother Diabetes Mellitus  brother Hypertension  mother Osteoarthritis  mother  Social History Alcohol use  current drinker; drinks beer; only occasionally per week Children  2 Current work status  working full time Drug/Alcohol Rehab (Currently)  no Drug/Alcohol Rehab (  Previously)  no Exercise  Exercises daily; does running / walking Illicit drug use  no Living situation  live with spouse Marital status  married Number of flights of stairs before winded  4-5 Pain Contract  no Tobacco / smoke exposure  no Tobacco use  former smoker; smoke(d) less than 1/2 pack(s) per day Advance Directives  Living Will, Healthcare  POA  Medication History TraMADol HCl (50MG  Tablet, Oral) Active. Diclofenac Sodium (75MG  Tablet DR, Oral) Active. Levothyroxine Sodium (137MCG Tablet, Oral) Active.  Past Surgical History Colon Polyp Removal - Colonoscopy  Vasectomy    Review of Systems  General Not Present- Chills, Fatigue, Fever, Memory Loss, Night Sweats, Weight Gain and Weight Loss. Skin Not Present- Eczema, Hives, Itching, Lesions and Rash. HEENT Not Present- Dentures, Double Vision, Headache, Hearing Loss, Tinnitus and Visual Loss. Respiratory Not Present- Allergies, Chronic Cough, Coughing up blood, Shortness of breath at rest and Shortness of breath with exertion. Cardiovascular Not Present- Chest Pain, Difficulty Breathing Lying Down, Murmur, Palpitations, Racing/skipping heartbeats and Swelling. Gastrointestinal Not Present- Abdominal Pain, Bloody Stool, Constipation, Diarrhea, Difficulty Swallowing, Heartburn, Jaundice, Loss of appetitie, Nausea and Vomiting. Male Genitourinary Present- Urinary frequency. Not Present- Blood in Urine, Discharge, Flank Pain, Incontinence, Painful Urination, Urgency, Urinary Retention, Urinating at Night and Weak urinary stream. Musculoskeletal Present- Joint Pain, Joint Swelling and Morning Stiffness. Not Present- Back Pain, Muscle Pain, Muscle Weakness and Spasms. Neurological Not Present- Blackout spells, Difficulty with balance, Dizziness, Paralysis, Tremor and Weakness. Psychiatric Not Present- Insomnia.  Vitals  Weight: 240 lb Height: 71in Weight was reported by patient. Height was reported by patient. Body Surface Area: 2.28 m Body Mass Index: 33.47 kg/m  Pulse: 64 (Regular)  BP: 114/64 (Sitting, Right Arm, Standard)  Physical Exam General Mental Status -Alert, cooperative and good historian. General Appearance-pleasant, Not in acute distress. Orientation-Oriented X3. Build & Nutrition-Well nourished and Well developed.  Head and  Neck Head-normocephalic, atraumatic . Neck Global Assessment - supple, no bruit auscultated on the right, no bruit auscultated on the left.  Eye Vision-Wears corrective lenses(readers only). Pupil - Bilateral-Regular and Round. Motion - Bilateral-EOMI.  Chest and Lung Exam Auscultation Breath sounds - clear at anterior chest wall and clear at posterior chest wall. Adventitious sounds - No Adventitious sounds.  Cardiovascular Auscultation Rhythm - Regular rate and rhythm. Heart Sounds - S1 WNL and S2 WNL. Murmurs & Other Heart Sounds - Auscultation of the heart reveals - No Murmurs.  Abdomen Palpation/Percussion Tenderness - Abdomen is non-tender to palpation. Rigidity (guarding) - Abdomen is soft. Auscultation Auscultation of the abdomen reveals - Bowel sounds normal.  Male Genitourinary Note: Not done, not pertinent to present illness   Musculoskeletal Note: Well developed male, in no distress. Both hips have normal motion, no discomfort. His left knee shows no effusion. Range of motion on the left is about 0 to 130. There is some tenderness medially. There is no lateral tenderness or any instability. Right knee, no effusion, range 5 to 125. There is moderate crepitus on range of motion. He is very tender medially. There is no lateral tenderness. There is no instability noted.  RADIOGRAPHS AP and lateral both knees show that right knee has bone on bone arthritis, medial and patellofemoral. Left knee has arthritic changes also but not as advanced as the right.  Assessment & Plan  Primary osteoarthritis of right knee (M17.11)  Note:Surgical Plans: Right Total Knee Replacement  Disposition: Home, straight to outpatient therapy at [facility]  PCP: Dr. Quay Burow - Patient has  been seen preoperatively and felt to be stable for surgery.  IV TXA  Patient was instructed on what medications to stop prior to surgery.  Signed electronically by Joelene Millin, III  PA-C

## 2016-09-18 NOTE — Evaluation (Signed)
Physical Therapy Evaluation Patient Details Name: Dalton Murphy MRN: [id number] DOB: 1950/06/20 Today's Date: 09/18/2016   History of Present Illness  rt tka  Clinical Impression  Then patient ambulated x 50'. Required frequent cues for safety.  Pt admitted with above diagnosis. Pt currently with functional limitations due to the deficits listed below (see PT Problem List). Pt will benefit from skilled PT to increase their independence and safety with mobility to allow discharge to the venue listed below.       Follow Up Recommendations Outpatient PT;Supervision/Assistance - 24 hour    Equipment Recommendations  None recommended by PT    Recommendations for Other Services       Precautions / Restrictions Precautions Precautions: Fall;Knee Required Braces or Orthoses: Knee Immobilizer - Right      Mobility  Bed Mobility Overal bed mobility: Needs Assistance Bed Mobility: Supine to Sit     Supine to sit: Min assist        Transfers Overall transfer level: Needs assistance Equipment used: Rolling walker (2 wheeled) Transfers: Sit to/from Stand Sit to Stand: From elevated surface         General transfer comment: cues for hand palcement and right leg  Ambulation/Gait Ambulation/Gait assistance: Min assist;+2 safety/equipment Ambulation Distance (Feet): 50 Feet Assistive device: Rolling walker (2 wheeled) Gait Pattern/deviations: Step-to pattern;Antalgic     General Gait Details: cues for sequence  Stairs            Wheelchair Mobility    Modified Rankin (Stroke Patients Only)       Balance                                             Pertinent Vitals/Pain Pain Assessment: 0-10 Pain Score: 5  Pain Location: rt knee Pain Descriptors / Indicators: Aching Pain Intervention(s): Premedicated before session;Repositioned;Patient requesting pain meds-RN notified;Ice applied    Home Living Family/patient expects to be discharged  to:: Private residence Living Arrangements: Spouse/significant other Available Help at Discharge: Family Type of Home: House Home Access: Stairs to enter   Technical brewer of Steps: 3 Home Layout: One level Home Equipment: Environmental consultant - 2 wheels      Prior Function                 Hand Dominance        Extremity/Trunk Assessment   Upper Extremity Assessment Upper Extremity Assessment: Defer to OT evaluation    Lower Extremity Assessment Lower Extremity Assessment: RLE deficits/detail RLE Deficits / Details: lacks 20 * knee extension       Communication      Cognition Arousal/Alertness: Awake/alert Behavior During Therapy: WFL for tasks assessed/performed Overall Cognitive Status: Difficult to assess                                 General Comments: asked same questions about therpy tomorrow, how many hours, just was a bit off in thinking processing/      General Comments      Exercises     Assessment/Plan    PT Assessment Patient needs continued PT services  PT Problem List Decreased strength;Decreased range of motion;Decreased activity tolerance;Decreased mobility;Decreased knowledge of precautions;Decreased safety awareness;Decreased knowledge of use of DME       PT Treatment Interventions DME instruction;Gait training;Stair training;Functional mobility  training;Therapeutic activities;Therapeutic exercise;Patient/family education    PT Goals (Current goals can be found in the Care Plan section)  Acute Rehab PT Goals Patient Stated Goal: walk PT Goal Formulation: With patient Time For Goal Achievement: 09/25/16 Potential to Achieve Goals: Good    Frequency 7X/week   Barriers to discharge        Co-evaluation               AM-PAC PT "6 Clicks" Daily Activity  Outcome Measure Difficulty turning over in bed (including adjusting bedclothes, sheets and blankets)?: A Little Difficulty moving from lying on back to sitting  on the side of the bed? : A Little Difficulty sitting down on and standing up from a chair with arms (e.g., wheelchair, bedside commode, etc,.)?: A Little Help needed moving to and from a bed to chair (including a wheelchair)?: A Little Help needed walking in hospital room?: A Little Help needed climbing 3-5 steps with a railing? : A Lot 6 Click Score: 17    End of Session Equipment Utilized During Treatment: Right knee immobilizer Activity Tolerance: Patient tolerated treatment well Patient left: in chair;with call bell/phone within reach;with family/visitor present Nurse Communication: Mobility status;Patient requests pain meds PT Visit Diagnosis: Unsteadiness on feet (R26.81);Difficulty in walking, not elsewhere classified (R26.2);Pain Pain - Right/Left: Right Pain - part of body: Knee    Time: 1715-1743 PT Time Calculation (min) (ACUTE ONLY): 28 min   Charges:   PT Evaluation $PT Eval Low Complexity: 1 Procedure PT Treatments $Gait Training: 8-22 mins   PT G CodesTresa Endo PT [phone]   Claretha Cooper 09/18/2016, 6:22 PM

## 2016-09-18 NOTE — Progress Notes (Signed)
AssistedDr. Edmond Fitzgerald with right, ultrasound guided, adductor canal block. Side rails up, monitors on throughout procedure. See vital signs in flow sheet. Tolerated Procedure well.  

## 2016-09-18 NOTE — Interval H&P Note (Signed)
History and Physical Interval Note:  09/18/2016 8:23 AM  Dalton Murphy  has presented today for surgery, with the diagnosis of Osteoarthritis Right Knee  The various methods of treatment have been discussed with the patient and family. After consideration of risks, benefits and other options for treatment, the patient has consented to  Procedure(s): RIGHT TOTAL KNEE ARTHROPLASTY (Right) as a surgical intervention .  The patient's history has been reviewed, patient examined, no change in status, stable for surgery.  I have reviewed the patient's chart and labs.  Questions were answered to the patient's satisfaction.     Gearlean Alf

## 2016-09-18 NOTE — Progress Notes (Signed)
Pt states that he will self administer CPAP when ready for bed.  RT to monitor and assess as needed.  

## 2016-09-19 LAB — CBC
HEMATOCRIT: 36.3 % — AB (ref 39.0–52.0)
HEMOGLOBIN: 12.6 g/dL — AB (ref 13.0–17.0)
MCH: 30.3 pg (ref 26.0–34.0)
MCHC: 34.7 g/dL (ref 30.0–36.0)
MCV: 87.3 fL (ref 78.0–100.0)
PLATELETS: 234 10*3/uL (ref 150–400)
RBC: 4.16 MIL/uL — AB (ref 4.22–5.81)
RDW: 13 % (ref 11.5–15.5)
WBC: 13 10*3/uL — AB (ref 4.0–10.5)

## 2016-09-19 LAB — BASIC METABOLIC PANEL
ANION GAP: 7 (ref 5–15)
BUN: 15 mg/dL (ref 6–20)
CO2: 26 mmol/L (ref 22–32)
Calcium: 8.8 mg/dL — ABNORMAL LOW (ref 8.9–10.3)
Chloride: 103 mmol/L (ref 101–111)
Creatinine, Ser: 0.62 mg/dL (ref 0.61–1.24)
GFR calc Af Amer: >60 mL/min (ref >60)
GLUCOSE: 137 mg/dL — AB (ref 65–99)
POTASSIUM: 4 mmol/L (ref 3.5–5.1)
Sodium: 136 mmol/L (ref 135–145)

## 2016-09-19 MED ORDER — TRAMADOL HCL 50 MG PO TABS: 50.0000 mg | ORAL_TABLET | Freq: Four times a day (QID) | ORAL | 0 refills | Status: DC | PRN

## 2016-09-19 MED ORDER — OXYCODONE HCL 5 MG PO TABS: 5.0000 mg | ORAL_TABLET | ORAL | 0 refills | Status: DC | PRN

## 2016-09-19 MED ORDER — METHOCARBAMOL 500 MG PO TABS: 500.0000 mg | ORAL_TABLET | Freq: Four times a day (QID) | ORAL | 0 refills | Status: DC | PRN

## 2016-09-19 MED ORDER — RIVAROXABAN 10 MG PO TABS: 10.0000 mg | ORAL_TABLET | Freq: Every day | ORAL | 0 refills | Status: DC

## 2016-09-19 NOTE — Discharge Instructions (Addendum)
°  ° °Dr. Frank Aluisio °Total Joint Specialist °[facility] °[address] °[place], [place] [address] °[phone] ° °TOTAL KNEE REPLACEMENT POSTOPERATIVE DIRECTIONS ° °Knee Rehabilitation, Guidelines Following Surgery  °Results after knee surgery are often greatly improved when you follow the exercise, range of motion and muscle strengthening exercises prescribed by your doctor. Safety measures are also important to protect the knee from further injury. Any time any of these exercises cause you to have increased pain or swelling in your knee joint, decrease the amount until you are comfortable again and slowly increase them. If you have problems or questions, call your caregiver or physical therapist for advice.  ° °HOME CARE INSTRUCTIONS  °Remove items at home which could result in a fall. This includes throw rugs or furniture in walking pathways.  °· ICE to the affected knee every three hours for 30 minutes at a time and then as needed for pain and swelling.  Continue to use ice on the knee for pain and swelling from surgery. You may notice swelling that will progress down to the foot and ankle.  This is normal after surgery.  Elevate the leg when you are not up walking on it.   °· Continue to use the breathing machine which will help keep your temperature down.  It is common for your temperature to cycle up and down following surgery, especially at night when you are not up moving around and exerting yourself.  The breathing machine keeps your lungs expanded and your temperature down. °· Do not place pillow under knee, focus on keeping the knee straight while resting ° °DIET °You may resume your previous home diet once your are discharged from the hospital. ° °DRESSING / WOUND CARE / SHOWERING °You may shower 3 days after surgery, but keep the wounds dry during showering.  You may use an occlusive plastic wrap (Press'n Seal for example), NO SOAKING/SUBMERGING IN THE BATHTUB.  If the  bandage gets wet, change with a clean dry gauze.  If the incision gets wet, pat the wound dry with a clean towel. °You may start showering once you are discharged home but do not submerge the incision under water. Just pat the incision dry and apply a dry gauze dressing on daily. °Change the surgical dressing daily and reapply a dry dressing each time. ° °ACTIVITY °Walk with your walker as instructed. °Use walker as long as suggested by your caregivers. °Avoid periods of inactivity such as sitting longer than an hour when not asleep. This helps prevent blood clots.  °You may resume a sexual relationship in one month or when given the OK by your doctor.  °You may return to work once you are cleared by your doctor.  °Do not drive a car for 6 weeks or until released by you surgeon.  °Do not drive while taking narcotics. ° °WEIGHT BEARING °Weight bearing as tolerated with assist device (walker, cane, etc) as directed, use it as long as suggested by your surgeon or therapist, typically at least 4-6 weeks. ° °POSTOPERATIVE CONSTIPATION PROTOCOL °Constipation - defined medically as fewer than three stools per week and severe constipation as less than one stool per week. ° °One of the most common issues patients have following surgery is constipation.  Even if you have a regular bowel pattern at home, your normal regimen is likely to be disrupted due to multiple reasons following surgery.  Combination of anesthesia, postoperative narcotics, change in appetite and fluid intake all can affect your   bowels.  In order to avoid complications following surgery, here are some recommendations in order to help you during your recovery period. ° °Colace (docusate) - Pick up an over-the-counter form of Colace or another stool softener and take twice a day as long as you are requiring postoperative pain medications.  Take with a full glass of water daily.  If you experience loose stools or diarrhea, hold the colace until you stool forms  back up.  If your symptoms do not get better within 1 week or if they get worse, check with your doctor. ° °Dulcolax (bisacodyl) - Pick up over-the-counter and take as directed by the product packaging as needed to assist with the movement of your bowels.  Take with a full glass of water.  Use this product as needed if not relieved by Colace only.  ° °MiraLax (polyethylene glycol) - Pick up over-the-counter to have on hand.  MiraLax is a solution that will increase the amount of water in your bowels to assist with bowel movements.  Take as directed and can mix with a glass of water, juice, soda, coffee, or tea.  Take if you go more than two days without a movement. °Do not use MiraLax more than once per day. Call your doctor if you are still constipated or irregular after using this medication for 7 days in a row. ° °If you continue to have problems with postoperative constipation, please contact the office for further assistance and recommendations.  If you experience "the worst abdominal pain ever" or develop nausea or vomiting, please contact the office immediatly for further recommendations for treatment. ° °ITCHING ° If you experience itching with your medications, try taking only a single pain pill, or even half a pain pill at a time.  You can also use Benadryl over the counter for itching or also to help with sleep.  ° °TED HOSE STOCKINGS °Wear the elastic stockings on both legs for three weeks following surgery during the day but you may remove then at night for sleeping. ° °MEDICATIONS °See your medication summary on the “After Visit Summary” that the nursing staff will review with you prior to discharge.  You may have some home medications which will be placed on hold until you complete the course of blood thinner medication.  It is important for you to complete the blood thinner medication as prescribed by your surgeon.  Continue your approved medications as instructed at time of  discharge. ° °PRECAUTIONS °If you experience chest pain or shortness of breath - call 911 immediately for transfer to the hospital emergency department.  °If you develop a fever greater that 101 F, purulent drainage from wound, increased redness or drainage from wound, foul odor from the wound/dressing, or calf pain - CONTACT YOUR SURGEON.   °                                                °FOLLOW-UP APPOINTMENTS °Make sure you keep all of your appointments after your operation with your surgeon and caregivers. You should call the office at the above phone number and make an appointment for approximately two weeks after the date of your surgery or on the date instructed by your surgeon outlined in the "After Visit Summary". ° ° °RANGE OF MOTION AND STRENGTHENING EXERCISES  °Rehabilitation of the knee is important following a knee   injury or an operation. After just a few days of immobilization, the muscles of the thigh which control the knee become weakened and shrink (atrophy). Knee exercises are designed to build up the tone and strength of the thigh muscles and to improve knee motion. Often times heat used for twenty to thirty minutes before working out will loosen up your tissues and help with improving the range of motion but do not use heat for the first two weeks following surgery. These exercises can be done on a training (exercise) mat, on the floor, on a table or on a bed. Use what ever works the best and is most comfortable for you Knee exercises include:  °Leg Lifts - While your knee is still immobilized in a splint or cast, you can do straight leg raises. Lift the leg to 60 degrees, hold for 3 sec, and slowly lower the leg. Repeat 10-20 times 2-3 times daily. Perform this exercise against resistance later as your knee gets better.  °Quad and Hamstring Sets - Tighten up the muscle on the front of the thigh (Quad) and hold for 5-10 sec. Repeat this 10-20 times hourly. Hamstring sets are done by pushing the  foot backward against an object and holding for 5-10 sec. Repeat as with quad sets.  °· Leg Slides: Lying on your back, slowly slide your foot toward your buttocks, bending your knee up off the floor (only go as far as is comfortable). Then slowly slide your foot back down until your leg is flat on the floor again. °· Angel Wings: Lying on your back spread your legs to the side as far apart as you can without causing discomfort.  °A rehabilitation program following serious knee injuries can speed recovery and prevent re-injury in the future due to weakened muscles. Contact your doctor or a physical therapist for more information on knee rehabilitation.  ° °IF YOU ARE TRANSFERRED TO A SKILLED REHAB FACILITY °If the patient is transferred to a skilled rehab facility following release from the hospital, a list of the current medications will be sent to the facility for the patient to continue.  When discharged from the skilled rehab facility, please have the facility set up the patient's Home Health Physical Therapy prior to being released. Also, the skilled facility will be responsible for providing the patient with their medications at time of release from the facility to include their pain medication, the muscle relaxants, and their blood thinner medication. If the patient is still at the rehab facility at time of the two week follow up appointment, the skilled rehab facility will also need to assist the patient in arranging follow up appointment in our office and any transportation needs. ° °MAKE SURE YOU:  °Understand these instructions.  °Get help right away if you are not doing well or get worse.  ° ° °Pick up stool softner and laxative for home use following surgery while on pain medications. °Do not submerge incision under water. °Please use good hand washing techniques while changing dressing each day. °May shower starting three days after surgery. °Please use a clean towel to pat the incision dry following  showers. °Continue to use ice for pain and swelling after surgery. °Do not use any lotions or creams on the incision until instructed by your surgeon. ° °Take Xarelto for two and a half more weeks following discharge from the hospital, then discontinue Xarelto. °Once the patient has completed the blood thinner regimen, then take a Baby 81 mg Aspirin daily   for three more weeks. ° ° °Information on my medicine - XARELTO® (Rivaroxaban) ° ° °Why was Xarelto® prescribed for you? °Xarelto® was prescribed for you to reduce the risk of blood clots forming after orthopedic surgery. The medical term for these abnormal blood clots is venous thromboembolism (VTE). ° °What do you need to know about xarelto® ? °Take your Xarelto® ONCE DAILY at the same time every day. °You may take it either with or without food. ° °If you have difficulty swallowing the tablet whole, you may crush it and mix in applesauce just prior to taking your dose. ° °Take Xarelto® exactly as prescribed by your doctor and DO NOT stop taking Xarelto® without talking to the doctor who prescribed the medication.  Stopping without other VTE prevention medication to take the place of Xarelto® may increase your risk of developing a clot. ° °After discharge, you should have regular check-up appointments with your healthcare provider that is prescribing your Xarelto®.   ° °What do you do if you miss a dose? °If you miss a dose, take it as soon as you remember on the same day then continue your regularly scheduled once daily regimen the next day. Do not take two doses of Xarelto® on the same day.  ° °Important Safety Information °A possible side effect of Xarelto® is bleeding. You should call your healthcare provider right away if you experience any of the following: °? Bleeding from an injury or your nose that does not stop. °? Unusual colored urine (red or dark brown) or unusual colored stools (red or black). °? Unusual bruising for unknown reasons. °? A serious  fall or if you hit your head (even if there is no bleeding). ° °Some medicines may interact with Xarelto® and might increase your risk of bleeding while on Xarelto®. To help avoid this, consult your healthcare provider or pharmacist prior to using any new prescription or non-prescription medications, including herbals, vitamins, non-steroidal anti-inflammatory drugs (NSAIDs) and supplements. ° °This website has more information on Xarelto®: www.xarelto.com. ° ° °
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
## 2016-09-19 NOTE — Progress Notes (Signed)
Physical Therapy Treatment Patient Details Name: Dalton Murphy MRN: [id number] DOB: [birth year] Today's Date: 09/19/2016    History of Present Illness rt tka    PT Comments    POD # 1 pm session Assisted to bathroom then back to bed amb only 12 feet this afternoon due to increased c/o fatigue.  Pt reports poor sleep past 2 nights.  Assisted back to bed for a few TE's then CPM.  Pt plans to D/C to home tomorrow.   Follow Up Recommendations  Outpatient PT;Supervision/Assistance - 24 hour     Equipment Recommendations  None recommended by PT    Recommendations for Other Services       Precautions / Restrictions Precautions Precautions: Fall;Knee Precaution Comments: Instructed on KI use for stairs, long walks and side sleeping Required Braces or Orthoses: Knee Immobilizer - Right Restrictions Weight Bearing Restrictions: No    Mobility  Bed Mobility               General bed mobility comments: OOB in recliner  Transfers Overall transfer level: Needs assistance Equipment used: Rolling walker (2 wheeled) Transfers: Sit to/from Stand Sit to Stand: From elevated surface;Min guard         General transfer comment: 25% VC's on proper hand placement and LE advancement  Ambulation/Gait Ambulation/Gait assistance: Min guard;Min assist Ambulation Distance (Feet): 58 Feet Assistive device: Rolling walker (2 wheeled) Gait Pattern/deviations: Step-to pattern;Antalgic;Decreased stance time - right Gait velocity: decreased   General Gait Details: 25% VC's on proper walker to self distance and 50% VC's safety with turns   Financial trader Rankin (Stroke Patients Only)       Balance                                            Cognition Arousal/Alertness: Awake/alert Behavior During Therapy: WFL for tasks assessed/performed Overall Cognitive Status: Within Functional Limits for tasks assessed                                         Exercises   Total Knee Replacement TE's 10 reps B LE ankle pumps 10 reps towel squeezes 10 reps knee presses  Followed by ICE     General Comments        Pertinent Vitals/Pain Pain Score: 5  Pain Location: R knee Pain Descriptors / Indicators: Operative site guarding;Tender Pain Intervention(s): Monitored during session;Repositioned;Ice applied    Home Living                      Prior Function            PT Goals (current goals can now be found in the care plan section) Progress towards PT goals: Progressing toward goals    Frequency    7X/week      PT Plan Current plan remains appropriate    Co-evaluation              AM-PAC PT "6 Clicks" Daily Activity  Outcome Measure  Difficulty turning over in bed (including adjusting bedclothes, sheets and blankets)?: A Little Difficulty moving from lying on back to sitting on the side of the bed? : A Little Difficulty  sitting down on and standing up from a chair with arms (e.g., wheelchair, bedside commode, etc,.)?: A Little Help needed moving to and from a bed to chair (including a wheelchair)?: A Little Help needed walking in hospital room?: A Little Help needed climbing 3-5 steps with a railing? : A Lot 6 Click Score: 17    End of Session Equipment Utilized During Treatment: Right knee immobilizer Activity Tolerance: Patient tolerated treatment well Patient left: in chair;with call bell/phone within reach;with family/visitor present Nurse Communication: Mobility status;Patient requests pain meds PT Visit Diagnosis: Unsteadiness on feet (R26.81);Difficulty in walking, not elsewhere classified (R26.2);Pain     Time: 4259-5638 PT Time Calculation (min) (ACUTE ONLY): 24 min  Charges:  $Gait Training: 8-22 mins $Therapeutic Activity: 8-22 mins                    G Codes:       Rica Koyanagi  PTA [facility]  Acute  Rehab Pager      [phone]

## 2016-09-19 NOTE — Progress Notes (Signed)
Physical Therapy Treatment Patient Details Name: Dalton Murphy MRN: [id number] DOB: February 15, 1951 Today's Date: 09/19/2016    History of Present Illness rt tka    PT Comments    POD # 1 am session Applied KI and instructed on use.  Assisted with amb a greater distance in hallway then returned to room to parform some TKR TE's following HEP handout.  Instructed on proper tech and freq as well as use of ICE.    Follow Up Recommendations  Outpatient PT;Supervision/Assistance - 24 hour     Equipment Recommendations  None recommended by PT    Recommendations for Other Services       Precautions / Restrictions Precautions Precautions: Fall;Knee Precaution Comments: Instructed on KI use for stairs, long walks and side sleeping Required Braces or Orthoses: Knee Immobilizer - Right Restrictions Weight Bearing Restrictions: No    Mobility  Bed Mobility               General bed mobility comments: OOB in recliner  Transfers Overall transfer level: Needs assistance Equipment used: Rolling walker (2 wheeled) Transfers: Sit to/from Stand Sit to Stand: From elevated surface;Min guard         General transfer comment: 25% VC's on proper hand placement and LE advancement  Ambulation/Gait Ambulation/Gait assistance: Min guard;Min assist Ambulation Distance (Feet): 58 Feet Assistive device: Rolling walker (2 wheeled) Gait Pattern/deviations: Step-to pattern;Antalgic;Decreased stance time - right Gait velocity: decreased   General Gait Details: 25% VC's on proper walker to self distance and 50% VC's safety with turns   Financial trader Rankin (Stroke Patients Only)       Balance                                            Cognition Arousal/Alertness: Awake/alert Behavior During Therapy: WFL for tasks assessed/performed Overall Cognitive Status: Within Functional Limits for tasks assessed                                        Exercises   Total Knee Replacement TE's 10 reps B LE ankle pumps 10 reps towel squeezes 10 reps knee presses 10 reps heel slides  10 reps SAQ's 10 reps SLR's 10 reps ABD Followed by ICE    General Comments        Pertinent Vitals/Pain Pain Score: 5  Pain Location: R knee Pain Descriptors / Indicators: Operative site guarding;Tender Pain Intervention(s): Monitored during session;Repositioned;Ice applied    Home Living                      Prior Function            PT Goals (current goals can now be found in the care plan section) Progress towards PT goals: Progressing toward goals    Frequency    7X/week      PT Plan Current plan remains appropriate    Co-evaluation              AM-PAC PT "6 Clicks" Daily Activity  Outcome Measure  Difficulty turning over in bed (including adjusting bedclothes, sheets and blankets)?: A Little Difficulty moving from lying on back to sitting on the side  of the bed? : A Little Difficulty sitting down on and standing up from a chair with arms (e.g., wheelchair, bedside commode, etc,.)?: A Little Help needed moving to and from a bed to chair (including a wheelchair)?: A Little Help needed walking in hospital room?: A Little Help needed climbing 3-5 steps with a railing? : A Lot 6 Click Score: 17    End of Session Equipment Utilized During Treatment: Right knee immobilizer Activity Tolerance: Patient tolerated treatment well Patient left: in chair;with call bell/phone within reach;with family/visitor present Nurse Communication: Mobility status;Patient requests pain meds PT Visit Diagnosis: Unsteadiness on feet (R26.81);Difficulty in walking, not elsewhere classified (R26.2);Pain     Time: 0925-1005 PT Time Calculation (min) (ACUTE ONLY): 40 min  Charges:  $Gait Training: 8-22 mins $Therapeutic Exercise: 8-22 mins $Therapeutic Activity: 8-22 mins                    G  Codes:       {Dalton Murphy  PTA [facility]  Acute  Rehab Pager      [phone]

## 2016-09-19 NOTE — Evaluation (Signed)
Occupational Therapy Evaluation Patient Details Name: Dalton Murphy MRN: [id number] DOB: 1951/01/08 Today's Date: 09/19/2016    History of Present Illness rt tka   Clinical Impression   This 66 year old man was admitted for the above sx. He will benefit from one more session of OT to reinforce toilet transfers and educate him on shower transfers. His wife will assist with adls    Follow Up Recommendations  Supervision/Assistance - 24 hour    Equipment Recommendations  3 in 1 bedside commode    Recommendations for Other Services       Precautions / Restrictions Precautions Precautions: Fall;Knee Required Braces or Orthoses: Knee Immobilizer - Right Restrictions Weight Bearing Restrictions: No      Mobility Bed Mobility         Supine to sit: Min assist     General bed mobility comments: HOB raised and assist for RLE  Transfers Overall transfer level: Needs assistance Equipment used: Rolling walker (2 wheeled)   Sit to Stand: From elevated surface         General transfer comment: cues for UE/LE placement.  assist to rise and steady    Balance                                           ADL either performed or assessed with clinical judgement   ADL Overall ADL's : Needs assistance/impaired     Grooming: Min guard;Standing   Upper Body Bathing: Set up;Sitting   Lower Body Bathing: Moderate assistance;Sit to/from stand   Upper Body Dressing : Set up;Sitting   Lower Body Dressing: Maximal assistance;Sit to/from stand   Toilet Transfer: Minimal assistance;Ambulation;RW;Comfort height toilet   Toileting- Clothing Manipulation and Hygiene: Minimal assistance;Sit to/from stand         General ADL Comments: pt has a comfort height commode at home without anything to push up from. He will need 3:1:  his other knee is "bad" and he has back pain     Vision         Perception     Praxis      Pertinent Vitals/Pain Pain  Assessment: Faces Faces Pain Scale: Hurts little more Pain Location: R knee Pain Descriptors / Indicators: Aching Pain Intervention(s): Limited activity within patient's tolerance;Monitored during session;Premedicated before session;Repositioned;Ice applied     Hand Dominance     Extremity/Trunk Assessment Upper Extremity Assessment Upper Extremity Assessment: Overall WFL for tasks assessed           Communication Communication Communication: No difficulties (very talkative)   Cognition Arousal/Alertness: Awake/alert Behavior During Therapy: WFL for tasks assessed/performed Overall Cognitive Status: Within Functional Limits for tasks assessed                                 General Comments: redirection due to talking a lot   General Comments       Exercises     Shoulder Instructions      Home Living Family/patient expects to be discharged to:: Private residence Living Arrangements: Spouse/significant other Available Help at Discharge: Family               Bathroom Shower/Tub: Walk-in shower   Bathroom Toilet: Handicapped height     Home Equipment: Environmental consultant - 2 wheels;Shower seat - built in  Prior Functioning/Environment          Comments: works at [employer]        OT Problem List: Decreased strength;Decreased activity tolerance;Pain;Decreased knowledge of use of DME or AE;Decreased knowledge of precautions      OT Treatment/Interventions: Self-care/ADL training;DME and/or AE instruction;Patient/family education    OT Goals(Current goals can be found in the care plan section) Acute Rehab OT Goals Patient Stated Goal: walk OT Goal Formulation: With patient Time For Goal Achievement: 09/26/16 Potential to Achieve Goals: Good ADL Goals Pt Will Transfer to Toilet: with supervision;bedside commode;ambulating Pt Will Perform Tub/Shower Transfer: Shower transfer;ambulating;with min guard assist;shower seat  OT Frequency: Min  2X/week   Barriers to D/C:            Co-evaluation              AM-PAC PT "6 Clicks" Daily Activity     Outcome Measure Help from another person eating meals?: None Help from another person taking care of personal grooming?: A Little Help from another person toileting, which includes using toliet, bedpan, or urinal?: A Little Help from another person bathing (including washing, rinsing, drying)?: A Lot Help from another person to put on and taking off regular upper body clothing?: A Little Help from another person to put on and taking off regular lower body clothing?: A Lot 6 Click Score: 17   End of Session CPM Right Knee CPM Right Knee: Off  Activity Tolerance: Patient tolerated treatment well Patient left: in chair;with call bell/phone within reach  OT Visit Diagnosis: Pain Pain - Right/Left: Right Pain - part of body: Knee                Time: 6553-7482 OT Time Calculation (min): 29 min Charges:  OT General Charges $OT Visit: 1 Procedure OT Evaluation $OT Eval Low Complexity: 1 Procedure OT Treatments $Self Care/Home Management : 8-22 mins G-Codes:     Dougherty, OTR/L [phone] 09/19/2016  Donye Dauenhauer 09/19/2016, 10:13 AM
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
## 2016-09-19 NOTE — Progress Notes (Signed)
Discharge planning, no HH needs identified. Plan for OP PT at d/c, requesting 3n1, has RW. Contacted [facility] to deliver to room. [phone]

## 2016-09-20 LAB — BASIC METABOLIC PANEL
Anion gap: 9 (ref 5–15)
BUN: 17 mg/dL (ref 6–20)
CALCIUM: 8.8 mg/dL — AB (ref 8.9–10.3)
CO2: 27 mmol/L (ref 22–32)
CREATININE: 0.64 mg/dL (ref 0.61–1.24)
Chloride: 101 mmol/L (ref 101–111)
Glucose, Bld: 126 mg/dL — ABNORMAL HIGH (ref 65–99)
Potassium: 3.7 mmol/L (ref 3.5–5.1)
SODIUM: 137 mmol/L (ref 135–145)

## 2016-09-20 LAB — CBC
HCT: 35.6 % — ABNORMAL LOW (ref 39.0–52.0)
Hemoglobin: 12.2 g/dL — ABNORMAL LOW (ref 13.0–17.0)
MCH: 29.8 pg (ref 26.0–34.0)
MCHC: 34.3 g/dL (ref 30.0–36.0)
MCV: 86.8 fL (ref 78.0–100.0)
PLATELETS: 239 10*3/uL (ref 150–400)
RBC: 4.1 MIL/uL — ABNORMAL LOW (ref 4.22–5.81)
RDW: 13.1 % (ref 11.5–15.5)
WBC: 12.7 10*3/uL — ABNORMAL HIGH (ref 4.0–10.5)

## 2016-09-20 NOTE — Progress Notes (Signed)
Pt. placed on CPAP for h/s, replaced pts. CPAP nasal mask with hospitals M/L due to pts. clip breaking, humidity filled, currently on room air, RT to monitor.

## 2016-09-20 NOTE — Progress Notes (Signed)
Occupational Therapy Treatment Patient Details Name: Dalton Murphy MRN: [id number] DOB: Sep 22, 1950 Today's Date: 09/20/2016    History of present illness rt tka   OT comments  All education completed this session.  Pt had pillow long ways under his RLE when I arrived. Reinforced that he cannot do this.  Pt verbalizes understanding  Follow Up Recommendations  Supervision/Assistance - 24 hour    Equipment Recommendations  3 in 1 bedside commode (delivered)    Recommendations for Other Services      Precautions / Restrictions Precautions Precautions: Fall;Knee Precaution Comments: Instructed on KI use for stairs, long walks and side sleeping.  Reinforced no pillow under the knee.  Used KI for shower transfer Required Braces or Orthoses: Knee Immobilizer - Right Restrictions Weight Bearing Restrictions: No       Mobility Bed Mobility         Supine to sit: Min assist     General bed mobility comments: support for RLE  Transfers   Equipment used: Rolling walker (2 wheeled)   Sit to Stand: Supervision;From elevated surface         General transfer comment: one cue for hand placement    Balance                                           ADL either performed or assessed with clinical judgement   ADL                                   Tub/ Shower Transfer: Walk-in shower;Min guard;Ambulation;Shower seat     General ADL Comments: practiced stepping into shower and provided handout.  Pt reports he is comfortable getting to commode.  Supervision ambulating back to chair     Vision       Perception     Praxis      Cognition Arousal/Alertness: Awake/alert Behavior During Therapy: WFL for tasks assessed/performed Overall Cognitive Status: Within Functional Limits for tasks assessed                                          Exercises     Shoulder Instructions       General Comments      Pertinent  Vitals/ Pain       Pain Score: 7  Pain Location: R knee Pain Descriptors / Indicators: Aching Pain Intervention(s): Limited activity within patient's tolerance;Monitored during session;Premedicated before session;Repositioned;Ice applied  Home Living                                          Prior Functioning/Environment              Frequency           Progress Toward Goals  OT Goals(current goals can now be found in the care plan section)  Progress towards OT goals: Progressing toward goals  Acute Rehab OT Goals Patient Stated Goal: walk  Plan      Co-evaluation                 AM-PAC PT "6 Clicks" Daily Activity  Outcome Measure   Help from another person eating meals?: None Help from another person taking care of personal grooming?: A Little Help from another person toileting, which includes using toliet, bedpan, or urinal?: A Little Help from another person bathing (including washing, rinsing, drying)?: A Lot Help from another person to put on and taking off regular upper body clothing?: A Little Help from another person to put on and taking off regular lower body clothing?: A Lot 6 Click Score: 17    End of Session    OT Visit Diagnosis: Pain Pain - Right/Left: Right Pain - part of body: Knee   Activity Tolerance Patient tolerated treatment well   Patient Left in chair;with call bell/phone within reach   Nurse Communication          Time: 9675-9163 OT Time Calculation (min): 13 min  Charges: OT General Charges $OT Visit: 1 Procedure OT Treatments $Self Care/Home Management : 8-22 mins  Lesle Chris, OTR/L [phone] 09/20/2016   [place] 09/20/2016, 8:13 AM

## 2016-09-20 NOTE — Progress Notes (Signed)
Physical Therapy Treatment Patient Details Name: Dalton Murphy MRN: [id number] DOB: 17-Jun-1950 Today's Date: 09/20/2016    History of Present Illness rt tka    PT Comments    POD # 2 am session Pt reports increased pain and fatigue today.  Pt just got back to recliner from bathroom and too tiered to amb so performed TKR TE's followed by ICE.  Pt will need another PT session to address stairs with spouse.   Follow Up Recommendations     OP   Equipment Recommendations       Recommendations for Other Services       Precautions / Restrictions Precautions Precautions: Fall;Knee Precaution Comments: Instructed on KI use for stairs, long walks and side sleeping.  Reinforced no pillow under the knee Required Braces or Orthoses: Knee Immobilizer - Right Restrictions Weight Bearing Restrictions: No       Balance                                            Cognition Arousal/Alertness: Awake/alert Behavior During Therapy: WFL for tasks assessed/performed Overall Cognitive Status: Within Functional Limits for tasks assessed                                        Exercises   Total Knee Replacement TE's 10 reps B LE ankle pumps 10 reps towel squeezes 10 reps knee presses 10 reps heel slides  10 reps SAQ's 10 reps SLR's 10 reps ABD Followed by ICE     General Comments        Pertinent Vitals/Pain Pain Assessment: 0-10 Pain Score: 7  Pain Location: R knee Pain Descriptors / Indicators: Operative site guarding;Grimacing Pain Intervention(s): Monitored during session;Repositioned;Ice applied    Home Living                      Prior Function            PT Goals (current goals can now be found in the care plan section) Progress towards PT goals: Progressing toward goals    Frequency           PT Plan      Co-evaluation              AM-PAC PT "6 Clicks" Daily Activity  Outcome Measure                   End of Session Equipment Utilized During Treatment: Right knee immobilizer Activity Tolerance: Patient tolerated treatment well Patient left: in chair;with call bell/phone within reach;with family/visitor present Nurse Communication: Mobility status;Patient requests pain meds       Time: 4827-0786 PT Time Calculation (min) (ACUTE ONLY): 14 min  Charges:  $Therapeutic Exercise: 8-22 mins                    G Codes:       Rica Koyanagi  PTA [facility]  Acute  Rehab Pager      [phone]
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
## 2016-09-20 NOTE — Progress Notes (Signed)
Physical Therapy Treatment Patient Details Name: Dalton Murphy MRN: [id number] DOB: [birth year] Today's Date: 09/20/2016    History of Present Illness rt tka    PT Comments    POD # 2 pm session Spouse present for "hands on" instruction on safe handling pt esp with stairs.  Advised to wear KI for increased support.  Practiced 3 steps one rail forward twice.  Progressing well and ready for D/C to home.   Follow Up Recommendations  Outpatient PT;Supervision/Assistance - 24 hour     Equipment Recommendations       Recommendations for Other Services       Precautions / Restrictions Precautions Precautions: Fall;Knee Precaution Comments: Instructed on KI use for stairs, long walks and side sleeping.  Reinforced no pillow under the knee Required Braces or Orthoses: Knee Immobilizer - Right Restrictions Weight Bearing Restrictions: No    Mobility  Bed Mobility               General bed mobility comments: OOB in recliner  Transfers Overall transfer level: Needs assistance Equipment used: Rolling walker (2 wheeled) Transfers: Sit to/from Stand Sit to Stand: Supervision;From elevated surface         General transfer comment: one cue for hand placement  Ambulation/Gait Ambulation/Gait assistance: Supervision;Min guard Ambulation Distance (Feet): 84 Feet Assistive device: Rolling walker (2 wheeled) Gait Pattern/deviations: Step-to pattern;Antalgic;Decreased stance time - right Gait velocity: decreased   General Gait Details: 25% VC's on proper walker to self distance and 50% VC's safety with turns   Stairs Stairs: Yes   Stair Management: One rail Left;Step to pattern;Forwards;With walker Number of Stairs: 3 General stair comments: with spouse present for "hands on" instruction on safe handling.  Also instructed pt to wear KI for increased support.   Wheelchair Mobility    Modified Rankin (Stroke Patients Only)          Cognition Arousal/Alertness:  Awake/alert Behavior During Therapy: WFL for tasks assessed/performed Overall Cognitive Status: Within Functional Limits for tasks assessed                                               Pertinent Vitals/Pain Pain Assessment: 0-10 Pain Score: 7  Pain Location: R knee Pain Descriptors / Indicators: Operative site guarding;Grimacing Pain Intervention(s): Monitored during session;Repositioned;Ice applied           PT Goals (current goals can now be found in the care plan section) Progress towards PT goals: Progressing toward goals    Frequency           PT Plan Current plan remains appropriate    Co-evaluation              AM-PAC PT "6 Clicks" Daily Activity  Outcome Measure  Difficulty turning over in bed (including adjusting bedclothes, sheets and blankets)?: A Little Difficulty moving from lying on back to sitting on the side of the bed? : A Little Difficulty sitting down on and standing up from a chair with arms (e.g., wheelchair, bedside commode, etc,.)?: A Little Help needed moving to and from a bed to chair (including a wheelchair)?: A Little Help needed walking in hospital room?: A Little Help needed climbing 3-5 steps with a railing? : A Lot 6 Click Score: 17    End of Session Equipment Utilized During Treatment: Right knee immobilizer Activity Tolerance: Patient tolerated treatment well  Patient left: in chair;with call bell/phone within reach;with family/visitor present Nurse Communication:  (pt ready for D/C to home) PT Visit Diagnosis: Unsteadiness on feet (R26.81);Difficulty in walking, not elsewhere classified (R26.2);Pain     Time: 6701-4103 PT Time Calculation (min) (ACUTE ONLY): 25 min  Charges:  $Gait Training: 8-22 mins $Therapeutic Activity: 8-22 mins                    G Codes:      Rica Koyanagi  PTA [facility]  Acute  Rehab Pager      [phone]

## 2016-09-22 DIAGNOSIS — M25561 Pain in right knee: Secondary | ICD-10-CM | POA: Diagnosis not present

## 2016-09-25 DIAGNOSIS — M25561 Pain in right knee: Secondary | ICD-10-CM | POA: Diagnosis not present

## 2016-09-27 DIAGNOSIS — M25561 Pain in right knee: Secondary | ICD-10-CM | POA: Diagnosis not present

## 2016-09-29 DIAGNOSIS — M25561 Pain in right knee: Secondary | ICD-10-CM | POA: Diagnosis not present

## 2016-10-02 DIAGNOSIS — M25561 Pain in right knee: Secondary | ICD-10-CM | POA: Diagnosis not present

## 2016-10-03 DIAGNOSIS — M1711 Unilateral primary osteoarthritis, right knee: Secondary | ICD-10-CM | POA: Diagnosis not present

## 2016-10-03 DIAGNOSIS — Z96651 Presence of right artificial knee joint: Secondary | ICD-10-CM | POA: Diagnosis not present

## 2016-10-03 DIAGNOSIS — Z471 Aftercare following joint replacement surgery: Secondary | ICD-10-CM | POA: Diagnosis not present

## 2016-10-04 DIAGNOSIS — M25561 Pain in right knee: Secondary | ICD-10-CM | POA: Diagnosis not present

## 2016-10-06 DIAGNOSIS — M25561 Pain in right knee: Secondary | ICD-10-CM | POA: Diagnosis not present

## 2016-10-09 DIAGNOSIS — M25561 Pain in right knee: Secondary | ICD-10-CM | POA: Diagnosis not present

## 2016-10-11 DIAGNOSIS — M25561 Pain in right knee: Secondary | ICD-10-CM | POA: Diagnosis not present

## 2016-10-13 DIAGNOSIS — M25561 Pain in right knee: Secondary | ICD-10-CM | POA: Diagnosis not present

## 2016-10-16 DIAGNOSIS — M25561 Pain in right knee: Secondary | ICD-10-CM | POA: Diagnosis not present

## 2016-10-20 DIAGNOSIS — M25561 Pain in right knee: Secondary | ICD-10-CM | POA: Diagnosis not present

## 2016-10-23 DIAGNOSIS — M25561 Pain in right knee: Secondary | ICD-10-CM | POA: Diagnosis not present

## 2016-10-25 DIAGNOSIS — M25561 Pain in right knee: Secondary | ICD-10-CM | POA: Diagnosis not present

## 2016-10-26 DIAGNOSIS — Z96651 Presence of right artificial knee joint: Secondary | ICD-10-CM | POA: Diagnosis not present

## 2016-10-26 DIAGNOSIS — M1711 Unilateral primary osteoarthritis, right knee: Secondary | ICD-10-CM | POA: Diagnosis not present

## 2016-10-26 DIAGNOSIS — Z471 Aftercare following joint replacement surgery: Secondary | ICD-10-CM | POA: Diagnosis not present

## 2016-10-27 DIAGNOSIS — M25561 Pain in right knee: Secondary | ICD-10-CM | POA: Diagnosis not present

## 2016-10-31 DIAGNOSIS — M25561 Pain in right knee: Secondary | ICD-10-CM | POA: Diagnosis not present

## 2016-11-02 DIAGNOSIS — M25561 Pain in right knee: Secondary | ICD-10-CM | POA: Diagnosis not present

## 2016-11-06 DIAGNOSIS — M25561 Pain in right knee: Secondary | ICD-10-CM | POA: Diagnosis not present

## 2016-11-08 DIAGNOSIS — M25561 Pain in right knee: Secondary | ICD-10-CM | POA: Diagnosis not present

## 2016-11-14 DIAGNOSIS — M25561 Pain in right knee: Secondary | ICD-10-CM | POA: Diagnosis not present

## 2016-11-16 ENCOUNTER — Other Ambulatory Visit: Payer: Self-pay | Admitting: *Deleted

## 2016-11-16 DIAGNOSIS — M25561 Pain in right knee: Secondary | ICD-10-CM | POA: Diagnosis not present

## 2016-11-16 DIAGNOSIS — G8929 Other chronic pain: Secondary | ICD-10-CM

## 2016-11-16 DIAGNOSIS — M79671 Pain in right foot: Principal | ICD-10-CM

## 2016-11-16 MED ORDER — TRAMADOL HCL 50 MG PO TABS: 50.0000 mg | ORAL_TABLET | Freq: Three times a day (TID) | ORAL | 0 refills | Status: DC | PRN

## 2016-11-20 DIAGNOSIS — M25561 Pain in right knee: Secondary | ICD-10-CM | POA: Diagnosis not present

## 2016-11-22 DIAGNOSIS — M25561 Pain in right knee: Secondary | ICD-10-CM | POA: Diagnosis not present

## 2016-11-23 DIAGNOSIS — M1711 Unilateral primary osteoarthritis, right knee: Secondary | ICD-10-CM | POA: Diagnosis not present

## 2016-11-27 DIAGNOSIS — M25561 Pain in right knee: Secondary | ICD-10-CM | POA: Diagnosis not present

## 2016-11-30 ENCOUNTER — Ambulatory Visit (INDEPENDENT_AMBULATORY_CARE_PROVIDER_SITE_OTHER): Payer: Medicare Other | Admitting: Sports Medicine

## 2016-11-30 DIAGNOSIS — Z96651 Presence of right artificial knee joint: Secondary | ICD-10-CM | POA: Diagnosis not present

## 2016-11-30 DIAGNOSIS — M109 Gout, unspecified: Secondary | ICD-10-CM

## 2016-11-30 DIAGNOSIS — G8929 Other chronic pain: Secondary | ICD-10-CM | POA: Diagnosis not present

## 2016-11-30 DIAGNOSIS — M79671 Pain in right foot: Secondary | ICD-10-CM | POA: Diagnosis present

## 2016-11-30 MED ORDER — TRAMADOL HCL 50 MG PO TABS: 50.0000 mg | ORAL_TABLET | Freq: Four times a day (QID) | ORAL | 3 refills | Status: DC

## 2016-11-30 NOTE — Assessment & Plan Note (Addendum)
Suspect right foot pain likely secondary to current gout flare.   -Continue Diclofenac -- instructed patient this may be taken up to 3x daily.   -Tramadol also may be used for breakthrough pain.  Patient already taking Tramadol for knee pain s/p TKA which should concurrently help with foot pain.

## 2016-11-30 NOTE — Assessment & Plan Note (Signed)
Recent surgery 09/18/2016 by Dr. Wynelle Link.  Patient experiencing some knee swelling and restriction of motion.  -Ultrasound R knee performed to visualize knee joint in office -- approx 7 cm effusion noted along suprapatellar pouch, 2 cm along lateral joint line and 1 cm along medial joint line.  Patellar tendon also noted to appear thickened.   -Compression sleeve provided to help with joint swelling.   -Continue to use ice as needed to reduce swelling.  Anticipate pain will improve as swelling subsides.   -Gentle stationary biking at the gym  -Reasonable to increase Tramadol to QID given he will be spending a great deal of time on his feet once resuming work.  -Follow up: 2 months

## 2016-11-30 NOTE — Progress Notes (Signed)
CC: f/u R knee replacement, R foot pain   HPI: 66 yo male presenting s/p R TKA on 09/18/2016 by Dr. Wynelle Link.  He denies much discomfort when sitting currently however upon standing and ambulating he reports pain as 5/10 in severity.  He has been working with PT and his ROM with flexion is not quite where it should be at this stage however he is doing exercises on his own as well to help achieve this.   He notes he feels some stiffness in the knee when he attempts flexion.  Has noted marked swelling in comparison to left knee.  He works at [employer] and is on his feet a great deal.  He will be starting work again soon as half-time for 2 weeks and will then resume full time with 8 hour days.  He anticipates this will be hard on his new knee replacement and would like to increase his Tramadol from TID to QID if possible.   He has joined a gym and plans to continue working on increasing strength and ROM as he recovers.    Of note, he also has been having a gout flare of his R foot, notably between great toe and 2nd MTP joint.  He does not note tenderness over the joint however weightbearing and tiptoeing cause a sharp pain.  Worse with standing.  He has noted swelling in the foot as well as some numbness and tingling towards the tips of his toes.  He does not have pain with toe movement.   ROS: Swelling present.  No numbness or tingling.    Physical exam: 66 yo male, NAD BP 136/70   Ht 5\' 11"  (1.803 m)   Wt 235 lb (106.6 kg)   BMI 32.78 kg/m   R Knee: Normal to inspection with no erythema.  Mild swelling present over knee joint with midline clean and well-healing midline incision.  Palpation normal with no warmth or joint line tenderness or patellar tenderness or condyle tenderness. ROM normal in extension and lower leg rotation.  Limited flexion to 115 degrees.  Patellar and quadriceps tendons unremarkable.  Hamstring and quadriceps strength is normal.  R foot: No visible erythema or warmth noted.   Mild swelling present primarily over bases of 1st and 2nd MTP joint. Full ROM of right great toe.  Strength is 5/5 in all directions.  Sensation intact.  Neuro: alert, oriented x3   A/P:   s/p right TKA  Recent surgery 09/18/2016 by Dr. Wynelle Link.  Patient experiencing some knee swelling and restriction of motion.  -Ultrasound R knee performed to visualize knee joint in office -- approx 7 cm effusion noted along suprapatellar pouch, 2 cm along lateral joint line and 1 cm along medial joint line.  Patellar tendon also noted to appear thickened.   -Compression sleeve provided to help with joint swelling.   -Continue to use ice as needed to reduce swelling.  Anticipate pain will improve as swelling subsides.   -Gentle stationary biking at the gym  -Reasonable to increase Tramadol to QID given he will be spending a great deal of time on his feet once resuming work.  -Follow up: 2 months    Gout  Suspect right foot pain likely secondary to current gout flare.   -Continue Diclofenac -- instructed patient this may be taken up to 3x daily.   -Tramadol also may be used for breakthrough pain.  Patient already taking Tramadol for knee pain s/p TKA which should concurrently help with foot  pain.    Lovenia Kim, MD PGY-2 [facility] Sports Medicine   I observed and examined the patient with the resident and agree with assessment and plan.  Note reviewed and modified by me. Stefanie Libel, MD

## 2016-12-12 ENCOUNTER — Other Ambulatory Visit: Payer: Self-pay | Admitting: *Deleted

## 2016-12-12 DIAGNOSIS — M79671 Pain in right foot: Principal | ICD-10-CM

## 2016-12-12 DIAGNOSIS — G8929 Other chronic pain: Secondary | ICD-10-CM

## 2016-12-12 MED ORDER — TRAMADOL HCL 50 MG PO TABS: 50.0000 mg | ORAL_TABLET | Freq: Four times a day (QID) | ORAL | 3 refills | Status: DC

## 2016-12-19 DIAGNOSIS — M1711 Unilateral primary osteoarthritis, right knee: Secondary | ICD-10-CM | POA: Diagnosis not present

## 2017-01-07 ENCOUNTER — Other Ambulatory Visit: Payer: Self-pay | Admitting: Sports Medicine

## 2017-04-01 ENCOUNTER — Other Ambulatory Visit: Payer: Self-pay | Admitting: Sports Medicine

## 2017-04-01 DIAGNOSIS — M79671 Pain in right foot: Principal | ICD-10-CM

## 2017-04-01 DIAGNOSIS — G8929 Other chronic pain: Secondary | ICD-10-CM

## 2017-04-02 ENCOUNTER — Other Ambulatory Visit: Payer: Self-pay | Admitting: *Deleted

## 2017-04-02 DIAGNOSIS — G8929 Other chronic pain: Secondary | ICD-10-CM

## 2017-04-02 DIAGNOSIS — M79671 Pain in right foot: Principal | ICD-10-CM

## 2017-04-02 MED ORDER — TRAMADOL HCL 50 MG PO TABS: ORAL_TABLET | ORAL | 3 refills | Status: DC

## 2017-04-17 ENCOUNTER — Other Ambulatory Visit: Payer: Self-pay | Admitting: Sports Medicine

## 2017-07-16 IMAGING — DX DG CHEST 2V
2 series · 2 of 2 positions shown · non-contrast
Comparison: None.

CLINICAL DATA: Cough, congestion, shortness of breath for 5 days,
low-grade fever, former smoking history

EXAM:
CHEST  2 VIEW

[chest pa]
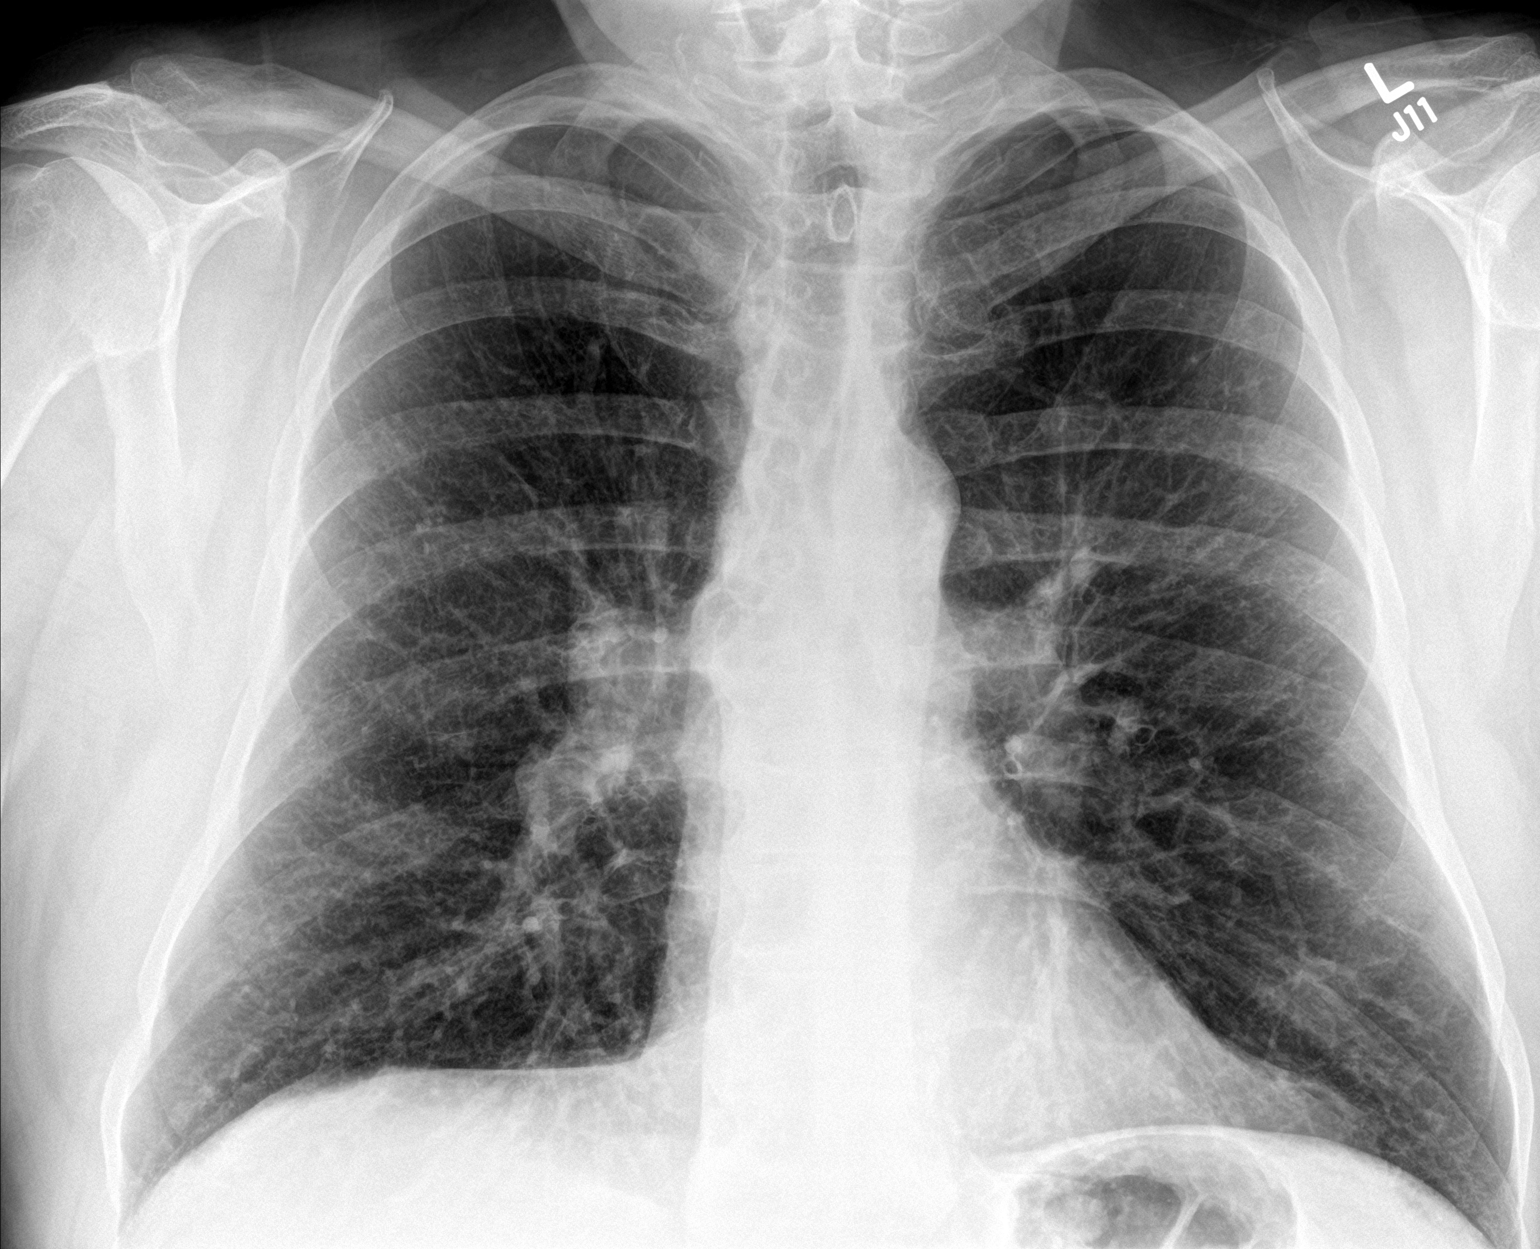

[chest lat]
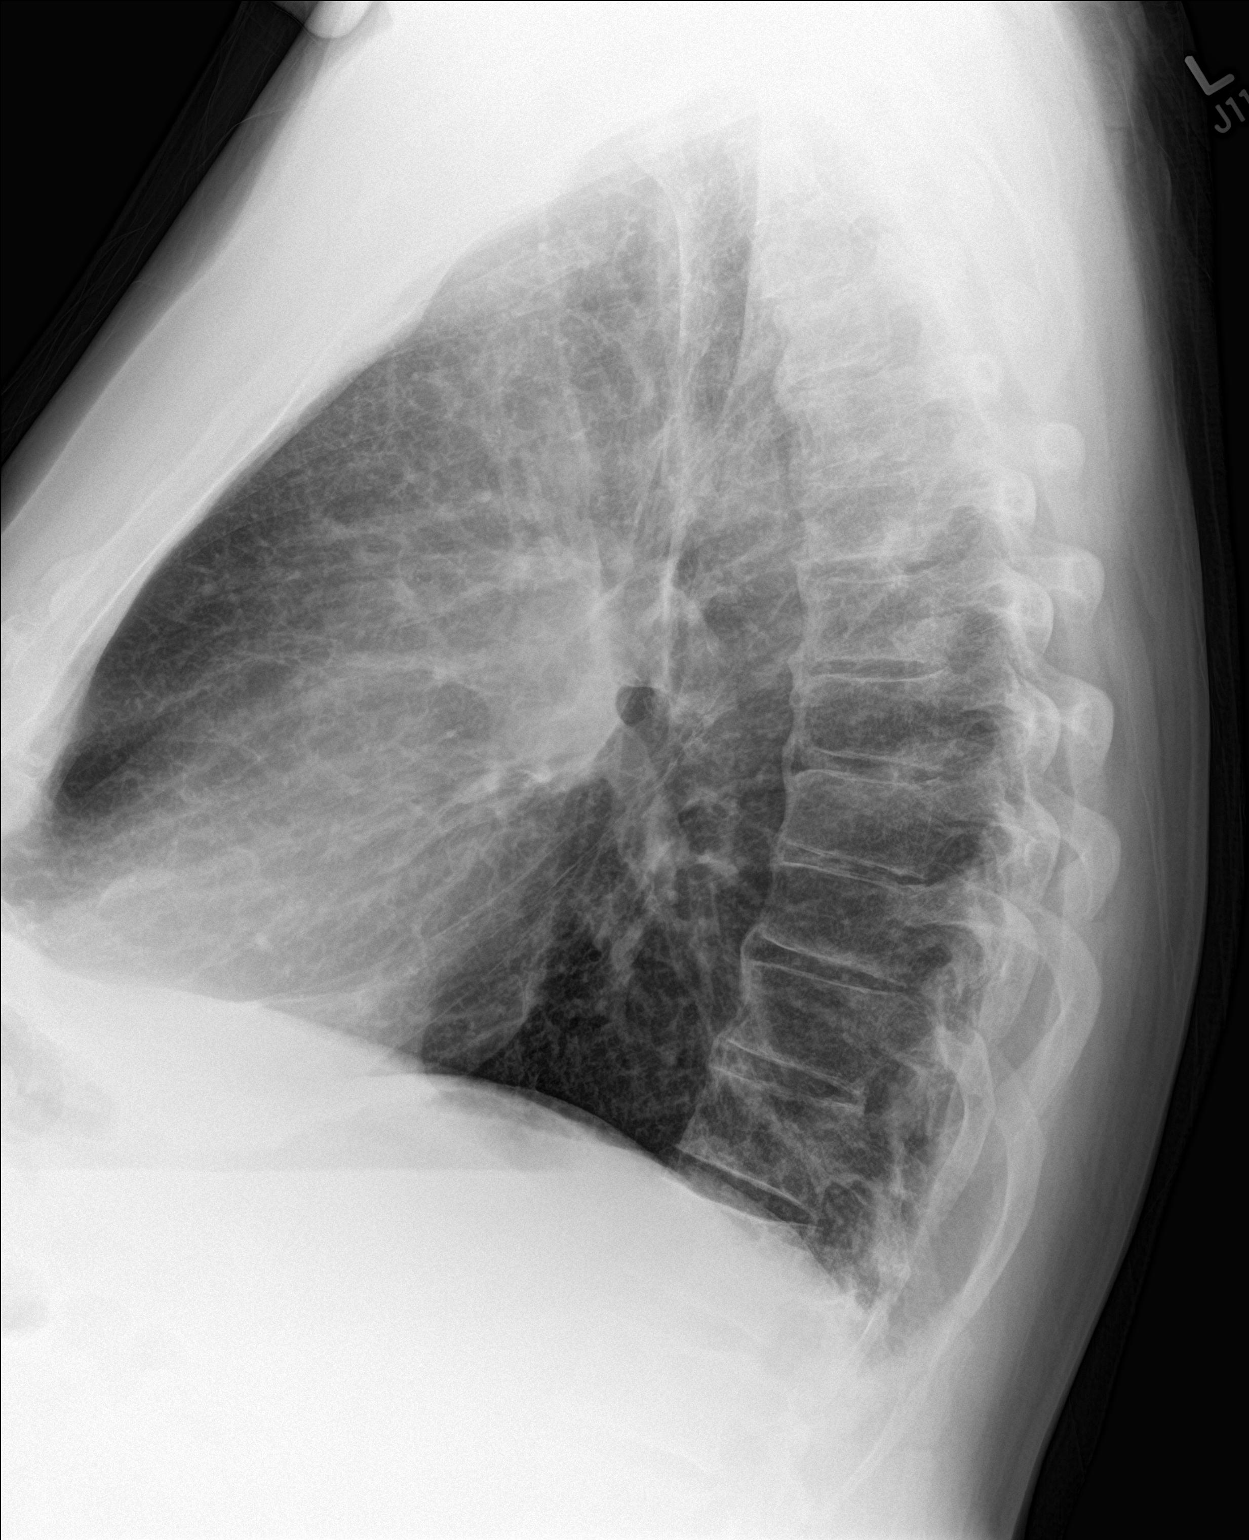

[2 of 2 positions shown; findings below may reference images not displayed]

FINDINGS: No active infiltrate or effusion is seen. The lungs are slightly
hyperaerated which may indicate a degree of emphysema. Also the
pulmonary artery segments appear somewhat prominent which may
indicate a degree of pulmonary arterial hypertension. No definite
mediastinal or hilar adenopathy is seen. The heart is within normal
limits in size. Mild degenerative change is noted throughout the
thoracic spine.
IMPRESSION: No active lung disease. Question emphysema and possibly a degree of
pulmonary arterial hypertension.

## 2017-07-23 ENCOUNTER — Other Ambulatory Visit: Payer: Self-pay | Admitting: Internal Medicine

## 2017-07-23 ENCOUNTER — Other Ambulatory Visit: Payer: Self-pay | Admitting: Sports Medicine

## 2017-07-23 DIAGNOSIS — M79671 Pain in right foot: Principal | ICD-10-CM

## 2017-07-23 DIAGNOSIS — G8929 Other chronic pain: Secondary | ICD-10-CM

## 2017-09-02 ENCOUNTER — Telehealth: Payer: Self-pay | Admitting: Internal Medicine

## 2017-09-03 ENCOUNTER — Other Ambulatory Visit: Payer: Medicare Other

## 2017-09-03 MED ORDER — LEVOTHYROXINE SODIUM 150 MCG PO TABS: 150.0000 ug | ORAL_TABLET | Freq: Every day | ORAL | 0 refills | Status: DC

## 2017-09-03 NOTE — Telephone Encounter (Signed)
Patient has scheduled for 5/29.  [name] enough medication to be sent in to his pharmacy to get him through to this date.

## 2017-09-03 NOTE — Addendum Note (Signed)
Addended by: Terence Lux B on: 09/03/2017 11:45 AM   Modules accepted: Orders
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
## 2017-09-12 ENCOUNTER — Ambulatory Visit (INDEPENDENT_AMBULATORY_CARE_PROVIDER_SITE_OTHER): Payer: Medicare Other | Admitting: Internal Medicine

## 2017-09-12 ENCOUNTER — Other Ambulatory Visit (INDEPENDENT_AMBULATORY_CARE_PROVIDER_SITE_OTHER): Payer: Medicare Other

## 2017-09-12 ENCOUNTER — Encounter: Payer: Self-pay | Admitting: Internal Medicine

## 2017-09-12 VITALS — BP 120/66 | HR 74 | Temp 98.1°F | Resp 16 | Wt 270.0 lb

## 2017-09-12 DIAGNOSIS — M109 Gout, unspecified: Secondary | ICD-10-CM

## 2017-09-12 DIAGNOSIS — R7303 Prediabetes: Secondary | ICD-10-CM

## 2017-09-12 DIAGNOSIS — M170 Bilateral primary osteoarthritis of knee: Secondary | ICD-10-CM | POA: Diagnosis not present

## 2017-09-12 DIAGNOSIS — E038 Other specified hypothyroidism: Secondary | ICD-10-CM | POA: Diagnosis not present

## 2017-09-12 DIAGNOSIS — E781 Pure hyperglyceridemia: Secondary | ICD-10-CM

## 2017-09-12 DIAGNOSIS — Z125 Encounter for screening for malignant neoplasm of prostate: Secondary | ICD-10-CM | POA: Diagnosis not present

## 2017-09-12 LAB — CBC WITH DIFFERENTIAL/PLATELET
BASOS ABS: 0 10*3/uL (ref 0.0–0.1)
Basophils Relative: 0.5 % (ref 0.0–3.0)
EOS PCT: 1.8 % (ref 0.0–5.0)
Eosinophils Absolute: 0.1 10*3/uL (ref 0.0–0.7)
HCT: 41.5 % (ref 39.0–52.0)
HEMOGLOBIN: 14.2 g/dL (ref 13.0–17.0)
Lymphocytes Relative: 30.3 % (ref 12.0–46.0)
Lymphs Abs: 1.9 10*3/uL (ref 0.7–4.0)
MCHC: 34.1 g/dL (ref 30.0–36.0)
MCV: 88.9 fl (ref 78.0–100.0)
MONOS PCT: 8 % (ref 3.0–12.0)
Monocytes Absolute: 0.5 10*3/uL (ref 0.1–1.0)
Neutro Abs: 3.7 10*3/uL (ref 1.4–7.7)
Neutrophils Relative %: 59.4 % (ref 43.0–77.0)
Platelets: 216 10*3/uL (ref 150.0–400.0)
RBC: 4.67 Mil/uL (ref 4.22–5.81)
RDW: 13.7 % (ref 11.5–15.5)
WBC: 6.3 10*3/uL (ref 4.0–10.5)

## 2017-09-12 LAB — LIPID PANEL
CHOLESTEROL: 210 mg/dL — AB (ref 0–200)
HDL: 40.6 mg/dL (ref >39.00)
NonHDL: 169.39
TRIGLYCERIDES: 292 mg/dL — AB (ref 0.0–149.0)
Total CHOL/HDL Ratio: 5
VLDL: 58.4 mg/dL — ABNORMAL HIGH (ref 0.0–40.0)

## 2017-09-12 LAB — COMPREHENSIVE METABOLIC PANEL
ALBUMIN: 4.6 g/dL (ref 3.5–5.2)
ALK PHOS: 66 U/L (ref 39–117)
ALT: 61 U/L — AB (ref 0–53)
AST: 35 U/L (ref 0–37)
BILIRUBIN TOTAL: 1.4 mg/dL — AB (ref 0.2–1.2)
BUN: 20 mg/dL (ref 6–23)
CALCIUM: 9.1 mg/dL (ref 8.4–10.5)
CO2: 28 mEq/L (ref 19–32)
Chloride: 106 mEq/L (ref 96–112)
Creatinine, Ser: 0.99 mg/dL (ref 0.40–1.50)
GFR: 80.14 mL/min (ref >60.00)
GLUCOSE: 97 mg/dL (ref 70–99)
POTASSIUM: 4.9 meq/L (ref 3.5–5.1)
Sodium: 142 mEq/L (ref 135–145)
Total Protein: 7.4 g/dL (ref 6.0–8.3)

## 2017-09-12 LAB — URIC ACID: Uric Acid, Serum: 7.4 mg/dL (ref 4.0–7.8)

## 2017-09-12 LAB — PSA, MEDICARE: PSA: 2.05 ng/ml (ref 0.10–4.00)

## 2017-09-12 LAB — LDL CHOLESTEROL, DIRECT: LDL DIRECT: 124 mg/dL

## 2017-09-12 LAB — HEMOGLOBIN A1C: HEMOGLOBIN A1C: 5.8 % (ref 4.6–6.5)

## 2017-09-12 NOTE — Assessment & Plan Note (Addendum)
Check lipid panel  Regular exercise and healthy diet encouraged Needs to work on weight loss

## 2017-09-12 NOTE — Patient Instructions (Addendum)
  Test(s) ordered today. Your results will be released to [place] (or called to you) after review, usually within 72hours after test completion. If any changes need to be made, you will be notified at that same time.   No immunizations administered today.   Medications reviewed and updated.   No changes recommended at this time.  Your prescription(s) have been submitted to your pharmacy. Please take as directed and contact our office if you believe you are having problem(s) with the medication(s).    Please followup in 12 months

## 2017-09-12 NOTE — Assessment & Plan Note (Signed)
Check a1c Low sugar / carb diet Stressed regular exercise, weight loss  

## 2017-09-12 NOTE — Assessment & Plan Note (Addendum)
Check tsh  ? Weight gain over last year related to thyroid Titrate med dose if needed

## 2017-09-12 NOTE — Assessment & Plan Note (Signed)
Check uric acid level ? # Gout episodes Takes diclofenac as needed

## 2017-09-12 NOTE — Assessment & Plan Note (Signed)
S/p R knee replacement Needs to have L knee done Following with ortho Taking tramadol and diclofenac

## 2017-09-13 LAB — TSH: TSH: 12.61 u[IU]/mL — AB (ref 0.35–4.50)

## 2017-09-18 ENCOUNTER — Other Ambulatory Visit: Payer: Self-pay | Admitting: Emergency Medicine

## 2017-09-18 DIAGNOSIS — E038 Other specified hypothyroidism: Secondary | ICD-10-CM

## 2017-09-18 MED ORDER — LEVOTHYROXINE SODIUM 150 MCG PO TABS: 150.0000 ug | ORAL_TABLET | Freq: Every day | ORAL | 0 refills | Status: DC

## 2017-09-28 ENCOUNTER — Other Ambulatory Visit: Payer: Self-pay | Admitting: Emergency Medicine

## 2017-09-28 MED ORDER — LEVOTHYROXINE SODIUM 175 MCG PO TABS: 175.0000 ug | ORAL_TABLET | Freq: Every day | ORAL | 0 refills | Status: DC

## 2017-11-19 ENCOUNTER — Encounter: Payer: Self-pay | Admitting: Family Medicine

## 2017-11-19 ENCOUNTER — Ambulatory Visit (INDEPENDENT_AMBULATORY_CARE_PROVIDER_SITE_OTHER): Payer: Medicare Other | Admitting: Family Medicine

## 2017-11-19 VITALS — BP 120/80 | HR 76 | Temp 98.4°F | Wt 266.8 lb

## 2017-11-19 DIAGNOSIS — R05 Cough: Secondary | ICD-10-CM

## 2017-11-19 DIAGNOSIS — R059 Cough, unspecified: Secondary | ICD-10-CM

## 2017-11-19 MED ORDER — DOXYCYCLINE HYCLATE 100 MG PO CAPS: 100.0000 mg | ORAL_CAPSULE | Freq: Two times a day (BID) | ORAL | 0 refills | Status: DC

## 2017-11-19 MED ORDER — PREDNISONE 20 MG PO TABS
ORAL_TABLET | ORAL | 0 refills | Status: DC
Start: 2017-11-19 — End: 2017-12-25
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
## 2017-11-19 NOTE — Patient Instructions (Signed)
Follow up with Dr Quay Burow in two weeks if no better and sooner for any fever or increased shortness of breath.

## 2017-11-28 ENCOUNTER — Other Ambulatory Visit: Payer: Self-pay | Admitting: Sports Medicine

## 2017-11-28 DIAGNOSIS — G8929 Other chronic pain: Secondary | ICD-10-CM

## 2017-11-28 DIAGNOSIS — M79671 Pain in right foot: Principal | ICD-10-CM

## 2017-11-29 ENCOUNTER — Other Ambulatory Visit: Payer: Self-pay

## 2017-11-29 DIAGNOSIS — G8929 Other chronic pain: Secondary | ICD-10-CM

## 2017-11-29 DIAGNOSIS — M79671 Pain in right foot: Principal | ICD-10-CM

## 2017-11-29 MED ORDER — TRAMADOL HCL 50 MG PO TABS: 50.0000 mg | ORAL_TABLET | Freq: Four times a day (QID) | ORAL | 0 refills | Status: DC

## 2017-12-04 ENCOUNTER — Ambulatory Visit: Payer: Medicare Other | Admitting: Sports Medicine

## 2017-12-24 ENCOUNTER — Other Ambulatory Visit (INDEPENDENT_AMBULATORY_CARE_PROVIDER_SITE_OTHER): Payer: Medicare Other

## 2017-12-24 DIAGNOSIS — E038 Other specified hypothyroidism: Secondary | ICD-10-CM | POA: Diagnosis not present

## 2017-12-24 LAB — TSH: TSH: 8.59 u[IU]/mL — AB (ref 0.35–4.50)

## 2017-12-25 ENCOUNTER — Ambulatory Visit (INDEPENDENT_AMBULATORY_CARE_PROVIDER_SITE_OTHER): Payer: Medicare Other | Admitting: Sports Medicine

## 2017-12-25 ENCOUNTER — Encounter: Payer: Self-pay | Admitting: Sports Medicine

## 2017-12-25 DIAGNOSIS — M170 Bilateral primary osteoarthritis of knee: Secondary | ICD-10-CM | POA: Diagnosis not present

## 2017-12-25 MED ORDER — LEVOTHYROXINE SODIUM 200 MCG PO TABS: 200.0000 ug | ORAL_TABLET | Freq: Every day | ORAL | 0 refills | Status: DC

## 2017-12-25 MED ORDER — DICLOFENAC SODIUM 75 MG PO TBEC: 75.0000 mg | DELAYED_RELEASE_TABLET | Freq: Three times a day (TID) | ORAL | 2 refills | Status: DC

## 2017-12-25 NOTE — Progress Notes (Signed)
CC. Left knee pain  Patient first seen in 2014 with grade 4 DJD of knees He did get TKR on RT which was most painful (Dr Maureen Ralphs) Rehab was slow but now functions well and no significant pain  However left knee is painful every day Diclofenac 75 tid has been best control for his pain No GUI sx CMET package 08/2017 showed good kidney function and no sig. Change in LFTs/ CBC OK  Comes to discuss and for med refill  Past Hx Former smoker Metabolic syndrome  Soc Hx: works [employer] and on feet a lot  ROS Periodic swelling in knee left Feels less stable  Gen Obese W M in NAD BP 136/71   Ht 5\' 11"  (1.803 m)   Wt 255 lb (115.7 kg)   BMI 35.57 kg/m   RT knee Midline scar Good flexion and extension No pain with testing and good strength  LT Knee Obvious degenerative changes Extension is - 3 deg Flexion causes pain at only 100 deg Mild effusion Varus alignment  Gait shows marked varus shift

## 2017-12-25 NOTE — Assessment & Plan Note (Signed)
Since replacement of RT knee the left knee has gradually worsened  Cont to use compression sleeve Diclofenac 75 tid  I think he needs to go ahead with TKR and will consider this winter

## 2018-01-03 ENCOUNTER — Other Ambulatory Visit: Payer: Self-pay | Admitting: *Deleted

## 2018-01-03 DIAGNOSIS — M79671 Pain in right foot: Principal | ICD-10-CM

## 2018-01-03 DIAGNOSIS — G8929 Other chronic pain: Secondary | ICD-10-CM

## 2018-01-03 MED ORDER — TRAMADOL HCL 50 MG PO TABS: 50.0000 mg | ORAL_TABLET | Freq: Four times a day (QID) | ORAL | 0 refills | Status: DC

## 2018-01-17 ENCOUNTER — Ambulatory Visit: Payer: Medicare Other | Admitting: Sports Medicine

## 2018-01-17 ENCOUNTER — Encounter

## 2018-02-03 ENCOUNTER — Other Ambulatory Visit: Payer: Self-pay | Admitting: Sports Medicine

## 2018-02-03 DIAGNOSIS — G8929 Other chronic pain: Secondary | ICD-10-CM

## 2018-02-03 DIAGNOSIS — M79671 Pain in right foot: Principal | ICD-10-CM

## 2018-03-07 ENCOUNTER — Other Ambulatory Visit: Payer: Self-pay | Admitting: Sports Medicine

## 2018-03-07 ENCOUNTER — Other Ambulatory Visit: Payer: Self-pay | Admitting: *Deleted

## 2018-03-07 DIAGNOSIS — G8929 Other chronic pain: Secondary | ICD-10-CM

## 2018-03-07 DIAGNOSIS — M79671 Pain in right foot: Principal | ICD-10-CM

## 2018-03-07 MED ORDER — TRAMADOL HCL 50 MG PO TABS: 50.0000 mg | ORAL_TABLET | Freq: Four times a day (QID) | ORAL | 0 refills | Status: DC

## 2018-03-11 ENCOUNTER — Other Ambulatory Visit (INDEPENDENT_AMBULATORY_CARE_PROVIDER_SITE_OTHER): Payer: Medicare Other

## 2018-03-11 DIAGNOSIS — E038 Other specified hypothyroidism: Secondary | ICD-10-CM

## 2018-03-11 LAB — TSH: TSH: 4.25 u[IU]/mL (ref 0.35–4.50)

## 2018-03-25 ENCOUNTER — Other Ambulatory Visit: Payer: Self-pay | Admitting: Internal Medicine

## 2018-03-26 ENCOUNTER — Other Ambulatory Visit: Payer: Self-pay | Admitting: Sports Medicine

## 2018-03-28 ENCOUNTER — Ambulatory Visit (INDEPENDENT_AMBULATORY_CARE_PROVIDER_SITE_OTHER): Payer: Medicare Other | Admitting: Nurse Practitioner

## 2018-03-28 ENCOUNTER — Encounter: Payer: Self-pay | Admitting: Nurse Practitioner

## 2018-03-28 VITALS — BP 140/70 | HR 103 | Temp 100.9°F | Ht 71.0 in | Wt 269.0 lb

## 2018-03-28 DIAGNOSIS — R509 Fever, unspecified: Secondary | ICD-10-CM | POA: Diagnosis not present

## 2018-03-28 DIAGNOSIS — J209 Acute bronchitis, unspecified: Secondary | ICD-10-CM | POA: Diagnosis not present

## 2018-03-28 DIAGNOSIS — J0140 Acute pansinusitis, unspecified: Secondary | ICD-10-CM | POA: Diagnosis not present

## 2018-03-28 MED ORDER — BENZONATATE 100 MG PO CAPS: 100.0000 mg | ORAL_CAPSULE | Freq: Three times a day (TID) | ORAL | 0 refills | Status: DC | PRN

## 2018-03-28 MED ORDER — ALBUTEROL SULFATE (2.5 MG/3ML) 0.083% IN NEBU
2.5000 mg | INHALATION_SOLUTION | Freq: Once | RESPIRATORY_TRACT | Status: AC
  Administered 2018-03-28: 2.5 mg via RESPIRATORY_TRACT

## 2018-03-28 MED ORDER — ALBUTEROL SULFATE HFA 108 (90 BASE) MCG/ACT IN AERS: 1.0000 | INHALATION_SPRAY | Freq: Four times a day (QID) | RESPIRATORY_TRACT | 0 refills | Status: DC | PRN

## 2018-03-28 MED ORDER — DM-GUAIFENESIN ER 30-600 MG PO TB12: 1.0000 | ORAL_TABLET | Freq: Two times a day (BID) | ORAL | 0 refills | Status: DC | PRN

## 2018-03-28 MED ORDER — METHYLPREDNISOLONE ACETATE 40 MG/ML IJ SUSP
40.0000 mg | Freq: Once | INTRAMUSCULAR | Status: AC
  Administered 2018-03-28: 40 mg via INTRAMUSCULAR

## 2018-03-28 MED ORDER — LEVOFLOXACIN 500 MG PO TABS: 500.0000 mg | ORAL_TABLET | Freq: Every day | ORAL | 0 refills | Status: DC

## 2018-03-28 NOTE — Patient Instructions (Addendum)
Maintain adequate oral hydration  Start medications today, with food.  You may alternate between robitussin and benzonatate for cough.  Use albuterol inhaler as needed for cough, wheezing and SOB.  Call office if no improvement by Monday.

## 2018-03-28 NOTE — Progress Notes (Signed)
Subjective:  Patient ID: Dalton Murphy, male    DOB: 09-01-1950  Age: 67 y.o. MRN: [id number]  CC: Cough (pt is coughing yellow mucus,SOB,wheezing,congestions,fatigue. going on 1 wk. tried LMB:EMLJQGB relieved med. using CPAP at home. work note?)   Cough  This is a new problem. The current episode started in the past 7 days. The problem has been gradually worsening. The problem occurs constantly. The cough is productive of purulent sputum. Associated symptoms include chest pain, chills, a fever, headaches, myalgias, nasal congestion, postnasal drip, rhinorrhea, shortness of breath and wheezing. The symptoms are aggravated by lying down and cold air. He has tried OTC cough suppressant for the symptoms. The treatment provided no relief.   Reviewed past Medical, Social and Family history today.  Outpatient Medications Prior to Visit  Medication Sig Dispense Refill  . diclofenac (VOLTAREN) 75 MG EC tablet Take one tab TID 90 tablet 2  . Glucosamine HCl (GLUCOSAMINE PO) Take by mouth.    . levothyroxine (SYNTHROID, LEVOTHROID) 200 MCG tablet TAKE 1 TABLET BY MOUTH ONCE DAILY BEFORE BREAKFAST 90 tablet 1  . Multiple Vitamins-Minerals (MULTIVITAMIN MEN 50+) TABS Take by mouth.    . traMADol (ULTRAM) 50 MG tablet Take 1 tablet (50 mg total) by mouth 4 (four) times daily. 120 tablet 0   No facility-administered medications prior to visit.     ROS See HPI  Objective:  BP 140/70   Pulse (!) 103   Temp (!) 100.9 F (38.3 C) (Oral)   Ht 5\' 11"  (1.803 m)   Wt 269 lb (122 kg)   SpO2 93%   BMI 37.52 kg/m   BP Readings from Last 3 Encounters:  03/28/18 140/70  12/25/17 136/71  11/19/17 120/80    Wt Readings from Last 3 Encounters:  03/28/18 269 lb (122 kg)  12/25/17 255 lb (115.7 kg)  11/19/17 266 lb 12.8 oz (121 kg)    Physical Exam Constitutional:      Appearance: He is obese.  HENT:     Nose: Congestion and rhinorrhea present.     Right Sinus: Maxillary sinus tenderness and  frontal sinus tenderness present.     Left Sinus: Maxillary sinus tenderness and frontal sinus tenderness present.     Mouth/Throat:     Pharynx: Posterior oropharyngeal erythema present.  Cardiovascular:     Rate and Rhythm: Normal rate and regular rhythm.  Pulmonary:     Effort: Pulmonary effort is normal. No respiratory distress.     Breath sounds: Wheezing and rales present.  Musculoskeletal:     Right lower leg: No edema.     Left lower leg: No edema.  Neurological:     Mental Status: He is alert.     Lab Results  Component Value Date   WBC 6.3 09/12/2017   HGB 14.2 09/12/2017   HCT 41.5 09/12/2017   PLT 216.0 09/12/2017   GLUCOSE 97 09/12/2017   CHOL 210 (H) 09/12/2017   TRIG 292.0 (H) 09/12/2017   HDL 40.60 09/12/2017   LDLDIRECT 124.0 09/12/2017   LDLCALC 41 10/06/2013   ALT 61 (H) 09/12/2017   AST 35 09/12/2017   NA 142 09/12/2017   K 4.9 09/12/2017   CL 106 09/12/2017   CREATININE 0.99 09/12/2017   BUN 20 09/12/2017   CO2 28 09/12/2017   TSH 4.25 03/11/2018   PSA 2.05 09/12/2017   INR 1.00 09/12/2016   HGBA1C 5.8 09/12/2017    Assessment & Plan:   Kamran was seen today for  cough.  Diagnoses and all orders for this visit:  Acute bronchitis, unspecified organism -     albuterol (PROVENTIL) (2.5 MG/3ML) 0.083% nebulizer solution 2.5 mg -     methylPREDNISolone acetate (DEPO-MEDROL) injection 40 mg -     levofloxacin (LEVAQUIN) 500 MG tablet; Take 1 tablet (500 mg total) by mouth daily. -     albuterol (PROVENTIL HFA;VENTOLIN HFA) 108 (90 Base) MCG/ACT inhaler; Inhale 1-2 puffs into the lungs every 6 (six) hours as needed. -     dextromethorphan-guaiFENesin (MUCINEX DM) 30-600 MG 12hr tablet; Take 1 tablet by mouth 2 (two) times daily as needed for cough. -     benzonatate (TESSALON) 100 MG capsule; Take 1 capsule (100 mg total) by mouth 3 (three) times daily as needed for cough.  Fever and chills -     levofloxacin (LEVAQUIN) 500 MG tablet; Take 1  tablet (500 mg total) by mouth daily.  Acute non-recurrent pansinusitis -     methylPREDNISolone acetate (DEPO-MEDROL) injection 40 mg -     levofloxacin (LEVAQUIN) 500 MG tablet; Take 1 tablet (500 mg total) by mouth daily. -     dextromethorphan-guaiFENesin (MUCINEX DM) 30-600 MG 12hr tablet; Take 1 tablet by mouth 2 (two) times daily as needed for cough.   I am having Austin Miles "MIKE" start on levofloxacin, albuterol, dextromethorphan-guaiFENesin, and benzonatate. I am also having him maintain his MULTIVITAMIN MEN 50+, traMADol, levothyroxine, diclofenac, and Glucosamine HCl (GLUCOSAMINE PO). We administered albuterol and methylPREDNISolone acetate.  Meds ordered this encounter  Medications  . albuterol (PROVENTIL) (2.5 MG/3ML) 0.083% nebulizer solution 2.5 mg  . methylPREDNISolone acetate (DEPO-MEDROL) injection 40 mg  . levofloxacin (LEVAQUIN) 500 MG tablet    Sig: Take 1 tablet (500 mg total) by mouth daily.    Dispense:  7 tablet    Refill:  0    Order Specific Question:   Supervising Provider    Answer:   Lucille Passy [[id number]]  . albuterol (PROVENTIL HFA;VENTOLIN HFA) 108 (90 Base) MCG/ACT inhaler    Sig: Inhale 1-2 puffs into the lungs every 6 (six) hours as needed.    Dispense:  1 Inhaler    Refill:  0    Order Specific Question:   Supervising Provider    Answer:   Lucille Passy [[id number]]  . dextromethorphan-guaiFENesin (MUCINEX DM) 30-600 MG 12hr tablet    Sig: Take 1 tablet by mouth 2 (two) times daily as needed for cough.    Dispense:  14 tablet    Refill:  0    Order Specific Question:   Supervising Provider    Answer:   Lucille Passy [[id number]]  . benzonatate (TESSALON) 100 MG capsule    Sig: Take 1 capsule (100 mg total) by mouth 3 (three) times daily as needed for cough.    Dispense:  20 capsule    Refill:  0    Order Specific Question:   Supervising Provider    Answer:   Lucille Passy [[id number]]    Problem List Items Addressed This Visit    None    Visit  Diagnoses    Acute bronchitis, unspecified organism    -  Primary   Relevant Medications   albuterol (PROVENTIL) (2.5 MG/3ML) 0.083% nebulizer solution 2.5 mg (Completed)   methylPREDNISolone acetate (DEPO-MEDROL) injection 40 mg (Completed)   levofloxacin (LEVAQUIN) 500 MG tablet   albuterol (PROVENTIL HFA;VENTOLIN HFA) 108 (90 Base) MCG/ACT inhaler   dextromethorphan-guaiFENesin (MUCINEX DM) 30-600 MG 12hr tablet  benzonatate (TESSALON) 100 MG capsule   Fever and chills       Relevant Medications   levofloxacin (LEVAQUIN) 500 MG tablet   Acute non-recurrent pansinusitis       Relevant Medications   methylPREDNISolone acetate (DEPO-MEDROL) injection 40 mg (Completed)   levofloxacin (LEVAQUIN) 500 MG tablet   dextromethorphan-guaiFENesin (MUCINEX DM) 30-600 MG 12hr tablet   benzonatate (TESSALON) 100 MG capsule       Follow-up: Return if symptoms worsen or fail to improve.  Wilfred Lacy, NP
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
## 2018-04-03 ENCOUNTER — Other Ambulatory Visit (INDEPENDENT_AMBULATORY_CARE_PROVIDER_SITE_OTHER): Payer: Medicare Other

## 2018-04-03 ENCOUNTER — Encounter: Payer: Self-pay | Admitting: Internal Medicine

## 2018-04-03 ENCOUNTER — Ambulatory Visit (INDEPENDENT_AMBULATORY_CARE_PROVIDER_SITE_OTHER): Payer: Medicare Other | Admitting: Internal Medicine

## 2018-04-03 VITALS — BP 128/78 | HR 78 | Temp 98.9°F | Resp 16 | Ht 71.0 in | Wt 265.0 lb

## 2018-04-03 DIAGNOSIS — R3915 Urgency of urination: Secondary | ICD-10-CM

## 2018-04-03 DIAGNOSIS — E038 Other specified hypothyroidism: Secondary | ICD-10-CM

## 2018-04-03 DIAGNOSIS — E781 Pure hyperglyceridemia: Secondary | ICD-10-CM

## 2018-04-03 DIAGNOSIS — Z23 Encounter for immunization: Secondary | ICD-10-CM | POA: Diagnosis not present

## 2018-04-03 DIAGNOSIS — R062 Wheezing: Secondary | ICD-10-CM | POA: Diagnosis not present

## 2018-04-03 DIAGNOSIS — R7303 Prediabetes: Secondary | ICD-10-CM

## 2018-04-03 DIAGNOSIS — G4730 Sleep apnea, unspecified: Secondary | ICD-10-CM | POA: Diagnosis not present

## 2018-04-03 LAB — COMPREHENSIVE METABOLIC PANEL
ALK PHOS: 74 U/L (ref 39–117)
ALT: 38 U/L (ref 0–53)
AST: 25 U/L (ref 0–37)
Albumin: 4.3 g/dL (ref 3.5–5.2)
BILIRUBIN TOTAL: 0.6 mg/dL (ref 0.2–1.2)
BUN: 20 mg/dL (ref 6–23)
CO2: 27 mEq/L (ref 19–32)
Calcium: 9.1 mg/dL (ref 8.4–10.5)
Chloride: 106 mEq/L (ref 96–112)
Creatinine, Ser: 0.87 mg/dL (ref 0.40–1.50)
GFR: 92.87 mL/min (ref >60.00)
Glucose, Bld: 101 mg/dL — ABNORMAL HIGH (ref 70–99)
POTASSIUM: 4.5 meq/L (ref 3.5–5.1)
Sodium: 141 mEq/L (ref 135–145)
TOTAL PROTEIN: 7.6 g/dL (ref 6.0–8.3)

## 2018-04-03 LAB — LIPID PANEL
Cholesterol: 128 mg/dL (ref 0–200)
HDL: 31.7 mg/dL — ABNORMAL LOW (ref >39.00)
LDL Cholesterol: 69 mg/dL (ref 0–99)
NonHDL: 96.45
Total CHOL/HDL Ratio: 4
Triglycerides: 137 mg/dL (ref 0.0–149.0)
VLDL: 27.4 mg/dL (ref 0.0–40.0)

## 2018-04-03 LAB — HEMOGLOBIN A1C: Hgb A1c MFr Bld: 6.2 % (ref 4.6–6.5)

## 2018-04-03 LAB — TSH: TSH: 2.17 u[IU]/mL (ref 0.35–4.50)

## 2018-04-03 MED ORDER — PREDNISONE 20 MG PO TABS: 40.0000 mg | ORAL_TABLET | Freq: Every day | ORAL | 0 refills | Status: DC

## 2018-04-03 NOTE — Assessment & Plan Note (Signed)
Clinically euthyroid Check tsh  Titrate med dose if needed  

## 2018-04-03 NOTE — Assessment & Plan Note (Signed)
Recent bronchitis Still having wheezing diffusely on exam and cough Completed Levaquin-I do not think he needs additional antibiotics Prednisone 40 mg daily for 5 days Albuterol as needed

## 2018-04-03 NOTE — Assessment & Plan Note (Signed)
Check lipid panel  Not on any cholesterol medication Regular exercise and healthy diet encouraged

## 2018-04-03 NOTE — Assessment & Plan Note (Signed)
OSA on cpap - has an old machine Ref pulm Needs a new machine/ supplies

## 2018-04-03 NOTE — Assessment & Plan Note (Signed)
Check a1c Low sugar / carb diet Stressed regular exercise, weight loss  

## 2018-04-03 NOTE — Assessment & Plan Note (Signed)
Experiencing urinary urgency and weakened stream-this is chronic and not acute Just completed Levaquin so not likely an infection Likely related to BPH PSA earlier this year normal Discussed treatment options including Flomax, seeing urology He may start with an over-the-counter prostate medication and let me know if he would like to see urology

## 2018-04-03 NOTE — Patient Instructions (Addendum)
  Tests ordered today. Your results will be released to [place] (or called to you) after review, usually within 72hours after test completion. If any changes need to be made, you will be notified at that same time.  You had the pneumonia and flu vaccine today  Medications reviewed and updated.  Changes include :   Start prednisone 40 mg daily.     A referral was ordered for pulmonary   Please followup in 6 months

## 2018-04-04 ENCOUNTER — Encounter: Payer: Self-pay | Admitting: Internal Medicine

## 2018-04-07 ENCOUNTER — Other Ambulatory Visit: Payer: Self-pay | Admitting: Sports Medicine

## 2018-04-07 DIAGNOSIS — G8929 Other chronic pain: Secondary | ICD-10-CM

## 2018-04-07 DIAGNOSIS — M79671 Pain in right foot: Principal | ICD-10-CM

## 2018-04-15 DIAGNOSIS — M1712 Unilateral primary osteoarthritis, left knee: Secondary | ICD-10-CM | POA: Diagnosis not present

## 2018-04-15 DIAGNOSIS — M25562 Pain in left knee: Secondary | ICD-10-CM | POA: Diagnosis not present
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
## 2018-04-29 ENCOUNTER — Encounter: Payer: Self-pay | Admitting: Internal Medicine

## 2018-04-29 ENCOUNTER — Ambulatory Visit (INDEPENDENT_AMBULATORY_CARE_PROVIDER_SITE_OTHER): Payer: Medicare Other | Admitting: Internal Medicine

## 2018-04-29 VITALS — BP 142/70 | HR 72 | Temp 95.5°F | Resp 16 | Ht 71.0 in | Wt 268.0 lb

## 2018-04-29 DIAGNOSIS — M1712 Unilateral primary osteoarthritis, left knee: Secondary | ICD-10-CM

## 2018-04-29 DIAGNOSIS — G4730 Sleep apnea, unspecified: Secondary | ICD-10-CM | POA: Diagnosis not present

## 2018-04-29 DIAGNOSIS — Z01818 Encounter for other preprocedural examination: Secondary | ICD-10-CM | POA: Diagnosis not present

## 2018-04-29 DIAGNOSIS — E038 Other specified hypothyroidism: Secondary | ICD-10-CM | POA: Diagnosis not present

## 2018-04-29 NOTE — Assessment & Plan Note (Signed)
Currently taking diclofenac and tramadol for the pain Scheduled to have total knee replacement in March Conservative treatment has failed and agree with surgery Overall low risk we will send note to orthopedics

## 2018-04-29 NOTE — Assessment & Plan Note (Signed)
Using CPAP nightly Will follow up with pulmonary-referral ordered previously-he will call to schedule-he does need new equipment

## 2018-04-29 NOTE — Assessment & Plan Note (Signed)
Low risk for schedule surgery-left total knee replacement in March Will stop NSAIDs 1 week prior to surgery, okay to continue tramadol EKG: Sinus rhythm at 64 bpm, nonspecific QRS widening and anterior fascicular block.  No significant change from previous EKG Recent blood work unremarkable-no need to repeat Clearance form will be faxed to orthopedics

## 2018-04-29 NOTE — Assessment & Plan Note (Signed)
Last TSH in normal range Continue current dose of levothyroxine

## 2018-04-29 NOTE — Patient Instructions (Addendum)
[facility] for your sleep apnea Address: [address], [place], [place] [address]  Phone: [phone]    You had an EKG today.  It looks stable compared to your last.    Stop diclofenac and do not take any aspirin, advil and aleve for one week prior to surgery.    You can continue the tramadol.

## 2018-05-01 ENCOUNTER — Telehealth: Payer: Self-pay

## 2018-05-01 NOTE — Telephone Encounter (Signed)
Surgical clearance form faxed to [facility]

## 2018-05-10 DIAGNOSIS — M1712 Unilateral primary osteoarthritis, left knee: Secondary | ICD-10-CM | POA: Diagnosis not present

## 2018-05-12 ENCOUNTER — Other Ambulatory Visit: Payer: Self-pay | Admitting: Sports Medicine

## 2018-05-12 DIAGNOSIS — M79671 Pain in right foot: Principal | ICD-10-CM

## 2018-05-12 DIAGNOSIS — G8929 Other chronic pain: Secondary | ICD-10-CM
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
## 2018-05-28 ENCOUNTER — Encounter: Payer: Self-pay | Admitting: Internal Medicine

## 2018-05-28 ENCOUNTER — Ambulatory Visit (INDEPENDENT_AMBULATORY_CARE_PROVIDER_SITE_OTHER): Payer: Medicare Other | Admitting: Internal Medicine

## 2018-05-28 VITALS — BP 126/64 | HR 63 | Temp 98.7°F | Resp 16 | Ht 71.0 in | Wt 264.0 lb

## 2018-05-28 DIAGNOSIS — J209 Acute bronchitis, unspecified: Secondary | ICD-10-CM

## 2018-05-28 DIAGNOSIS — R062 Wheezing: Secondary | ICD-10-CM

## 2018-05-28 MED ORDER — METHYLPREDNISOLONE ACETATE 80 MG/ML IJ SUSP
80.0000 mg | Freq: Once | INTRAMUSCULAR | Status: AC
  Administered 2018-05-28: 80 mg via INTRAMUSCULAR

## 2018-05-28 MED ORDER — DOXYCYCLINE HYCLATE 100 MG PO TABS: 100.0000 mg | ORAL_TABLET | Freq: Two times a day (BID) | ORAL | 0 refills | Status: DC

## 2018-05-28 NOTE — Patient Instructions (Signed)
You received a steroid injection today.    Use the albuterol inhaler.   Take the antibiotic and complete the entire course.    Continue the mucinex and increased fluids.

## 2018-05-28 NOTE — Assessment & Plan Note (Signed)
Symptoms consistent with acute bacterial bronchitis with expiratory wheeze Start doxycycline Depo-Medrol 80 mg IM x1 for diffuse expiratory wheeze Continue Mucinex He appears mildly dehydrated-increase fluid intake Albuterol as needed Call if no improvement

## 2018-05-28 NOTE — Assessment & Plan Note (Signed)
Expiratory wheeze secondary to acute bacterial bronchitis Start doxycycline Depo-Medrol 80 mg IM  Continue Mucinex He appears mildly dehydrated-increase fluid intake Albuterol as needed Call if no improvement

## 2018-05-29 ENCOUNTER — Ambulatory Visit (INDEPENDENT_AMBULATORY_CARE_PROVIDER_SITE_OTHER): Payer: Medicare Other | Admitting: Pulmonary Disease

## 2018-05-29 VITALS — BP 122/72 | HR 78 | Ht 71.0 in | Wt 264.0 lb

## 2018-05-29 DIAGNOSIS — G4733 Obstructive sleep apnea (adult) (pediatric): Secondary | ICD-10-CM

## 2018-05-29 NOTE — Patient Instructions (Signed)
History of obstructive sleep apnea  We will schedule you for home sleep study Treatment with an auto titrating CPAP once we have results  I will see you back in about 3 months  Call with any significant concerns Sleep Apnea Sleep apnea is a condition in which breathing pauses or becomes shallow during sleep. Episodes of sleep apnea usually last 10 seconds or longer, and they may occur as many as 20 times an hour. Sleep apnea disrupts your sleep and keeps your body from getting the rest that it needs. This condition can increase your risk of certain health problems, including:  Heart attack.  Stroke.  Obesity.  Diabetes.  Heart failure.  Irregular heartbeat. There are three kinds of sleep apnea:  Obstructive sleep apnea. This kind is caused by a blocked or collapsed airway.  Central sleep apnea. This kind happens when the part of the brain that controls breathing does not send the correct signals to the muscles that control breathing.  Mixed sleep apnea. This is a combination of obstructive and central sleep apnea. What are the causes? The most common cause of this condition is a collapsed or blocked airway. An airway can collapse or become blocked if:  Your throat muscles are abnormally relaxed.  Your tongue and tonsils are larger than normal.  You are overweight.  Your airway is smaller than normal. What increases the risk? This condition is more likely to develop in people who:  Are overweight.  Smoke.  Have a smaller than normal airway.  Are elderly.  Are male.  Drink alcohol.  Take sedatives or tranquilizers.  Have a family history of sleep apnea. What are the signs or symptoms? Symptoms of this condition include:  Trouble staying asleep.  Daytime sleepiness and tiredness.  Irritability.  Loud snoring.  Morning headaches.  Trouble concentrating.  Forgetfulness.  Decreased interest in sex.  Unexplained sleepiness.  Mood  swings.  Personality changes.  Feelings of depression.  Waking up often during the night to urinate.  Dry mouth.  Sore throat. How is this diagnosed? This condition may be diagnosed with:  A medical history.  A physical exam.  A series of tests that are done while you are sleeping (sleep study). These tests are usually done in a sleep lab, but they may also be done at home. How is this treated? Treatment for this condition aims to restore normal breathing and to ease symptoms during sleep. It may involve managing health issues that can affect breathing, such as high blood pressure or obesity. Treatment may include:  Sleeping on your side.  Using a decongestant if you have nasal congestion.  Avoiding the use of depressants, including alcohol, sedatives, and narcotics.  Losing weight if you are overweight.  Making changes to your diet.  Quitting smoking.  Using a device to open your airway while you sleep, such as: ? An oral appliance. This is a custom-made mouthpiece that shifts your lower jaw forward. ? A continuous positive airway pressure (CPAP) device. This device delivers oxygen to your airway through a mask. ? A nasal expiratory positive airway pressure (EPAP) device. This device has valves that you put into each nostril. ? A bi-level positive airway pressure (BPAP) device. This device delivers oxygen to your airway through a mask.  Surgery if other treatments do not work. During surgery, excess tissue is removed to create a wider airway. It is important to get treatment for sleep apnea. Without treatment, this condition can lead to:  High blood pressure.  Coronary artery disease.  (Men) An inability to achieve or maintain an erection (impotence).  Reduced thinking abilities. Follow these instructions at home:  Make any lifestyle changes that your health care provider recommends.  Eat a healthy, well-balanced diet.  Take over-the-counter and prescription  medicines only as told by your health care provider.  Avoid using depressants, including alcohol, sedatives, and narcotics.  Take steps to lose weight if you are overweight.  If you were given a device to open your airway while you sleep, use it only as told by your health care provider.  Do not use any tobacco products, such as cigarettes, chewing tobacco, and e-cigarettes. If you need help quitting, ask your health care provider.  Keep all follow-up visits as told by your health care provider. This is important. Contact a health care provider if:  The device that you received to open your airway during sleep is uncomfortable or does not seem to be working.  Your symptoms do not improve.  Your symptoms get worse. Get help right away if:  You develop chest pain.  You develop shortness of breath.  You develop discomfort in your back, arms, or stomach.  You have trouble speaking.  You have weakness on one side of your body.  You have drooping in your face. These symptoms may represent a serious problem that is an emergency. Do not wait to see if the symptoms will go away. Get medical help right away. Call your local emergency services (911 in the [place] Do not drive yourself to the hospital. This information is not intended to replace advice given to you by your health care provider. Make sure you discuss any questions you have with your health care provider. Document Released: 03/24/2002 Document Revised: 10/30/2016 Document Reviewed: 01/11/2015 Elsevier Interactive Patient Education  2019 [employer].
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
## 2018-06-10 DIAGNOSIS — G4733 Obstructive sleep apnea (adult) (pediatric): Secondary | ICD-10-CM | POA: Diagnosis not present

## 2018-06-11 ENCOUNTER — Other Ambulatory Visit: Payer: Self-pay | Admitting: Sports Medicine

## 2018-06-11 DIAGNOSIS — G8929 Other chronic pain: Secondary | ICD-10-CM

## 2018-06-11 DIAGNOSIS — M79671 Pain in right foot: Principal | ICD-10-CM

## 2018-06-12 ENCOUNTER — Other Ambulatory Visit: Payer: Self-pay

## 2018-06-14 DIAGNOSIS — G4733 Obstructive sleep apnea (adult) (pediatric): Secondary | ICD-10-CM | POA: Diagnosis not present

## 2018-06-18 NOTE — Patient Instructions (Signed)
[place] [facility]  06/18/2018   Your procedure is scheduled on: 06-24-18   Report to [facility] Main  Entrance               Report to admitting at       1015 AM    Call this number if you have problems the morning of surgery [phone]    Remember: Do not eat food  Or drink liquids   :After Midnight.   BRUSH YOUR TEETH MORNING OF SURGERY AND RINSE YOUR MOUTH OUT, NO CHEWING GUM CANDY OR MINTS.     Take these medicines the morning of surgery with A SIP OF WATER: levothyroxine                                 You may not have any metal on your body including hair pins and              piercings  Do not wear jewelry,  lotions, powders or perfumes, deodorant                       Men may shave face and neck.   Do not bring valuables to the hospital. [facility].  Contacts, dentures or bridgework may not be worn into surgery.  Leave suitcase in the car. After surgery it may be brought to your room.     [place] - Preparing for Surgery Before surgery, you can play an important role.  Because skin is not sterile, your skin needs to be as free of germs as possible.  You can reduce the number of germs on your skin by washing with CHG (chlorahexidine gluconate) soap before surgery.  CHG is an antiseptic cleaner which kills germs and bonds with the skin to continue killing germs even after washing. Please DO NOT use if you have an allergy to CHG or antibacterial soaps.  If your skin becomes reddened/irritated stop using the CHG and inform your nurse when you arrive at Short Stay. Do not shave (including legs and underarms) for at least 48 hours prior to the first CHG shower.  You may shave your face/neck. Please follow these instructions carefully:  1.  Shower with CHG Soap the night before surgery and the  morning of Surgery.  2.  If you choose to wash your hair, wash your hair first as usual with your  normal   shampoo.  3.  After you shampoo, rinse your hair and body thoroughly to remove the  shampoo.                           4.  Use CHG as you would any other liquid soap.  You can apply chg directly  to the skin and wash                       Gently with a scrungie or clean washcloth.  5.  Apply the CHG Soap to your body ONLY FROM THE NECK DOWN.   Do not use on face/ open  Wound or open sores. Avoid contact with eyes, ears mouth and genitals (private parts).                       Wash face,  Genitals (private parts) with your normal soap.             6.  Wash thoroughly, paying special attention to the area where your surgery  will be performed.  7.  Thoroughly rinse your body with warm water from the neck down.  8.  DO NOT shower/wash with your normal soap after using and rinsing off  the CHG Soap.                9.  Pat yourself dry with a clean towel.            10.  Wear clean pajamas.            11.  Place clean sheets on your bed the night of your first shower and do not  sleep with pets. Day of Surgery : Do not apply any lotions/deodorants the morning of surgery.   FAILURE TO FOLLOW THESE INSTRUCTIONS MAY RESULT IN THE CANCELLATION OF YOUR SURGERY PATIENT SIGNATURE_________________________________  NURSE SIGNATURE__________________________________  ________________________________________________________________________  WHAT IS A BLOOD TRANSFUSION? Blood Transfusion Information  A transfusion is the replacement of blood or some of its parts. Blood is made up of multiple cells which provide different functions.  Red blood cells carry oxygen and are used for blood loss replacement.  White blood cells fight against infection.  Platelets control bleeding.  Plasma helps clot blood.  Other blood products are available for specialized needs, such as hemophilia or other clotting disorders. BEFORE THE TRANSFUSION  Who gives blood for transfusions?   Healthy  volunteers who are fully evaluated to make sure their blood is safe. This is blood bank blood. Transfusion therapy is the safest it has ever been in the practice of medicine. Before blood is taken from a donor, a complete history is taken to make sure that person has no history of diseases nor engages in risky social behavior (examples are intravenous drug use or sexual activity with multiple partners). The donor's travel history is screened to minimize risk of transmitting infections, such as malaria. The donated blood is tested for signs of infectious diseases, such as HIV and hepatitis. The blood is then tested to be sure it is compatible with you in order to minimize the chance of a transfusion reaction. If you or a relative donates blood, this is often done in anticipation of surgery and is not appropriate for emergency situations. It takes many days to process the donated blood. RISKS AND COMPLICATIONS Although transfusion therapy is very safe and saves many lives, the main dangers of transfusion include:   Getting an infectious disease.  Developing a transfusion reaction. This is an allergic reaction to something in the blood you were given. Every precaution is taken to prevent this. The decision to have a blood transfusion has been considered carefully by your caregiver before blood is given. Blood is not given unless the benefits outweigh the risks. AFTER THE TRANSFUSION  Right after receiving a blood transfusion, you will usually feel much better and more energetic. This is especially true if your red blood cells have gotten low (anemic). The transfusion raises the level of the red blood cells which carry oxygen, and this usually causes an energy increase.  The nurse administering the transfusion will monitor you carefully  for complications. HOME CARE INSTRUCTIONS  No special instructions are needed after a transfusion. You may find your energy is better. Speak with your caregiver about any  limitations on activity for underlying diseases you may have. SEEK MEDICAL CARE IF:   Your condition is not improving after your transfusion.  You develop redness or irritation at the intravenous (IV) site. SEEK IMMEDIATE MEDICAL CARE IF:  Any of the following symptoms occur over the next 12 hours:  Shaking chills.  You have a temperature by mouth above 102 F (38.9 C), not controlled by medicine.  Chest, back, or muscle pain.  People around you feel you are not acting correctly or are confused.  Shortness of breath or difficulty breathing.  Dizziness and fainting.  You get a rash or develop hives.  You have a decrease in urine output.  Your urine turns a dark color or changes to pink, red, or brown. Any of the following symptoms occur over the next 10 days:  You have a temperature by mouth above 102 F (38.9 C), not controlled by medicine.  Shortness of breath.  Weakness after normal activity.  The white part of the eye turns yellow (jaundice).  You have a decrease in the amount of urine or are urinating less often.  Your urine turns a dark color or changes to pink, red, or brown. Document Released: 03/31/2000 Document Revised: 06/26/2011 Document Reviewed: 11/18/2007 ExitCare Patient Information 2014 [place].  _______________________________________________________________________  Incentive Spirometer  An incentive spirometer is a tool that can help keep your lungs clear and active. This tool measures how well you are filling your lungs with each breath. Taking long deep breaths may help reverse or decrease the chance of developing breathing (pulmonary) problems (especially infection) following:  A long period of time when you are unable to move or be active. BEFORE THE PROCEDURE   If the spirometer includes an indicator to show your best effort, your nurse or respiratory therapist will set it to a desired goal.  If possible, sit up straight or lean  slightly forward. Try not to slouch.  Hold the incentive spirometer in an upright position. INSTRUCTIONS FOR USE  1. Sit on the edge of your bed if possible, or sit up as far as you can in bed or on a chair. 2. Hold the incentive spirometer in an upright position. 3. Breathe out normally. 4. Place the mouthpiece in your mouth and seal your lips tightly around it. 5. Breathe in slowly and as deeply as possible, raising the piston or the ball toward the top of the column. 6. Hold your breath for 3-5 seconds or for as long as possible. Allow the piston or ball to fall to the bottom of the column. 7. Remove the mouthpiece from your mouth and breathe out normally. 8. Rest for a few seconds and repeat Steps 1 through 7 at least 10 times every 1-2 hours when you are awake. Take your time and take a few normal breaths between deep breaths. 9. The spirometer may include an indicator to show your best effort. Use the indicator as a goal to work toward during each repetition. 10. After each set of 10 deep breaths, practice coughing to be sure your lungs are clear. If you have an incision (the cut made at the time of surgery), support your incision when coughing by placing a pillow or rolled up towels firmly against it. Once you are able to get out of bed, walk around indoors and cough  well. You may stop using the incentive spirometer when instructed by your caregiver.  RISKS AND COMPLICATIONS  Take your time so you do not get dizzy or light-headed.  If you are in pain, you may need to take or ask for pain medication before doing incentive spirometry. It is harder to take a deep breath if you are having pain. AFTER USE  Rest and breathe slowly and easily.  It can be helpful to keep track of a log of your progress. Your caregiver can provide you with a simple table to help with this. If you are using the spirometer at home, follow these instructions: [place] IF:   You are having difficultly  using the spirometer.  You have trouble using the spirometer as often as instructed.  Your pain medication is not giving enough relief while using the spirometer.  You develop fever of 100.5 F (38.1 C) or higher. SEEK IMMEDIATE MEDICAL CARE IF:   You cough up bloody sputum that had not been present before.  You develop fever of 102 F (38.9 C) or greater.  You develop worsening pain at or near the incision site. MAKE SURE YOU:   Understand these instructions.  Will watch your condition.  Will get help right away if you are not doing well or get worse. Document Released: 08/14/2006 Document Revised: 06/26/2011 Document Reviewed: 10/15/2006 [facility] Patient Information 2014 [place], [place].   ___________________________________________________________________

## 2018-06-18 NOTE — Progress Notes (Addendum)
ekg 04-29-18 epic  Clearance Dr. Billey Gosling 04-29-18 epic

## 2018-06-19 ENCOUNTER — Encounter (HOSPITAL_COMMUNITY): Payer: Self-pay

## 2018-06-19 ENCOUNTER — Other Ambulatory Visit: Payer: Self-pay

## 2018-06-19 ENCOUNTER — Encounter (HOSPITAL_COMMUNITY)
Admission: RE | Admit: 2018-06-19 | Discharge: 2018-06-19 | Disposition: A | Discharge status: Home / Self Care | Payer: Medicare Other | Source: Ambulatory Visit | Attending: Orthopedic Surgery | Admitting: Orthopedic Surgery

## 2018-06-19 DIAGNOSIS — Z01812 Encounter for preprocedural laboratory examination: Secondary | ICD-10-CM | POA: Insufficient documentation

## 2018-06-19 HISTORY — DX: Prediabetes: R73.03

## 2018-06-19 LAB — CBC
HCT: 43.9 % (ref 39.0–52.0)
Hemoglobin: 14.2 g/dL (ref 13.0–17.0)
MCH: 29.2 pg (ref 26.0–34.0)
MCHC: 32.3 g/dL (ref 30.0–36.0)
MCV: 90.1 fL (ref 80.0–100.0)
Platelets: 175 10*3/uL (ref 150–400)
RBC: 4.87 MIL/uL (ref 4.22–5.81)
RDW: 13.9 % (ref 11.5–15.5)
WBC: 6.1 10*3/uL (ref 4.0–10.5)
nRBC: 0 % (ref 0.0–0.2)

## 2018-06-19 LAB — COMPREHENSIVE METABOLIC PANEL
ALT: 47 U/L — ABNORMAL HIGH (ref 0–44)
AST: 30 U/L (ref 15–41)
Albumin: 4.2 g/dL (ref 3.5–5.0)
Alkaline Phosphatase: 73 U/L (ref 38–126)
Anion gap: 10 (ref 5–15)
BUN: 18 mg/dL (ref 8–23)
CHLORIDE: 103 mmol/L (ref 98–111)
CO2: 25 mmol/L (ref 22–32)
Calcium: 8.9 mg/dL (ref 8.9–10.3)
Creatinine, Ser: 0.7 mg/dL (ref 0.61–1.24)
GFR calc Af Amer: >60 mL/min (ref >60)
GFR calc non Af Amer: >60 mL/min (ref >60)
Glucose, Bld: 109 mg/dL — ABNORMAL HIGH (ref 70–99)
POTASSIUM: 4.3 mmol/L (ref 3.5–5.1)
Sodium: 138 mmol/L (ref 135–145)
Total Bilirubin: 1.3 mg/dL — ABNORMAL HIGH (ref 0.3–1.2)
Total Protein: 7.5 g/dL (ref 6.5–8.1)

## 2018-06-19 LAB — HEMOGLOBIN A1C
Hgb A1c MFr Bld: 5.7 % — ABNORMAL HIGH (ref 4.8–5.6)
MEAN PLASMA GLUCOSE: 116.89 mg/dL

## 2018-06-19 LAB — SURGICAL PCR SCREEN
MRSA, PCR: NEGATIVE
Staphylococcus aureus: POSITIVE — AB

## 2018-06-19 LAB — APTT: aPTT: 34 seconds (ref 24–36)

## 2018-06-19 LAB — PROTIME-INR
INR: 1 (ref 0.8–1.2)
Prothrombin Time: 12.9 seconds (ref 11.4–15.2)

## 2018-06-19 NOTE — Progress Notes (Signed)
PCP:Dr. Billey Gosling  CARDIOLOGIST:none  INFO IN Epic: PT. Wears a cpap Clearance  04-29-18 in epic Dr. Quay Burow INFO ON CHART:  BLOOD THINNERS AND LAST DOSES:n/a ____________________________________  PATIENT SYMPTOMS AT TIME OF PREOP:none

## 2018-06-20 NOTE — Anesthesia Preprocedure Evaluation (Addendum)
Anesthesia Evaluation  Patient identified by MRN, date of birth, ID band Patient awake    Reviewed: Allergy & Precautions, NPO status , Patient's Chart, lab work & pertinent test results  Airway Mallampati: II  TM Distance: >3 FB Neck ROM: Full    Dental no notable dental hx.    Pulmonary sleep apnea and Continuous Positive Airway Pressure Ventilation , former smoker,    Pulmonary exam normal breath sounds clear to auscultation       Cardiovascular negative cardio ROS Normal cardiovascular exam Rhythm:Regular Rate:Normal     Neuro/Psych negative neurological ROS  negative psych ROS   GI/Hepatic negative GI ROS, Neg liver ROS,   Endo/Other  Hypothyroidism   Renal/GU negative Renal ROS  negative genitourinary   Musculoskeletal negative musculoskeletal ROS (+)   Abdominal   Peds negative pediatric ROS (+)  Hematology negative hematology ROS (+)   Anesthesia Other Findings   Reproductive/Obstetrics negative OB ROS                            Anesthesia Physical Anesthesia Plan  ASA: II  Anesthesia Plan: Spinal   Post-op Pain Management:  [facility] for Post-op pain   Induction:   PONV Risk Score and Plan: 1  Airway Management Planned: Simple Face Mask  Additional Equipment:   Intra-op Plan:   Post-operative Plan:   Informed Consent: I have reviewed the patients History and Physical, chart, labs and discussed the procedure including the risks, benefits and alternatives for the proposed anesthesia with the patient or authorized representative who has indicated his/her understanding and acceptance.     Dental advisory given  Plan Discussed with: CRNA  Anesthesia Plan Comments: (See PAT note 06/19/18, Konrad Felix, PA-C)       Anesthesia Quick Evaluation
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
## 2018-06-21 ENCOUNTER — Telehealth: Payer: Self-pay | Admitting: Pulmonary Disease

## 2018-06-21 DIAGNOSIS — G4733 Obstructive sleep apnea (adult) (pediatric): Secondary | ICD-10-CM

## 2018-06-21 NOTE — Telephone Encounter (Signed)
Dr. Ander Slade has reviewed the home sleep test this showed Severe obstructive sleep apnea .   Recommendations  Currently on cpap  Treatment options are CPAP with the settings auto 5 to 15.    Weight loss measures .   Advise against driving while sleepy & against medication with sedative side effects.    Make appointment for 3 months for compliance with download with Dr. Ander Slade.  Patient is aware and wanted to get a new cpap with [facility]

## 2018-06-23 MED ORDER — BUPIVACAINE LIPOSOME 1.3 % IJ SUSP
20.0000 mL | INTRAMUSCULAR | Status: DC
  Filled 2018-06-23: qty 20

## 2018-06-24 ENCOUNTER — Inpatient Hospital Stay (HOSPITAL_COMMUNITY): Payer: Medicare Other | Admitting: Anesthesiology

## 2018-06-24 ENCOUNTER — Other Ambulatory Visit: Payer: Self-pay

## 2018-06-24 ENCOUNTER — Encounter (HOSPITAL_COMMUNITY): Payer: Self-pay | Admitting: *Deleted

## 2018-06-24 ENCOUNTER — Inpatient Hospital Stay (HOSPITAL_COMMUNITY): Payer: Medicare Other | Admitting: Physician Assistant

## 2018-06-24 ENCOUNTER — Inpatient Hospital Stay (HOSPITAL_COMMUNITY)
Admission: RE | Admit: 2018-06-24 | Discharge: 2018-06-25 | DRG: 470 | Disposition: A | Discharge status: Home / Self Care | Payer: Medicare Other | Attending: Orthopedic Surgery | Admitting: Orthopedic Surgery

## 2018-06-24 ENCOUNTER — Encounter (HOSPITAL_COMMUNITY)
Admission: RE | Disposition: A | Discharge status: Home / Self Care | Payer: Self-pay | Source: Home / Self Care | Attending: Orthopedic Surgery

## 2018-06-24 DIAGNOSIS — E781 Pure hyperglyceridemia: Secondary | ICD-10-CM | POA: Diagnosis present

## 2018-06-24 DIAGNOSIS — E669 Obesity, unspecified: Secondary | ICD-10-CM | POA: Diagnosis present

## 2018-06-24 DIAGNOSIS — Z96651 Presence of right artificial knee joint: Secondary | ICD-10-CM | POA: Diagnosis present

## 2018-06-24 DIAGNOSIS — Z832 Family history of diseases of the blood and blood-forming organs and certain disorders involving the immune mechanism: Secondary | ICD-10-CM

## 2018-06-24 DIAGNOSIS — K5790 Diverticulosis of intestine, part unspecified, without perforation or abscess without bleeding: Secondary | ICD-10-CM | POA: Diagnosis present

## 2018-06-24 DIAGNOSIS — Z809 Family history of malignant neoplasm, unspecified: Secondary | ICD-10-CM | POA: Diagnosis not present

## 2018-06-24 DIAGNOSIS — G8918 Other acute postprocedural pain: Secondary | ICD-10-CM | POA: Diagnosis not present

## 2018-06-24 DIAGNOSIS — Z833 Family history of diabetes mellitus: Secondary | ICD-10-CM

## 2018-06-24 DIAGNOSIS — G4730 Sleep apnea, unspecified: Secondary | ICD-10-CM | POA: Diagnosis present

## 2018-06-24 DIAGNOSIS — M25562 Pain in left knee: Secondary | ICD-10-CM | POA: Diagnosis not present

## 2018-06-24 DIAGNOSIS — M179 Osteoarthritis of knee, unspecified: Secondary | ICD-10-CM | POA: Diagnosis present

## 2018-06-24 DIAGNOSIS — M1710 Unilateral primary osteoarthritis, unspecified knee: Secondary | ICD-10-CM | POA: Diagnosis present

## 2018-06-24 DIAGNOSIS — Z79890 Hormone replacement therapy (postmenopausal): Secondary | ICD-10-CM

## 2018-06-24 DIAGNOSIS — E8881 Metabolic syndrome: Secondary | ICD-10-CM | POA: Diagnosis not present

## 2018-06-24 DIAGNOSIS — Z6837 Body mass index (BMI) 37.0-37.9, adult: Secondary | ICD-10-CM

## 2018-06-24 DIAGNOSIS — Z8261 Family history of arthritis: Secondary | ICD-10-CM

## 2018-06-24 DIAGNOSIS — Z87891 Personal history of nicotine dependence: Secondary | ICD-10-CM | POA: Diagnosis not present

## 2018-06-24 DIAGNOSIS — E039 Hypothyroidism, unspecified: Secondary | ICD-10-CM | POA: Diagnosis present

## 2018-06-24 DIAGNOSIS — M1712 Unilateral primary osteoarthritis, left knee: Secondary | ICD-10-CM | POA: Diagnosis not present

## 2018-06-24 HISTORY — PX: TOTAL KNEE ARTHROPLASTY: SHX125

## 2018-06-24 LAB — TYPE AND SCREEN
ABO/RH(D): O POS
ANTIBODY SCREEN: POSITIVE
Unit division: 0
Unit division: 0

## 2018-06-24 LAB — BPAM RBC
Blood Product Expiration Date: 202003312359
Blood Product Expiration Date: 202003312359
Unit Type and Rh: 5100
Unit Type and Rh: 5100

## 2018-06-24 SURGERY — ARTHROPLASTY, KNEE, TOTAL
Anesthesia: Spinal | Site: Knee | Laterality: Left

## 2018-06-24 MED ORDER — PROPOFOL 10 MG/ML IV BOLUS
INTRAVENOUS | Status: AC
  Filled 2018-06-24: qty 20

## 2018-06-24 MED ORDER — POLYETHYLENE GLYCOL 3350 17 G PO PACK: 17.0000 g | PACK | Freq: Every day | ORAL | Status: DC | PRN

## 2018-06-24 MED ORDER — FENTANYL CITRATE (PF) 100 MCG/2ML IJ SOLN
INTRAMUSCULAR | Status: AC
  Administered 2018-06-24: 100 ug via INTRAVENOUS
  Filled 2018-06-24: qty 2

## 2018-06-24 MED ORDER — ONDANSETRON HCL 4 MG PO TABS: 4.0000 mg | ORAL_TABLET | Freq: Four times a day (QID) | ORAL | Status: DC | PRN

## 2018-06-24 MED ORDER — DOCUSATE SODIUM 100 MG PO CAPS
100.0000 mg | ORAL_CAPSULE | Freq: Two times a day (BID) | ORAL | Status: DC
  Administered 2018-06-24 – 2018-06-25 (×2): 100 mg via ORAL
  Filled 2018-06-24 (×2): qty 1

## 2018-06-24 MED ORDER — MIDAZOLAM HCL 2 MG/2ML IJ SOLN
INTRAMUSCULAR | Status: AC
  Filled 2018-06-24: qty 2

## 2018-06-24 MED ORDER — DIPHENHYDRAMINE HCL 12.5 MG/5ML PO ELIX: 12.5000 mg | ORAL_SOLUTION | ORAL | Status: DC | PRN

## 2018-06-24 MED ORDER — METOCLOPRAMIDE HCL 5 MG/ML IJ SOLN: 10.0000 mg | Freq: Once | INTRAMUSCULAR | Status: DC | PRN

## 2018-06-24 MED ORDER — SODIUM CHLORIDE 0.9 % IV SOLN
INTRAVENOUS | Status: DC
  Administered 2018-06-24: 23:00:00 via INTRAVENOUS
  Administered 2018-06-24: 1000 mL via INTRAVENOUS

## 2018-06-24 MED ORDER — PHENYLEPHRINE HCL 10 MG/ML IJ SOLN
INTRAMUSCULAR | Status: AC
  Filled 2018-06-24: qty 1

## 2018-06-24 MED ORDER — CEFAZOLIN SODIUM-DEXTROSE 2-4 GM/100ML-% IV SOLN
2.0000 g | Freq: Four times a day (QID) | INTRAVENOUS | Status: AC
  Administered 2018-06-24 – 2018-06-25 (×2): 2 g via INTRAVENOUS
  Filled 2018-06-24 (×2): qty 100

## 2018-06-24 MED ORDER — ONDANSETRON HCL 4 MG/2ML IJ SOLN: 4.0000 mg | Freq: Four times a day (QID) | INTRAMUSCULAR | Status: DC | PRN

## 2018-06-24 MED ORDER — SODIUM CHLORIDE (PF) 0.9 % IJ SOLN
INTRAMUSCULAR | Status: AC
  Filled 2018-06-24: qty 50

## 2018-06-24 MED ORDER — SODIUM CHLORIDE (PF) 0.9 % IJ SOLN
INTRAMUSCULAR | Status: DC | PRN
  Administered 2018-06-24: 60 mL

## 2018-06-24 MED ORDER — ONDANSETRON HCL 4 MG/2ML IJ SOLN
INTRAMUSCULAR | Status: DC | PRN
  Administered 2018-06-24: 4 mg via INTRAVENOUS

## 2018-06-24 MED ORDER — MORPHINE SULFATE (PF) 2 MG/ML IV SOLN
1.0000 mg | INTRAVENOUS | Status: DC | PRN
  Administered 2018-06-24: 1 mg via INTRAVENOUS
  Filled 2018-06-24: qty 1

## 2018-06-24 MED ORDER — ONDANSETRON HCL 4 MG/2ML IJ SOLN
INTRAMUSCULAR | Status: AC
  Filled 2018-06-24: qty 2

## 2018-06-24 MED ORDER — MIDAZOLAM HCL 2 MG/2ML IJ SOLN: 1.0000 mg | INTRAMUSCULAR | Status: DC

## 2018-06-24 MED ORDER — TRANEXAMIC ACID-NACL 1000-0.7 MG/100ML-% IV SOLN
1000.0000 mg | Freq: Once | INTRAVENOUS | Status: AC
  Administered 2018-06-24: 1000 mg via INTRAVENOUS
  Filled 2018-06-24: qty 100

## 2018-06-24 MED ORDER — ASPIRIN EC 325 MG PO TBEC
325.0000 mg | DELAYED_RELEASE_TABLET | Freq: Two times a day (BID) | ORAL | Status: DC
  Administered 2018-06-25: 325 mg via ORAL
  Filled 2018-06-24: qty 1

## 2018-06-24 MED ORDER — SODIUM CHLORIDE (PF) 0.9 % IJ SOLN
INTRAMUSCULAR | Status: AC
  Filled 2018-06-24: qty 20

## 2018-06-24 MED ORDER — CEFAZOLIN SODIUM-DEXTROSE 2-4 GM/100ML-% IV SOLN
2.0000 g | INTRAVENOUS | Status: AC
  Administered 2018-06-24: 2 g via INTRAVENOUS
  Filled 2018-06-24: qty 100

## 2018-06-24 MED ORDER — METOCLOPRAMIDE HCL 5 MG/ML IJ SOLN: 5.0000 mg | Freq: Three times a day (TID) | INTRAMUSCULAR | Status: DC | PRN

## 2018-06-24 MED ORDER — ACETAMINOPHEN 10 MG/ML IV SOLN
1000.0000 mg | Freq: Four times a day (QID) | INTRAVENOUS | Status: DC
  Administered 2018-06-24: 1000 mg via INTRAVENOUS
  Filled 2018-06-24: qty 100

## 2018-06-24 MED ORDER — PROPOFOL 10 MG/ML IV BOLUS
INTRAVENOUS | Status: AC
  Filled 2018-06-24: qty 60

## 2018-06-24 MED ORDER — SODIUM CHLORIDE 0.9 % IR SOLN
Status: DC | PRN
  Administered 2018-06-24: 1000 mL

## 2018-06-24 MED ORDER — BISACODYL 10 MG RE SUPP: 10.0000 mg | Freq: Every day | RECTAL | Status: DC | PRN

## 2018-06-24 MED ORDER — TRANEXAMIC ACID-NACL 1000-0.7 MG/100ML-% IV SOLN
INTRAVENOUS | Status: AC
  Filled 2018-06-24: qty 100

## 2018-06-24 MED ORDER — ROPIVACAINE HCL 7.5 MG/ML IJ SOLN
INTRAMUSCULAR | Status: DC | PRN
  Administered 2018-06-24: 20 mL via PERINEURAL

## 2018-06-24 MED ORDER — DEXAMETHASONE SODIUM PHOSPHATE 10 MG/ML IJ SOLN
8.0000 mg | Freq: Once | INTRAMUSCULAR | Status: AC
  Administered 2018-06-24: 8 mg via INTRAVENOUS

## 2018-06-24 MED ORDER — FENTANYL CITRATE (PF) 100 MCG/2ML IJ SOLN
50.0000 ug | INTRAMUSCULAR | Status: DC
  Administered 2018-06-24: 100 ug via INTRAVENOUS

## 2018-06-24 MED ORDER — OXYCODONE HCL 5 MG PO TABS
5.0000 mg | ORAL_TABLET | ORAL | Status: DC | PRN
  Administered 2018-06-24: 5 mg via ORAL
  Administered 2018-06-24 – 2018-06-25 (×4): 10 mg via ORAL
  Filled 2018-06-24: qty 1
  Filled 2018-06-24 (×5): qty 2

## 2018-06-24 MED ORDER — PROPOFOL 500 MG/50ML IV EMUL
INTRAVENOUS | Status: DC | PRN
  Administered 2018-06-24: 100 ug/kg/min via INTRAVENOUS

## 2018-06-24 MED ORDER — DEXAMETHASONE SODIUM PHOSPHATE 10 MG/ML IJ SOLN
INTRAMUSCULAR | Status: AC
  Filled 2018-06-24: qty 1

## 2018-06-24 MED ORDER — LEVOTHYROXINE SODIUM 100 MCG PO TABS
200.0000 ug | ORAL_TABLET | Freq: Every day | ORAL | Status: DC
  Administered 2018-06-25: 200 ug via ORAL
  Filled 2018-06-24: qty 2

## 2018-06-24 MED ORDER — SODIUM CHLORIDE 0.9 % IV SOLN: 30.0000 ug/min | INTRAVENOUS | Status: DC

## 2018-06-24 MED ORDER — BUPIVACAINE LIPOSOME 1.3 % IJ SUSP
INTRAMUSCULAR | Status: DC | PRN
  Administered 2018-06-24: 20 mL

## 2018-06-24 MED ORDER — METHOCARBAMOL 500 MG PO TABS
500.0000 mg | ORAL_TABLET | Freq: Four times a day (QID) | ORAL | Status: DC | PRN
  Administered 2018-06-24 – 2018-06-25 (×2): 500 mg via ORAL
  Filled 2018-06-24 (×4): qty 1

## 2018-06-24 MED ORDER — METHOCARBAMOL 500 MG IVPB - SIMPLE MED
500.0000 mg | Freq: Four times a day (QID) | INTRAVENOUS | Status: DC | PRN
  Filled 2018-06-24: qty 50

## 2018-06-24 MED ORDER — MEPERIDINE HCL 50 MG/ML IJ SOLN: 6.2500 mg | INTRAMUSCULAR | Status: DC | PRN

## 2018-06-24 MED ORDER — GABAPENTIN 300 MG PO CAPS
300.0000 mg | ORAL_CAPSULE | Freq: Three times a day (TID) | ORAL | Status: DC
  Administered 2018-06-24 – 2018-06-25 (×2): 300 mg via ORAL
  Filled 2018-06-24 (×2): qty 1

## 2018-06-24 MED ORDER — MENTHOL 3 MG MT LOZG: 1.0000 | LOZENGE | OROMUCOSAL | Status: DC | PRN

## 2018-06-24 MED ORDER — ACETAMINOPHEN 500 MG PO TABS
1000.0000 mg | ORAL_TABLET | Freq: Four times a day (QID) | ORAL | Status: AC
  Administered 2018-06-24 – 2018-06-25 (×4): 1000 mg via ORAL
  Filled 2018-06-24 (×4): qty 2

## 2018-06-24 MED ORDER — METOCLOPRAMIDE HCL 5 MG PO TABS: 5.0000 mg | ORAL_TABLET | Freq: Three times a day (TID) | ORAL | Status: DC | PRN

## 2018-06-24 MED ORDER — SODIUM CHLORIDE 0.9 % IV SOLN
INTRAVENOUS | Status: DC | PRN
  Administered 2018-06-24: 40 ug/min via INTRAVENOUS

## 2018-06-24 MED ORDER — TRAMADOL HCL 50 MG PO TABS
50.0000 mg | ORAL_TABLET | Freq: Four times a day (QID) | ORAL | Status: DC | PRN
  Administered 2018-06-25 (×2): 100 mg via ORAL
  Filled 2018-06-24 (×2): qty 2

## 2018-06-24 MED ORDER — PHENOL 1.4 % MT LIQD
1.0000 | OROMUCOSAL | Status: DC | PRN
  Filled 2018-06-24: qty 177

## 2018-06-24 MED ORDER — FENTANYL CITRATE (PF) 100 MCG/2ML IJ SOLN: 25.0000 ug | INTRAMUSCULAR | Status: DC | PRN

## 2018-06-24 MED ORDER — STERILE WATER FOR IRRIGATION IR SOLN
Status: DC | PRN
  Administered 2018-06-24: 2000 mL

## 2018-06-24 MED ORDER — TRANEXAMIC ACID-NACL 1000-0.7 MG/100ML-% IV SOLN
1000.0000 mg | INTRAVENOUS | Status: AC
  Administered 2018-06-24: 1000 mg via INTRAVENOUS

## 2018-06-24 MED ORDER — BUPIVACAINE IN DEXTROSE 0.75-8.25 % IT SOLN
INTRATHECAL | Status: DC | PRN
  Administered 2018-06-24: 1.8 mL via INTRATHECAL

## 2018-06-24 MED ORDER — CHLORHEXIDINE GLUCONATE 4 % EX LIQD: 60.0000 mL | Freq: Once | CUTANEOUS | Status: DC

## 2018-06-24 MED ORDER — DEXAMETHASONE SODIUM PHOSPHATE 10 MG/ML IJ SOLN
10.0000 mg | Freq: Once | INTRAMUSCULAR | Status: AC
  Administered 2018-06-25: 10 mg via INTRAVENOUS
  Filled 2018-06-24: qty 1

## 2018-06-24 MED ORDER — LACTATED RINGERS IV SOLN
INTRAVENOUS | Status: DC
  Administered 2018-06-24 (×2): via INTRAVENOUS

## 2018-06-24 MED ORDER — FLEET ENEMA 7-19 GM/118ML RE ENEM: 1.0000 | ENEMA | Freq: Once | RECTAL | Status: DC | PRN

## 2018-06-24 SURGICAL SUPPLY — 65 items
ATTUNE MED DOME PAT 41 KNEE (Knees) ×2 IMPLANT
ATTUNE MED DOME PAT 41MM KNEE (Knees) ×1 IMPLANT
ATTUNE PS FEM LT SZ 8 CEM KNEE (Femur) ×3 IMPLANT
ATTUNE PSRP INSR SZ8 8 KNEE (Insert) ×2 IMPLANT
ATTUNE PSRP INSR SZ8 8MM KNEE (Insert) ×1 IMPLANT
BAG ZIPLOCK 12X15 (MISCELLANEOUS) ×3 IMPLANT
BANDAGE ACE 6X5 VEL STRL LF (GAUZE/BANDAGES/DRESSINGS) ×3 IMPLANT
BASE TIBIAL ROT PLAT SZ 7 KNEE (Knees) ×1 IMPLANT
BLADE SAG 18X100X1.27 (BLADE) ×3 IMPLANT
BLADE SAW SGTL 11.0X1.19X90.0M (BLADE) ×3 IMPLANT
BLADE SURG SZ10 CARB STEEL (BLADE) IMPLANT
BOWL SMART MIX CTS (DISPOSABLE) ×3 IMPLANT
CEMENT HV SMART SET (Cement) ×6 IMPLANT
CHLORAPREP W/TINT 26 (MISCELLANEOUS) ×3 IMPLANT
CLOSURE WOUND 1/2 X4 (GAUZE/BANDAGES/DRESSINGS) ×2
COVER SURGICAL LIGHT HANDLE (MISCELLANEOUS) ×3 IMPLANT
COVER WAND RF STERILE (DRAPES) IMPLANT
CUFF TOURN SGL QUICK 34 (TOURNIQUET CUFF) ×2
CUFF TRNQT CYL 34X4.125X (TOURNIQUET CUFF) ×1 IMPLANT
DECANTER SPIKE VIAL GLASS SM (MISCELLANEOUS) ×6 IMPLANT
DRAPE U-SHAPE 47X51 STRL (DRAPES) ×3 IMPLANT
DRSG ADAPTIC 3X8 NADH LF (GAUZE/BANDAGES/DRESSINGS) ×3 IMPLANT
DRSG PAD ABDOMINAL 8X10 ST (GAUZE/BANDAGES/DRESSINGS) ×3 IMPLANT
DURAPREP 26ML APPLICATOR (WOUND CARE) IMPLANT
ELECT REM PT RETURN 15FT ADLT (MISCELLANEOUS) ×3 IMPLANT
EVACUATOR 1/8 PVC DRAIN (DRAIN) ×3 IMPLANT
GAUZE SPONGE 4X4 12PLY STRL (GAUZE/BANDAGES/DRESSINGS) ×3 IMPLANT
GLOVE BIO SURGEON STRL SZ7 (GLOVE) IMPLANT
GLOVE BIO SURGEON STRL SZ8 (GLOVE) ×3 IMPLANT
GLOVE BIOGEL PI IND STRL 6.5 (GLOVE) ×1 IMPLANT
GLOVE BIOGEL PI IND STRL 7.0 (GLOVE) ×1 IMPLANT
GLOVE BIOGEL PI IND STRL 7.5 (GLOVE) ×3 IMPLANT
GLOVE BIOGEL PI IND STRL 8 (GLOVE) ×1 IMPLANT
GLOVE BIOGEL PI INDICATOR 6.5 (GLOVE) ×2
GLOVE BIOGEL PI INDICATOR 7.0 (GLOVE) ×2
GLOVE BIOGEL PI INDICATOR 7.5 (GLOVE) ×6
GLOVE BIOGEL PI INDICATOR 8 (GLOVE) ×2
GLOVE SURG SS PI 6.5 STRL IVOR (GLOVE) ×6 IMPLANT
GLOVE SURG SS PI 7.0 STRL IVOR (GLOVE) ×3 IMPLANT
GOWN STRL REIN 2XL XLG LVL4 (GOWN DISPOSABLE) ×3 IMPLANT
GOWN STRL REUS W/TWL LRG LVL3 (GOWN DISPOSABLE) ×9 IMPLANT
HANDPIECE INTERPULSE COAX TIP (DISPOSABLE) ×2
HOLDER FOLEY CATH W/STRAP (MISCELLANEOUS) ×3 IMPLANT
IMMOBILIZER KNEE 20 (SOFTGOODS) ×3
IMMOBILIZER KNEE 20 THIGH 36 (SOFTGOODS) ×1 IMPLANT
KIT TURNOVER KIT A (KITS) IMPLANT
MANIFOLD NEPTUNE II (INSTRUMENTS) ×3 IMPLANT
NS IRRIG 1000ML POUR BTL (IV SOLUTION) ×3 IMPLANT
PACK TOTAL KNEE CUSTOM (KITS) ×3 IMPLANT
PADDING CAST COTTON 6X4 STRL (CAST SUPPLIES) ×6 IMPLANT
PIN STEINMAN FIXATION KNEE (PIN) ×3 IMPLANT
PIN THREADED HEADED SIGMA (PIN) ×3 IMPLANT
PROTECTOR NERVE ULNAR (MISCELLANEOUS) ×3 IMPLANT
SET HNDPC FAN SPRY TIP SCT (DISPOSABLE) ×1 IMPLANT
STRIP CLOSURE SKIN 1/2X4 (GAUZE/BANDAGES/DRESSINGS) ×4 IMPLANT
SUT MNCRL AB 4-0 PS2 18 (SUTURE) ×3 IMPLANT
SUT STRATAFIX 0 PDS 27 VIOLET (SUTURE) ×3
SUT VIC AB 2-0 CT1 27 (SUTURE) ×6
SUT VIC AB 2-0 CT1 TAPERPNT 27 (SUTURE) ×3 IMPLANT
SUTURE STRATFX 0 PDS 27 VIOLET (SUTURE) ×1 IMPLANT
TIBIAL BASE ROT PLAT SZ 7 KNEE (Knees) ×3 IMPLANT
TRAY FOLEY MTR SLVR 16FR STAT (SET/KITS/TRAYS/PACK) ×3 IMPLANT
WATER STERILE IRR 1000ML POUR (IV SOLUTION) ×6 IMPLANT
WRAP KNEE MAXI GEL POST OP (GAUZE/BANDAGES/DRESSINGS) ×3 IMPLANT
YANKAUER SUCT BULB TIP 10FT TU (MISCELLANEOUS) ×3 IMPLANT

## 2018-06-24 NOTE — Interval H&P Note (Signed)
History and Physical Interval Note:  06/24/2018 10:12 AM  Dalton Murphy  has presented today for surgery, with the diagnosis of left knee osteoarthritis.  The various methods of treatment have been discussed with the patient and family. After consideration of risks, benefits and other options for treatment, the patient has consented to  Procedure(s) with comments: TOTAL KNEE ARTHROPLASTY (Left) - 52min as a surgical intervention.  The patient's history has been reviewed, patient examined, no change in status, stable for surgery.  I have reviewed the patient's chart and labs.  Questions were answered to the patient's satisfaction.     Pilar Plate Ledford Goodson

## 2018-06-24 NOTE — Progress Notes (Signed)
Assisted Dr. Marcell Barlow with left, ultrasound guided, adductor canal block. Side rails up, monitors on throughout procedure. See vital signs in flow sheet. Tolerated Procedure well.

## 2018-06-24 NOTE — Plan of Care (Signed)
Plan of care 

## 2018-06-24 NOTE — Anesthesia Procedure Notes (Addendum)
Spinal  Patient location during procedure: OR Start time: 06/24/2018 12:35 PM End time: 06/24/2018 12:42 PM Reason for block: at surgeon's request Staffing Anesthesiologist: Montez Hageman, MD Resident/CRNA: Anne Fu, CRNA Performed: anesthesiologist  Preanesthetic Checklist Completed: patient identified, site marked, surgical consent, pre-op evaluation, timeout performed, IV checked, risks and benefits discussed and monitors and equipment checked Spinal Block Patient position: sitting Prep: Betadine and DuraPrep Patient monitoring: heart rate, continuous pulse ox and blood pressure Approach: right paramedian Location: L2-3 Injection technique: single-shot Needle Needle type: Sprotte  Needle gauge: 24 G Needle length: 9 cm Assessment Sensory level: T6 Additional Notes Expiration date of kit checked and confirmed. Patient tolerated procedure well, without complications. First attempt L2-L3 per CRNA without success.  MD Carignan X 1 at L2-L3 with noted clear return of CSF aspiration without difficulty.  Noted loss of motor response to lower ext.

## 2018-06-24 NOTE — Care Plan (Signed)
Ortho Bundle Case Management Note  Patient Details  Name: Dalton Murphy MRN: [id number] Date of Birth: Sep 15, 1950  L TKA scheduled on 06-24-2018 DCP:  Home with spouse. 1 story home with 3 ste. DME:  No needs.  Has a RW and 3-in-1. PT:  [facility].  PT eval scheduled on 06-28-2018.                   DME Arranged:  N/A DME Agency:  NA  HH Arranged:  NA HH Agency:  NA  Additional Comments: Please contact me with any questions of if this plan should need to change.  Marianne Sofia, RN,CCM [facility]  [phone] 06/24/2018, 10:25 AM

## 2018-06-24 NOTE — Discharge Instructions (Signed)
° °Dr. Frank Aluisio °Total Joint Specialist °[facility] °[address] °[place], [place] [address] °[phone] ° °TOTAL KNEE REPLACEMENT POSTOPERATIVE DIRECTIONS ° °Knee Rehabilitation, Guidelines Following Surgery  °Results after knee surgery are often greatly improved when you follow the exercise, range of motion and muscle strengthening exercises prescribed by your doctor. Safety measures are also important to protect the knee from further injury. Any time any of these exercises cause you to have increased pain or swelling in your knee joint, decrease the amount until you are comfortable again and slowly increase them. If you have problems or questions, call your caregiver or physical therapist for advice.  ° °HOME CARE INSTRUCTIONS  °• Remove items at home which could result in a fall. This includes throw rugs or furniture in walking pathways.  °· ICE to the affected knee every three hours for 30 minutes at a time and then as needed for pain and swelling.  Continue to use ice on the knee for pain and swelling from surgery. You may notice swelling that will progress down to the foot and ankle.  This is normal after surgery.  Elevate the leg when you are not up walking on it.   °· Continue to use the breathing machine which will help keep your temperature down.  It is common for your temperature to cycle up and down following surgery, especially at night when you are not up moving around and exerting yourself.  The breathing machine keeps your lungs expanded and your temperature down. °· Do not place pillow under knee, focus on keeping the knee straight while resting ° °DIET °You may resume your previous home diet once your are discharged from the hospital. ° °DRESSING / WOUND CARE / SHOWERING °You may change your dressing 3-5 days after surgery.  Then change the dressing every day with sterile gauze.  Please use good hand washing techniques before changing the dressing.  Do not use any lotions  or creams on the incision until instructed by your surgeon. °You may start showering once you are discharged home but do not submerge the incision under water. Just pat the incision dry and apply a dry gauze dressing on daily. °Change the surgical dressing daily and reapply a dry dressing each time. ° °ACTIVITY °Walk with your walker as instructed. °Use walker as long as suggested by your caregivers. °Avoid periods of inactivity such as sitting longer than an hour when not asleep. This helps prevent blood clots.  °You may resume a sexual relationship in one month or when given the OK by your doctor.  °You may return to work once you are cleared by your doctor.  °Do not drive a car for 6 weeks or until released by you surgeon.  °Do not drive while taking narcotics. ° °WEIGHT BEARING °Weight bearing as tolerated with assist device (walker, cane, etc) as directed, use it as long as suggested by your surgeon or therapist, typically at least 4-6 weeks. ° °POSTOPERATIVE CONSTIPATION PROTOCOL °Constipation - defined medically as fewer than three stools per week and severe constipation as less than one stool per week. ° °One of the most common issues patients have following surgery is constipation.  Even if you have a regular bowel pattern at home, your normal regimen is likely to be disrupted due to multiple reasons following surgery.  Combination of anesthesia, postoperative narcotics, change in appetite and fluid intake all can affect your bowels.  In order to avoid complications following surgery, here are some   recommendations in order to help you during your recovery period. ° °Colace (docusate) - Pick up an over-the-counter form of Colace or another stool softener and take twice a day as long as you are requiring postoperative pain medications.  Take with a full glass of water daily.  If you experience loose stools or diarrhea, hold the colace until you stool forms back up.  If your symptoms do not get better within 1  week or if they get worse, check with your doctor. ° °Dulcolax (bisacodyl) - Pick up over-the-counter and take as directed by the product packaging as needed to assist with the movement of your bowels.  Take with a full glass of water.  Use this product as needed if not relieved by Colace only.  ° °MiraLax (polyethylene glycol) - Pick up over-the-counter to have on hand.  MiraLax is a solution that will increase the amount of water in your bowels to assist with bowel movements.  Take as directed and can mix with a glass of water, juice, soda, coffee, or tea.  Take if you go more than two days without a movement. °Do not use MiraLax more than once per day. Call your doctor if you are still constipated or irregular after using this medication for 7 days in a row. ° °If you continue to have problems with postoperative constipation, please contact the office for further assistance and recommendations.  If you experience "the worst abdominal pain ever" or develop nausea or vomiting, please contact the office immediatly for further recommendations for treatment. ° °ITCHING °If you experience itching with your medications, try taking only a single pain pill, or even half a pain pill at a time.  You can also use Benadryl over the counter for itching or also to help with sleep.  ° °TED HOSE STOCKINGS °Wear the elastic stockings on both legs for three weeks following surgery during the day but you may remove then at night for sleeping. ° °MEDICATIONS °See your medication summary on the “After Visit Summary” that the nursing staff will review with you prior to discharge.  You may have some home medications which will be placed on hold until you complete the course of blood thinner medication.  It is important for you to complete the blood thinner medication as prescribed by your surgeon.  Continue your approved medications as instructed at time of discharge. ° °PRECAUTIONS °If you experience chest pain or shortness of breath -  call 911 immediately for transfer to the hospital emergency department.  °If you develop a fever greater that 101 F, purulent drainage from wound, increased redness or drainage from wound, foul odor from the wound/dressing, or calf pain - CONTACT YOUR SURGEON.   °                                                °FOLLOW-UP APPOINTMENTS °Make sure you keep all of your appointments after your operation with your surgeon and caregivers. You should call the office at the above phone number and make an appointment for approximately two weeks after the date of your surgery or on the date instructed by your surgeon outlined in the "After Visit Summary". ° °RANGE OF MOTION AND STRENGTHENING EXERCISES  °Rehabilitation of the knee is important following a knee injury or an operation. After just a few days of immobilization, the muscles of   the thigh which control the knee become weakened and shrink (atrophy). Knee exercises are designed to build up the tone and strength of the thigh muscles and to improve knee motion. Often times heat used for twenty to thirty minutes before working out will loosen up your tissues and help with improving the range of motion but do not use heat for the first two weeks following surgery. These exercises can be done on a training (exercise) mat, on the floor, on a table or on a bed. Use what ever works the best and is most comfortable for you Knee exercises include:  °• Leg Lifts - While your knee is still immobilized in a splint or cast, you can do straight leg raises. Lift the leg to 60 degrees, hold for 3 sec, and slowly lower the leg. Repeat 10-20 times 2-3 times daily. Perform this exercise against resistance later as your knee gets better.  °• Quad and Hamstring Sets - Tighten up the muscle on the front of the thigh (Quad) and hold for 5-10 sec. Repeat this 10-20 times hourly. Hamstring sets are done by pushing the foot backward against an object and holding for 5-10 sec. Repeat as with quad  sets.  °· Leg Slides: Lying on your back, slowly slide your foot toward your buttocks, bending your knee up off the floor (only go as far as is comfortable). Then slowly slide your foot back down until your leg is flat on the floor again. °· Angel Wings: Lying on your back spread your legs to the side as far apart as you can without causing discomfort.  °A rehabilitation program following serious knee injuries can speed recovery and prevent re-injury in the future due to weakened muscles. Contact your doctor or a physical therapist for more information on knee rehabilitation.  ° °IF YOU ARE TRANSFERRED TO A SKILLED REHAB FACILITY °If the patient is transferred to a skilled rehab facility following release from the hospital, a list of the current medications will be sent to the facility for the patient to continue.  When discharged from the skilled rehab facility, please have the facility set up the patient's Home Health Physical Therapy prior to being released. Also, the skilled facility will be responsible for providing the patient with their medications at time of release from the facility to include their pain medication, the muscle relaxants, and their blood thinner medication. If the patient is still at the rehab facility at time of the two week follow up appointment, the skilled rehab facility will also need to assist the patient in arranging follow up appointment in our office and any transportation needs. ° °MAKE SURE YOU:  °• Understand these instructions.  °• Get help right away if you are not doing well or get worse.  ° ° °Pick up stool softner and laxative for home use following surgery while on pain medications. °Do not submerge incision under water. °Please use good hand washing techniques while changing dressing each day. °May shower starting three days after surgery. °Please use a clean towel to pat the incision dry following showers. °Continue to use ice for pain and swelling after surgery. °Do not  use any lotions or creams on the incision until instructed by your surgeon. ° °

## 2018-06-24 NOTE — Transfer of Care (Signed)
Immediate Anesthesia Transfer of Care Note  Patient: Dalton Murphy [facility]  Procedure(s) Performed: Procedure(s) with comments: TOTAL KNEE ARTHROPLASTY (Left) - 36min  Patient Location: PACU  Anesthesia Type:Spinal  Level of Consciousness:  sedated, patient cooperative and responds to stimulation  Airway & Oxygen Therapy:Patient Spontanous Breathing and Patient connected to face mask oxgen  Post-op Assessment:  Report given to PACU RN and Post -op Vital signs reviewed and stable  Post vital signs:  Reviewed and stable  Last Vitals:  Vitals:   06/24/18 1159 06/24/18 1205  BP: (!) 145/84 138/82  Pulse: 66 67  Resp: 15 11  Temp:    SpO2: 74% 73%    Complications: No apparent anesthesia complications

## 2018-06-24 NOTE — Anesthesia Procedure Notes (Signed)
Anesthesia Regional Block: Adductor canal block   Pre-Anesthetic Checklist: ,, timeout performed, Correct Patient, Correct Site, Correct Laterality, Correct Procedure, Correct Position, site marked, Risks and benefits discussed,  Surgical consent,  Pre-op evaluation,  At surgeon's request and post-op pain management  Laterality: Left and Lower  Prep: Maximum Sterile Barrier Precautions used, chloraprep       Needles:  Injection technique: Single-shot  Needle Type: Echogenic Stimulator Needle     Needle Length: 10cm      Additional Needles:   Procedures:,,,, ultrasound used (permanent image in chart),,,,  Narrative:  Start time: 06/24/2018 11:59 AM End time: 06/24/2018 12:04 PM Injection made incrementally with aspirations every 5 mL.  Performed by: Personally  Anesthesiologist: Montez Hageman, MD  Additional Notes: Risks, benefits and alternative to block explained extensively.  Patient tolerated procedure well, without complications.

## 2018-06-24 NOTE — Op Note (Signed)
OPERATIVE REPORT-TOTAL KNEE ARTHROPLASTY   Pre-operative diagnosis- Osteoarthritis  Left knee(s)  Post-operative diagnosis- Osteoarthritis Left knee(s)  Procedure-  Left  Total Knee Arthroplasty  Surgeon- Dione Plover. Khaleah Duer, MD  Assistant- Ardeen Jourdain, PA-C   Anesthesia-  Adductor canal block and spinal  EBL- 25 ml   Drains Hemovac  Tourniquet time-  Total Tourniquet Time Documented: Thigh (Left) - 39 minutes Total: Thigh (Left) - 39 minutes     Complications- None  Condition-PACU - hemodynamically stable.   Brief Clinical Note   Lotus Gover is a 68 y.o. year old male with end stage OA of his left knee with progressively worsening pain and dysfunction. He has constant pain, with activity and at rest and significant functional deficits with difficulties even with ADLs. He has had extensive non-op management including analgesics, injections of cortisone and viscosupplements, and home exercise program, but remains in significant pain with significant dysfunction. Radiographs show bone on bone arthritis medial and patellofemoral. He presents now for left Total Knee Arthroplasty.     Procedure in detail---   The patient is brought into the operating room and positioned supine on the operating table. After successful administration of  Adductor canal block and spinal,   a tourniquet is placed high on the  Left thigh(s) and the lower extremity is prepped and draped in the usual sterile fashion. Time out is performed by the operating team and then the  Left lower extremity is wrapped in Esmarch, knee flexed and the tourniquet inflated to 300 mmHg.       A midline incision is made with a ten blade through the subcutaneous tissue to the level of the extensor mechanism. A fresh blade is used to make a medial parapatellar arthrotomy. Soft tissue over the proximal medial tibia is subperiosteally elevated to the joint line with a knife and into the semimembranosus bursa with a  Cobb elevator. Soft tissue over the proximal lateral tibia is elevated with attention being paid to avoiding the patellar tendon on the tibial tubercle. The patella is everted, knee flexed 90 degrees and the ACL and PCL are removed. Findings are bone on bone medial and patellofemoral with massive global osteophytes and massive synovitis.        The drill is used to create a starting hole in the distal femur and the canal is thoroughly irrigated with sterile saline to remove the fatty contents. The 5 degree Left  valgus alignment guide is placed into the femoral canal and the distal femoral cutting block is pinned to remove 9 mm off the distal femur. Resection is made with an oscillating saw.      The tibia is subluxed forward and the menisci are removed. The extramedullary alignment guide is placed referencing proximally at the medial aspect of the tibial tubercle and distally along the second metatarsal axis and tibial crest. The block is pinned to remove 49mm off the more deficient medial  side. Resection is made with an oscillating saw. Size 7is the most appropriate size for the tibia and the proximal tibia is prepared with the modular drill and keel punch for that size.      The femoral sizing guide is placed and size 8 is most appropriate. Rotation is marked off the epicondylar axis and confirmed by creating a rectangular flexion gap at 90 degrees. The size 8 cutting block is pinned in this rotation and the anterior, posterior and chamfer cuts are made with the oscillating saw. The intercondylar block is then placed  and that cut is made.      Trial size 7 tibial component, trial size 8 posterior stabilized femur and a 8  mm posterior stabilized rotating platform insert trial is placed. Full extension is achieved with excellent varus/valgus and anterior/posterior balance throughout full range of motion. The patella is everted and thickness measured to be 27  mm. Free hand resection is taken to 15 mm, a 41  template is placed, lug holes are drilled, trial patella is placed, and it tracks normally. Osteophytes are removed off the posterior femur with the trial in place. All trials are removed and the cut bone surfaces prepared with pulsatile lavage. Cement is mixed and once ready for implantation, the size 7 tibial implant, size  8 posterior stabilized femoral component, and the size 41 patella are cemented in place and the patella is held with the clamp. The trial insert is placed and the knee held in full extension. The Exparel (20 ml mixed with 60 ml saline) is injected into the extensor mechanism, posterior capsule, medial and lateral gutters and subcutaneous tissues.  All extruded cement is removed and once the cement is hard the permanent 8 mm posterior stabilized rotating platform insert is placed into the tibial tray.      The wound is copiously irrigated with saline solution and the extensor mechanism closed over a hemovac drain with #1 V-loc suture. The tourniquet is released for a total tourniquet time of 39  minutes. Flexion against gravity is 140 degrees and the patella tracks normally. Subcutaneous tissue is closed with 2.0 vicryl and subcuticular with running 4.0 Monocryl. The incision is cleaned and dried and steri-strips and a bulky sterile dressing are applied. The limb is placed into a knee immobilizer and the patient is awakened and transported to recovery in stable condition.      Please note that a surgical assistant was a medical necessity for this procedure in order to perform it in a safe and expeditious manner. Surgical assistant was necessary to retract the ligaments and vital neurovascular structures to prevent injury to them and also necessary for proper positioning of the limb to allow for anatomic placement of the prosthesis.   Dione Plover Ladislaus Repsher, MD    06/24/2018, 1:44 PM

## 2018-06-24 NOTE — Evaluation (Signed)
Physical Therapy Evaluation Patient Details Name: Dalton Murphy MRN: [id number] DOB: 1950-08-20 Today's Date: 06/24/2018   History of Present Illness  68 yo male s/p L TKR on 06/24/18. PMH includes R TKR, gout, pre-DM, LBP, diverticulosis, dysmetabolic syndrome, HLD, sleep apnea.   Clinical Impression   Pt presents with L knee pain, decreased L knee ROM, difficulty performing bed mobility, increased time and effort to perform all mobility tasks, and decreased activity tolerance due to L knee pain. Pt to benefit from acute PT to address deficits. Pt ambulated 50 ft with RW with min guard assist, verbal cuing provided for form and safety throughout. Pt educated on ankle pumps (20/hour) to perform this afternoon/evening to increase circulation, to pt's tolerance and limited by pain. PT to progress mobility as tolerated, and will continue to follow acutely.        Follow Up Recommendations Follow surgeon's recommendation for DC plan and follow-up therapies;Supervision for mobility/OOB(OPPT - on 3/13)    Equipment Recommendations  None recommended by PT    Recommendations for Other Services       Precautions / Restrictions Precautions Precautions: Fall Required Braces or Orthoses: Knee Immobilizer - Left Knee Immobilizer - Left: On when out of bed or walking;Discontinue once straight leg raise with < 10 degree lag Restrictions Weight Bearing Restrictions: No      Mobility  Bed Mobility Overal bed mobility: Needs Assistance Bed Mobility: Supine to Sit     Supine to sit: Min assist;HOB elevated     General bed mobility comments: Min assist for LLE lifting and translation to EOB. Pt with use of bed rails to pull trunk to upright. Increased time and effort.   Transfers Overall transfer level: Needs assistance Equipment used: Rolling walker (2 wheeled) Transfers: Sit to/from Stand Sit to Stand: Min assist;From elevated surface         General transfer comment: Min assist  for power up, steadying upon standing. Verbal cuing for hand placement (one on bed and one on RW) during rising.   Ambulation/Gait Ambulation/Gait assistance: Min guard Gait Distance (Feet): 50 Feet Assistive device: Rolling walker (2 wheeled) Gait Pattern/deviations: Step-to pattern;Decreased stance time - left;Decreased weight shift to left;Antalgic;Trunk flexed Gait velocity: decr    General Gait Details: Min guard for safety. Verbal cuing for placement in RW, sequencing with step-to gait. Pt with decreased WB on LLE due to pain.   Stairs            Wheelchair Mobility    Modified Rankin (Stroke Patients Only)       Balance Overall balance assessment: Mild deficits observed, not formally tested                                           Pertinent Vitals/Pain Pain Assessment: 0-10 Pain Score: 5  Pain Location: L knee  Pain Descriptors / Indicators: Sore Pain Intervention(s): Limited activity within patient's tolerance;Repositioned;Monitored during session;Ice applied;Premedicated before session    Home Living Family/patient expects to be discharged to:: Private residence Living Arrangements: Spouse/significant other Available Help at Discharge: Family Type of Home: House Home Access: Stairs to enter Entrance Stairs-Rails: Psychiatric nurse of Steps: [place]: Two level;Able to live on main level with bedroom/bathroom Home Equipment: Gilford Rile - 2 wheels;Cane - single point;Shower seat - built in;Bedside commode      Prior Function Level of Independence: Independent with assistive  device(s)         Comments: works 8 hour shifts at [employer] Pt reports using RW for the past few days due to severe L knee pain.      Hand Dominance   Dominant Hand: Right    Extremity/Trunk Assessment   Upper Extremity Assessment Upper Extremity Assessment: Overall WFL for tasks assessed    Lower Extremity Assessment Lower Extremity  Assessment: Generalized weakness;LLE deficits/detail LLE Deficits / Details: suspected post-surgical weakness; able to perform ankle pumps, quad set, heel slides, SLR with lift assist LLE Sensation: WNL    Cervical / Trunk Assessment Cervical / Trunk Assessment: Normal  Communication   Communication: No difficulties(Pleasantly talkative)  Cognition Arousal/Alertness: Awake/alert Behavior During Therapy: WFL for tasks assessed/performed Overall Cognitive Status: Within Functional Limits for tasks assessed                                        General Comments      Exercises Total Joint Exercises Goniometric ROM: knee aarom ~10-45*, limited by pain and stiffness   Assessment/Plan    PT Assessment Patient needs continued PT services  PT Problem List Decreased strength;Pain;Decreased range of motion;Decreased activity tolerance;Decreased knowledge of use of DME;Decreased balance;Decreased mobility       PT Treatment Interventions DME instruction;Therapeutic activities;Gait training;Therapeutic exercise;Patient/family education;Balance training;Stair training;Functional mobility training    PT Goals (Current goals can be found in the Care Plan section)  Acute Rehab PT Goals Patient Stated Goal: decreased knee pain, return to work  PT Goal Formulation: With patient Time For Goal Achievement: 07/01/18 Potential to Achieve Goals: Good    Frequency 7X/week   Barriers to discharge        Co-evaluation               AM-PAC PT "6 Clicks" Mobility  Outcome Measure Help needed turning from your back to your side while in a flat bed without using bedrails?: A Little Help needed moving from lying on your back to sitting on the side of a flat bed without using bedrails?: A Little Help needed moving to and from a bed to a chair (including a wheelchair)?: A Little Help needed standing up from a chair using your arms (e.g., wheelchair or bedside chair)?: A  Little Help needed to walk in hospital room?: A Little Help needed climbing 3-5 steps with a railing? : A Lot 6 Click Score: 17    End of Session Equipment Utilized During Treatment: Gait belt;Left knee immobilizer Activity Tolerance: Patient limited by pain;Patient limited by fatigue Patient left: in chair;with chair alarm set;with call bell/phone within reach(on SCD break for shift change ) Nurse Communication: Mobility status PT Visit Diagnosis: Difficulty in walking, not elsewhere classified (R26.2);Other abnormalities of gait and mobility (R26.89)    Time: 1840-1910 PT Time Calculation (min) (ACUTE ONLY): 30 min   Charges:   PT Evaluation $PT Eval Low Complexity: 1 Low PT Treatments $Gait Training: 8-22 mins        Julien Girt, PT Acute Rehabilitation Services Pager [phone]  Office [phone]   Roxine Caddy D Elonda Husky 06/24/2018, 7:22 PM

## 2018-06-24 NOTE — Anesthesia Postprocedure Evaluation (Signed)
Anesthesia Post Note  Patient: Dalton Murphy  Procedure(s) Performed: TOTAL KNEE ARTHROPLASTY (Left Knee)     Patient location during evaluation: PACU Anesthesia Type: Spinal Level of consciousness: awake and alert Pain management: pain level controlled Vital Signs Assessment: post-procedure vital signs reviewed and stable Respiratory status: spontaneous breathing and respiratory function stable Cardiovascular status: blood pressure returned to baseline and stable Postop Assessment: no headache, no backache, spinal receding and no apparent nausea or vomiting Anesthetic complications: no    Last Vitals:  Vitals:   06/24/18 1551 06/24/18 1706  BP: 118/68 128/76  Pulse: (!) 58 61  Resp: 18   Temp: 36.6 C 37.1 C  SpO2: 100% 99%    Last Pain:  Vitals:   06/24/18 1600  TempSrc:   PainSc: 0-No pain                 Montez Hageman

## 2018-06-25 LAB — BASIC METABOLIC PANEL
Anion gap: 9 (ref 5–15)
BUN: 11 mg/dL (ref 8–23)
CALCIUM: 8.9 mg/dL (ref 8.9–10.3)
CO2: 27 mmol/L (ref 22–32)
CREATININE: 0.71 mg/dL (ref 0.61–1.24)
Chloride: 103 mmol/L (ref 98–111)
GFR calc Af Amer: >60 mL/min (ref >60)
GFR calc non Af Amer: >60 mL/min (ref >60)
Glucose, Bld: 172 mg/dL — ABNORMAL HIGH (ref 70–99)
Potassium: 5.5 mmol/L — ABNORMAL HIGH (ref 3.5–5.1)
Sodium: 139 mmol/L (ref 135–145)

## 2018-06-25 LAB — CBC
HCT: 39 % (ref 39.0–52.0)
Hemoglobin: 12.6 g/dL — ABNORMAL LOW (ref 13.0–17.0)
MCH: 28.8 pg (ref 26.0–34.0)
MCHC: 32.3 g/dL (ref 30.0–36.0)
MCV: 89.2 fL (ref 80.0–100.0)
Platelets: 176 10*3/uL (ref 150–400)
RBC: 4.37 MIL/uL (ref 4.22–5.81)
RDW: 13.3 % (ref 11.5–15.5)
WBC: 11.7 10*3/uL — ABNORMAL HIGH (ref 4.0–10.5)
nRBC: 0 % (ref 0.0–0.2)

## 2018-06-25 MED ORDER — METHOCARBAMOL 500 MG PO TABS: 500.0000 mg | ORAL_TABLET | Freq: Four times a day (QID) | ORAL | 0 refills | Status: DC | PRN

## 2018-06-25 MED ORDER — TRAMADOL HCL 50 MG PO TABS: 50.0000 mg | ORAL_TABLET | Freq: Four times a day (QID) | ORAL | 0 refills | Status: DC | PRN

## 2018-06-25 MED ORDER — OXYCODONE HCL 5 MG PO TABS: 5.0000 mg | ORAL_TABLET | Freq: Four times a day (QID) | ORAL | 0 refills | Status: DC | PRN

## 2018-06-25 MED ORDER — GABAPENTIN 300 MG PO CAPS: 300.0000 mg | ORAL_CAPSULE | Freq: Three times a day (TID) | ORAL | 0 refills | Status: DC

## 2018-06-25 MED ORDER — ASPIRIN 325 MG PO TBEC: 325.0000 mg | DELAYED_RELEASE_TABLET | Freq: Two times a day (BID) | ORAL | 0 refills | Status: AC

## 2018-06-25 NOTE — Progress Notes (Signed)
Physical Therapy Treatment Patient Details Name: Dalton Murphy MRN: [id number] DOB: 07/06/1950 Today's Date: 06/25/2018    History of Present Illness 68 yo male s/p L TKR on 06/24/18. PMH includes R TKR, gout, pre-DM, LBP, diverticulosis, dysmetabolic syndrome, HLD, sleep apnea.     PT Comments    POD # 1 am session Applied KI and instructed on use and proper application.  Instructed to wear for amb until able to perform 10 active SLR.  Assisted OOB. Demonstrated and instructed pt how to use belt to self assist LE as a leg lifter Assisted with amb. General Gait Details: Min guard for safety. Verbal cuing for placement in RW, sequencing with step-to gait. Tolerated an increased distance. Returned to room and Then returned to room to perform some TE's following HEP handout.  Instructed on proper tech, freq as well as use of ICE.   Pt will need another PT session to complete HEP and address stairs.  Pt progressing well to D/C to home later today.   Follow Up Recommendations  Follow surgeon's recommendation for DC plan and follow-up therapies;Supervision for mobility/OOB(OPPT)     Equipment Recommendations  None recommended by PT    Recommendations for Other Services       Precautions / Restrictions Precautions Precautions: Fall Precaution Comments: instructed on KI use and proper application Required Braces or Orthoses: Knee Immobilizer - Left Knee Immobilizer - Left: On when out of bed or walking;Discontinue once straight leg raise with < 10 degree lag Restrictions Weight Bearing Restrictions: No Other Position/Activity Restrictions: WBAT    Mobility  Bed Mobility Overal bed mobility: Needs Assistance Bed Mobility: Supine to Sit           General bed mobility comments: had pt use gait belt to self assist L LE off bed with increased time  Transfers Overall transfer level: Needs assistance Equipment used: Rolling walker (2 wheeled) Transfers: Sit to/from Stand Sit  to Stand: Supervision;Min guard         General transfer comment: 25% VC's on proper hand placement and safety with turns  Ambulation/Gait Ambulation/Gait assistance: Min guard Gait Distance (Feet): 75 Feet Assistive device: Rolling walker (2 wheeled) Gait Pattern/deviations: Step-to pattern;Decreased stance time - left;Decreased weight shift to left;Antalgic;Trunk flexed     General Gait Details: Min guard for safety. Verbal cuing for placement in RW, sequencing with step-to gait. Tolerated an increased distance.    Stairs             Wheelchair Mobility    Modified Rankin (Stroke Patients Only)       Balance                                            Cognition Arousal/Alertness: Awake/alert Behavior During Therapy: WFL for tasks assessed/performed Overall Cognitive Status: Within Functional Limits for tasks assessed                                 General Comments: had other knee surgery 18 months ago, knowlegable      Exercises   Total Knee Replacement TE's 10 reps B LE ankle pumps 10 reps towel squeezes 10 reps knee presses 10 reps heel slides  10 reps SAQ's 10 reps SLR's 10 reps ABD Followed by ICE    General Comments  Pertinent Vitals/Pain Pain Assessment: 0-10 Pain Score: 7  Pain Location: L knee  Pain Descriptors / Indicators: Sore;Discomfort;Tender Pain Intervention(s): Monitored during session;Premedicated before session;Repositioned;Ice applied    Home Living                      Prior Function            PT Goals (current goals can now be found in the care plan section) Progress towards PT goals: Progressing toward goals    Frequency    7X/week      PT Plan Current plan remains appropriate    Co-evaluation              AM-PAC PT "6 Clicks" Mobility   Outcome Measure  Help needed turning from your back to your side while in a flat bed without using bedrails?: A  Little Help needed moving from lying on your back to sitting on the side of a flat bed without using bedrails?: A Little Help needed moving to and from a bed to a chair (including a wheelchair)?: A Little Help needed standing up from a chair using your arms (e.g., wheelchair or bedside chair)?: A Little Help needed to walk in hospital room?: A Little Help needed climbing 3-5 steps with a railing? : A Lot 6 Click Score: 17    End of Session Equipment Utilized During Treatment: Gait belt;Left knee immobilizer Activity Tolerance: Patient limited by pain;Patient tolerated treatment well Patient left: in chair;with chair alarm set;with call bell/phone within reach Nurse Communication: Mobility status(pt will need 2 PT sessions prior to D/C today) PT Visit Diagnosis: Difficulty in walking, not elsewhere classified (R26.2);Other abnormalities of gait and mobility (R26.89)     Time: 4503-8882 PT Time Calculation (min) (ACUTE ONLY): 25 min  Charges:  $Gait Training: 8-22 mins $Therapeutic Exercise: 8-22 mins                     Rica Koyanagi  PTA Acute  Rehabilitation Services Pager      [phone] Office      [phone]

## 2018-06-25 NOTE — Progress Notes (Signed)
Physical Therapy Treatment Patient Details Name: Dalton Murphy MRN: [id number] DOB: Sep 28, 1950 Today's Date: 06/25/2018    History of Present Illness 68 yo male s/p L TKR on 06/24/18. PMH includes R TKR, gout, pre-DM, LBP, diverticulosis, dysmetabolic syndrome, HLD, sleep apnea.     PT Comments    POD # 1 pm session Spouse present during session to address/practice stairs.  Pt given one crutch to use/take home.  Practiced stairs twice.  Pt required 50% VC's on proper sequencing and safety due to impulsive "wait for spouse" instructions.  Addressed all mobility questions, discussed appropriate activity, educated on use of ICE.  Pt ready for D/C to home.   Follow Up Recommendations  Follow surgeon's recommendation for DC plan and follow-up therapies;Supervision for mobility/OOB     Equipment Recommendations  None recommended by PT    Recommendations for Other Services       Precautions / Restrictions Precautions Precautions: Fall Precaution Comments: instructed on KI use and proper application Required Braces or Orthoses: Knee Immobilizer - Left Knee Immobilizer - Left: On when out of bed or walking;Discontinue once straight leg raise with < 10 degree lag Restrictions Weight Bearing Restrictions: No Other Position/Activity Restrictions: WBAT    Mobility  Bed Mobility Overal bed mobility: Needs Assistance Bed Mobility: Supine to Sit     Supine to sit: Min assist;HOB elevated     General bed mobility comments: had pt use gait belt to self assist L LE off bed with increased time  Transfers Overall transfer level: Needs assistance Equipment used: Rolling walker (2 wheeled) Transfers: Sit to/from Stand Sit to Stand: Supervision;Min guard         General transfer comment: 25% VC's on proper hand placement and safety with turns  Ambulation/Gait Ambulation/Gait assistance: Min guard Gait Distance (Feet): 145 Feet Assistive device: Rolling walker (2  wheeled) Gait Pattern/deviations: Step-to pattern;Decreased stance time - left;Decreased weight shift to left;Antalgic;Trunk flexed Gait velocity: decr    General Gait Details: Min guard for safety. Verbal cuing for placement in RW, sequencing with step-to gait. Tolerated an increased distance.    Stairs Stairs: Yes Stairs assistance: Min guard Stair Management: One rail Left;Step to pattern;Forwards;With crutches Number of Stairs: 3 General stair comments: with spouse present for "hands on" instruction/education   Wheelchair Mobility    Modified Rankin (Stroke Patients Only)       Balance                                            Cognition Arousal/Alertness: Awake/alert Behavior During Therapy: WFL for tasks assessed/performed Overall Cognitive Status: Within Functional Limits for tasks assessed                                 General Comments: had other knee surgery 18 months ago, knowlegable      Exercises      General Comments        Pertinent Vitals/Pain Pain Assessment: 0-10 Pain Score: 5  Pain Location: L knee  Pain Descriptors / Indicators: Sore;Discomfort;Tender Pain Intervention(s): Monitored during session;Repositioned;Ice applied;Premedicated before session    Home Living                      Prior Function            PT  Goals (current goals can now be found in the care plan section) Progress towards PT goals: Progressing toward goals    Frequency    7X/week      PT Plan Current plan remains appropriate    Co-evaluation              AM-PAC PT "6 Clicks" Mobility   Outcome Measure  Help needed turning from your back to your side while in a flat bed without using bedrails?: A Little Help needed moving from lying on your back to sitting on the side of a flat bed without using bedrails?: A Little Help needed moving to and from a bed to a chair (including a wheelchair)?: A Little Help  needed standing up from a chair using your arms (e.g., wheelchair or bedside chair)?: A Little Help needed to walk in hospital room?: A Little Help needed climbing 3-5 steps with a railing? : A Little 6 Click Score: 18    End of Session Equipment Utilized During Treatment: Gait belt;Left knee immobilizer Activity Tolerance: Patient tolerated treatment well Patient left: in bed;with call bell/phone within reach;with family/visitor present Nurse Communication: (pt ready for D/C to home) PT Visit Diagnosis: Difficulty in walking, not elsewhere classified (R26.2);Other abnormalities of gait and mobility (R26.89)     Time: 1450-1515 PT Time Calculation (min) (ACUTE ONLY): 25 min  Charges:  $Gait Training: 8-22 mins $Therapeutic Activity: 8-22 mins                     Rica Koyanagi  PTA Acute  Rehabilitation Services Pager      [phone] Office      [phone]
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
## 2018-06-26 ENCOUNTER — Encounter (HOSPITAL_COMMUNITY): Payer: Self-pay | Admitting: Orthopedic Surgery

## 2018-06-26 LAB — TYPE AND SCREEN
ABO/RH(D): O POS
ANTIBODY SCREEN: POSITIVE
Unit division: 0
Unit division: 0

## 2018-06-26 LAB — BPAM RBC
Blood Product Expiration Date: 202003312359
Blood Product Expiration Date: 202003312359
Unit Type and Rh: 5100
Unit Type and Rh: 5100
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
## 2018-06-28 DIAGNOSIS — M25562 Pain in left knee: Secondary | ICD-10-CM | POA: Diagnosis not present

## 2018-07-01 DIAGNOSIS — M25562 Pain in left knee: Secondary | ICD-10-CM | POA: Diagnosis not present

## 2018-07-03 DIAGNOSIS — M25562 Pain in left knee: Secondary | ICD-10-CM | POA: Diagnosis not present

## 2018-07-08 DIAGNOSIS — M25562 Pain in left knee: Secondary | ICD-10-CM | POA: Diagnosis not present

## 2018-07-12 DIAGNOSIS — M25562 Pain in left knee: Secondary | ICD-10-CM | POA: Diagnosis not present

## 2018-07-15 DIAGNOSIS — M25562 Pain in left knee: Secondary | ICD-10-CM | POA: Diagnosis not present

## 2018-07-17 DIAGNOSIS — M25562 Pain in left knee: Secondary | ICD-10-CM | POA: Diagnosis not present

## 2018-07-24 DIAGNOSIS — M25562 Pain in left knee: Secondary | ICD-10-CM | POA: Diagnosis not present

## 2018-07-29 DIAGNOSIS — M25562 Pain in left knee: Secondary | ICD-10-CM | POA: Diagnosis not present

## 2018-07-30 DIAGNOSIS — Z471 Aftercare following joint replacement surgery: Secondary | ICD-10-CM | POA: Diagnosis not present

## 2018-07-30 DIAGNOSIS — Z96652 Presence of left artificial knee joint: Secondary | ICD-10-CM | POA: Diagnosis not present

## 2018-07-31 DIAGNOSIS — M25562 Pain in left knee: Secondary | ICD-10-CM | POA: Diagnosis not present

## 2018-08-05 DIAGNOSIS — M25562 Pain in left knee: Secondary | ICD-10-CM | POA: Diagnosis not present

## 2018-08-08 DIAGNOSIS — M25562 Pain in left knee: Secondary | ICD-10-CM | POA: Diagnosis not present

## 2018-08-12 DIAGNOSIS — M25562 Pain in left knee: Secondary | ICD-10-CM | POA: Diagnosis not present

## 2018-08-14 DIAGNOSIS — M25562 Pain in left knee: Secondary | ICD-10-CM | POA: Diagnosis not present

## 2018-08-19 DIAGNOSIS — M25562 Pain in left knee: Secondary | ICD-10-CM | POA: Diagnosis not present

## 2018-08-21 DIAGNOSIS — M25562 Pain in left knee: Secondary | ICD-10-CM | POA: Diagnosis not present

## 2018-08-26 DIAGNOSIS — M25562 Pain in left knee: Secondary | ICD-10-CM | POA: Diagnosis not present

## 2018-08-27 ENCOUNTER — Ambulatory Visit: Payer: Medicare Other | Admitting: Pulmonary Disease

## 2018-09-24 ENCOUNTER — Other Ambulatory Visit: Payer: Self-pay | Admitting: Internal Medicine

## 2018-10-02 ENCOUNTER — Ambulatory Visit: Payer: Medicare Other | Admitting: Internal Medicine

## 2018-10-07 ENCOUNTER — Other Ambulatory Visit: Payer: Self-pay

## 2018-10-07 ENCOUNTER — Encounter: Payer: Self-pay | Admitting: Primary Care

## 2018-10-07 ENCOUNTER — Ambulatory Visit: Payer: Medicare Other | Admitting: Pulmonary Disease

## 2018-10-07 ENCOUNTER — Ambulatory Visit (INDEPENDENT_AMBULATORY_CARE_PROVIDER_SITE_OTHER): Payer: Medicare Other | Admitting: Primary Care

## 2018-10-07 DIAGNOSIS — G4733 Obstructive sleep apnea (adult) (pediatric): Secondary | ICD-10-CM

## 2018-10-07 NOTE — Patient Instructions (Signed)
Recommend trying new CPAP mask d/t significant air leaks  Encourage you pause machine when you get up at night  Will review download in 4 weeks, if still experiencing increased events and air leaks will order in-lab titration study  Follow up in 6 months with Dr. Ander Slade   CPAP and BPAP Information CPAP and BPAP are methods of helping a person breathe with the use of air pressure. CPAP stands for "continuous positive airway pressure." BPAP stands for "bi-level positive airway pressure." In both methods, air is blown through your nose or mouth and into your air passages to help you breathe well. CPAP and BPAP use different amounts of pressure to blow air. With CPAP, the amount of pressure stays the same while you breathe in and out. With BPAP, the amount of pressure is increased when you breathe in (inhale) so that you can take larger breaths. Your health care provider will recommend whether CPAP or BPAP would be more helpful for you. Why are CPAP and BPAP treatments used? CPAP or BPAP can be helpful if you have:  Sleep apnea.  Chronic obstructive pulmonary disease (COPD).  Heart failure.  Medical conditions that weaken the muscles of the chest including muscular dystrophy, or neurological diseases such as amyotrophic lateral sclerosis (ALS).  Other problems that cause breathing to be weak, abnormal, or difficult. CPAP is most commonly used for obstructive sleep apnea (OSA) to keep the airways from collapsing when the muscles relax during sleep. How is CPAP or BPAP administered? Both CPAP and BPAP are provided by a small machine with a flexible plastic tube that attaches to a plastic mask. You wear the mask. Air is blown through the mask into your nose or mouth. The amount of pressure that is used to blow the air can be adjusted on the machine. Your health care provider will determine the pressure setting that should be used based on your individual needs. When should CPAP or BPAP be used?  In most cases, the mask only needs to be worn during sleep. Generally, the mask needs to be worn throughout the night and during any daytime naps. People with certain medical conditions may also need to wear the mask at other times when they are awake. Follow instructions from your health care provider about when to use the machine. What are some tips for using the mask?   Because the mask needs to be snug, some people feel trapped or closed-in (claustrophobic) when first using the mask. If you feel this way, you may need to get used to the mask. One way to do this is by holding the mask loosely over your nose or mouth and then gradually applying the mask more snugly. You can also gradually increase the amount of time that you use the mask.  Masks are available in various types and sizes. Some fit over your mouth and nose while others fit over just your nose. If your mask does not fit well, talk with your health care provider about getting a different one.  If you are using a mask that fits over your nose and you tend to breathe through your mouth, a chin strap may be applied to help keep your mouth closed.  The CPAP and BPAP machines have alarms that may sound if the mask comes off or develops a leak.  If you have trouble with the mask, it is very important that you talk with your health care provider about finding a way to make the mask easier to  tolerate. Do not stop using the mask. Stopping the use of the mask could have a negative impact on your health. What are some tips for using the machine?  Place your CPAP or BPAP machine on a secure table or stand near an electrical outlet.  Know where the on/off switch is located on the machine.  Follow instructions from your health care provider about how to set the pressure on your machine and when you should use it.  Do not eat or drink while the CPAP or BPAP machine is on. Food or fluids could get pushed into your lungs by the pressure of the  CPAP or BPAP.  Do not smoke. Tobacco smoke residue can damage the machine.  For home use, CPAP and BPAP machines can be rented or purchased through home health care companies. Many different brands of machines are available. Renting a machine before purchasing may help you find out which particular machine works well for you.  Keep the CPAP or BPAP machine and attachments clean. Ask your health care provider for specific instructions. Get help right away if:  You have redness or open areas around your nose or mouth where the mask fits.  You have trouble using the CPAP or BPAP machine.  You cannot tolerate wearing the CPAP or BPAP mask.  You have pain, discomfort, and bloating in your abdomen. Summary  CPAP and BPAP are methods of helping a person breathe with the use of air pressure.  Both CPAP and BPAP are provided by a small machine with a flexible plastic tube that attaches to a plastic mask.  If you have trouble with the mask, it is very important that you talk with your health care provider about finding a way to make the mask easier to tolerate. This information is not intended to replace advice given to you by your health care provider. Make sure you discuss any questions you have with your health care provider. Document Released: 12/31/2003 Document Revised: 12/04/2017 Document Reviewed: 02/21/2016 Elsevier Interactive Patient Education  2019 [employer].

## 2018-10-07 NOTE — Progress Notes (Addendum)
Virtual Visit via Telephone Note  I connected with Dalton Murphy on 10/07/18 at  4:00 PM EDT by telephone and verified that I am speaking with the correct person using two identifiers.  Location: Patient: Home Provider: Office   I discussed the limitations, risks, security and privacy concerns of performing an evaluation and management service by telephone and the availability of in person appointments. I also discussed with the patient that there may be a patient responsible charge related to this service. The patient expressed understanding and agreed to proceed.  History of Present Illness: 68 year old male, former smoker. PMH significant for OSA. Patient of Dr, Ander Slade. Diagnosed with sleep apnea over 10 years ago, compliant with CPAP. HST showed severe sleep apnea. Auto titrate 5-15cm H20.   10/07/2018 Patient called today for follow-up televisit for OSA. He is doing well, no acute complaints. Gets up 2-6 times a night to use the restroom, takes his mask off and leaves it running. This could be contributing to the amount of air leaks and higher number of events he has been having. Mask may also not be fitting well. DME company is [employer].   [employer] download  Usage 30/30 days (100%); 30 days (100%) > 4 hours Average usage 7 hours 24 mins Pressure 5-15cm H20  Leaks- 67.6 L/min AHI 17.1   Observations/Objective:  - NO shortness of breath, wheezing or cough   Assessment and Plan:  OSA: - Patient is 100% compliant with CPAP and reports benefit from use - Experiencing large amount of air leaks and moderate events  - Recommend trying new mask and pausing CPAP when off  - Download in 4 weeks, if no improvement will need in-lab titration study  Follow Up Instructions:  - Download in 4 weeks, - FU in 6 months with Dr. Ander Slade  I discussed the assessment and treatment plan with the patient. The patient was provided an opportunity to ask questions and all were answered. The  patient agreed with the plan and demonstrated an understanding of the instructions.   The patient was advised to call back or seek an in-person evaluation if the symptoms worsen or if the condition fails to improve as anticipated.  I provided 15 minutes of non-face-to-face time during this encounter.   Martyn Ehrich, NP
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
## 2018-10-08 NOTE — Patient Instructions (Addendum)
  Tests ordered today. Your results will be released to MyChart (or called to you) after review, usually within 72hours after test completion. If any changes need to be made, you will be notified at that same time.  Medications reviewed and updated.  Changes include :   none      Please followup in 6 months   

## 2018-10-09 ENCOUNTER — Encounter: Payer: Self-pay | Admitting: Internal Medicine

## 2018-10-09 ENCOUNTER — Other Ambulatory Visit: Payer: Self-pay

## 2018-10-09 ENCOUNTER — Ambulatory Visit (INDEPENDENT_AMBULATORY_CARE_PROVIDER_SITE_OTHER): Payer: Medicare Other | Admitting: Internal Medicine

## 2018-10-09 VITALS — BP 118/74 | HR 80 | Temp 98.9°F | Resp 16 | Ht 71.0 in | Wt 278.0 lb

## 2018-10-09 DIAGNOSIS — E781 Pure hyperglyceridemia: Secondary | ICD-10-CM | POA: Diagnosis not present

## 2018-10-09 DIAGNOSIS — G4730 Sleep apnea, unspecified: Secondary | ICD-10-CM | POA: Diagnosis not present

## 2018-10-09 DIAGNOSIS — R7303 Prediabetes: Secondary | ICD-10-CM

## 2018-10-09 DIAGNOSIS — E038 Other specified hypothyroidism: Secondary | ICD-10-CM

## 2018-10-09 NOTE — Assessment & Plan Note (Signed)
Check a1c Low sugar / carb diet Stressed regular exercise   

## 2018-10-09 NOTE — Assessment & Plan Note (Signed)
Diet controlled We will check lipid panel although he is not fasting Stressed the importance of regular exercise, healthy diet, decrease portions and weight loss

## 2018-10-09 NOTE — Assessment & Plan Note (Addendum)
Using cpap nightly Discussed the importance of continuing his CPAP Advised weight loss

## 2018-10-09 NOTE — Assessment & Plan Note (Signed)
Clinically euthyroid Check tsh  Titrate med dose if needed  

## 2018-10-10 ENCOUNTER — Other Ambulatory Visit (INDEPENDENT_AMBULATORY_CARE_PROVIDER_SITE_OTHER): Payer: Medicare Other

## 2018-10-10 DIAGNOSIS — R7303 Prediabetes: Secondary | ICD-10-CM | POA: Diagnosis not present

## 2018-10-10 DIAGNOSIS — E781 Pure hyperglyceridemia: Secondary | ICD-10-CM

## 2018-10-10 DIAGNOSIS — E038 Other specified hypothyroidism: Secondary | ICD-10-CM | POA: Diagnosis not present

## 2018-10-10 LAB — COMPREHENSIVE METABOLIC PANEL
ALT: 43 U/L (ref 0–53)
AST: 28 U/L (ref 0–37)
Albumin: 4.4 g/dL (ref 3.5–5.2)
Alkaline Phosphatase: 89 U/L (ref 39–117)
BUN: 17 mg/dL (ref 6–23)
CO2: 24 mEq/L (ref 19–32)
Calcium: 9.3 mg/dL (ref 8.4–10.5)
Chloride: 106 mEq/L (ref 96–112)
Creatinine, Ser: 0.8 mg/dL (ref 0.40–1.50)
GFR: 96.11 mL/min (ref >60.00)
Glucose, Bld: 114 mg/dL — ABNORMAL HIGH (ref 70–99)
Potassium: 4.1 mEq/L (ref 3.5–5.1)
Sodium: 140 mEq/L (ref 135–145)
Total Bilirubin: 1.2 mg/dL (ref 0.2–1.2)
Total Protein: 7.4 g/dL (ref 6.0–8.3)

## 2018-10-10 LAB — LIPID PANEL
Cholesterol: 170 mg/dL (ref 0–200)
HDL: 41.9 mg/dL (ref >39.00)
LDL Cholesterol: 89 mg/dL (ref 0–99)
NonHDL: 128.24
Total CHOL/HDL Ratio: 4
Triglycerides: 198 mg/dL — ABNORMAL HIGH (ref 0.0–149.0)
VLDL: 39.6 mg/dL (ref 0.0–40.0)

## 2018-10-10 LAB — TSH: TSH: 1.8 u[IU]/mL (ref 0.35–4.50)

## 2018-10-10 LAB — HEMOGLOBIN A1C: Hgb A1c MFr Bld: 6.1 % (ref 4.6–6.5)

## 2018-10-13 ENCOUNTER — Encounter: Payer: Self-pay | Admitting: Internal Medicine

## 2018-12-23 ENCOUNTER — Other Ambulatory Visit: Payer: Self-pay | Admitting: Internal Medicine

## 2018-12-24 ENCOUNTER — Telehealth: Payer: Self-pay | Admitting: Internal Medicine

## 2018-12-24 NOTE — Telephone Encounter (Signed)
Sent!

## 2018-12-24 NOTE — Telephone Encounter (Signed)
Copied from [place]. Topic: Quick Communication - Rx Refill/Question >> Dec 24, 2018 10:52 AM Nils Flack wrote: Medication:  levothyroxine Has the patient contacted their pharmacy? Yes.   (Agent: If no, request that the patient contact the pharmacy for the refill.) (Agent: If yes, when and what did the pharmacy advise?) call the office   Preferred Pharmacy (with phone number or street name): [facility]   Agent: Please be advised that RX refills may take up to 3 business days. We ask that you follow-up with your pharmacy.

## 2018-12-26 MED ORDER — LEVOTHYROXINE SODIUM 200 MCG PO TABS: ORAL_TABLET | ORAL | 0 refills | Status: DC

## 2018-12-26 NOTE — Addendum Note (Signed)
Addended by: Delice Bison E on: 12/26/2018 09:45 AM   Modules accepted: Orders

## 2018-12-26 NOTE — Telephone Encounter (Signed)
Sent again

## 2018-12-26 NOTE — Telephone Encounter (Signed)
Pt's spouse called in to follow up. She said that pharmacy is stating that they have not received Rx.   Please assist.

## 2019-02-06 ENCOUNTER — Encounter: Payer: Self-pay | Admitting: Internal Medicine

## 2019-02-06 ENCOUNTER — Ambulatory Visit (INDEPENDENT_AMBULATORY_CARE_PROVIDER_SITE_OTHER): Payer: Medicare Other | Admitting: Internal Medicine

## 2019-02-06 ENCOUNTER — Telehealth: Payer: Self-pay | Admitting: Internal Medicine

## 2019-02-06 DIAGNOSIS — R062 Wheezing: Secondary | ICD-10-CM | POA: Diagnosis not present

## 2019-02-06 DIAGNOSIS — R059 Cough, unspecified: Secondary | ICD-10-CM

## 2019-02-06 DIAGNOSIS — R7303 Prediabetes: Secondary | ICD-10-CM | POA: Diagnosis not present

## 2019-02-06 DIAGNOSIS — R05 Cough: Secondary | ICD-10-CM

## 2019-02-06 MED ORDER — HYDROCODONE-HOMATROPINE 5-1.5 MG/5ML PO SYRP: 5.0000 mL | ORAL_SOLUTION | Freq: Four times a day (QID) | ORAL | 0 refills | Status: DC | PRN

## 2019-02-06 MED ORDER — PREDNISONE 10 MG PO TABS: ORAL_TABLET | ORAL | 0 refills | Status: DC

## 2019-02-06 MED ORDER — ALBUTEROL SULFATE HFA 108 (90 BASE) MCG/ACT IN AERS: 2.0000 | INHALATION_SPRAY | Freq: Four times a day (QID) | RESPIRATORY_TRACT | 2 refills | Status: DC | PRN

## 2019-02-06 MED ORDER — DOXYCYCLINE HYCLATE 100 MG PO TABS: 100.0000 mg | ORAL_TABLET | Freq: Two times a day (BID) | ORAL | 0 refills | Status: DC

## 2019-02-06 NOTE — Telephone Encounter (Signed)
HYDROcodone-homatropine (HYCODAN) 5-1.5 MG/5ML syrup   Jessica from Pharmacy    we can only  do up to 5 days for this particular medication due to  control substance   If you have another medication to fill please send it over

## 2019-02-06 NOTE — Assessment & Plan Note (Signed)
Mild to mod, c/w bronchitis vs pna, declines cxr or ED eval,  for antibx course, cough me prn, to f/u any worsening symptoms or concerns
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
## 2019-02-06 NOTE — Assessment & Plan Note (Signed)
stable overall by history and exam, recent data reviewed with pt, and pt to continue medical treatment as before,  to f/u any worsening symptoms or concerns  

## 2019-02-06 NOTE — Patient Instructions (Signed)
Please take all new medication as prescribed  Please continue all other medications as before, and refills have been done if requested.  Please have the pharmacy call with any other refills you may need.  Please keep your appointments with your specialists as you may have planned     

## 2019-02-06 NOTE — Assessment & Plan Note (Signed)
Mild to mod, likely bronchospastic, for predpac asd, albuterol HFA prn,  to f/u any worsening symptoms or concerns

## 2019-02-07 MED ORDER — HYDROCODONE-HOMATROPINE 5-1.5 MG/5ML PO SYRP: 5.0000 mL | ORAL_SOLUTION | Freq: Four times a day (QID) | ORAL | 0 refills | Status: AC | PRN

## 2019-02-07 NOTE — Telephone Encounter (Signed)
Done erx 

## 2019-03-25 ENCOUNTER — Other Ambulatory Visit: Payer: Self-pay | Admitting: Internal Medicine

## 2019-04-01 NOTE — Patient Instructions (Addendum)
  Tests ordered today. Your results will be released to [facility] (or called to you) after review.  If any changes need to be made, you will be notified at that same time.  Flu immunization administered today.    Medications reviewed and updated.  Changes include :   Try Flonase at night for congestion.   Your prescription(s) have been submitted to your pharmacy. Please take as directed and contact our office if you believe you are having problem(s) with the medication(s).   Please followup in 6 months
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
## 2019-04-02 ENCOUNTER — Other Ambulatory Visit: Payer: Self-pay

## 2019-04-02 ENCOUNTER — Other Ambulatory Visit (INDEPENDENT_AMBULATORY_CARE_PROVIDER_SITE_OTHER): Payer: Medicare Other

## 2019-04-02 ENCOUNTER — Ambulatory Visit (INDEPENDENT_AMBULATORY_CARE_PROVIDER_SITE_OTHER): Payer: Medicare Other | Admitting: Internal Medicine

## 2019-04-02 ENCOUNTER — Encounter: Payer: Self-pay | Admitting: Internal Medicine

## 2019-04-02 VITALS — BP 136/68 | HR 84 | Temp 98.1°F | Resp 18 | Ht 71.0 in | Wt 289.0 lb

## 2019-04-02 DIAGNOSIS — G4733 Obstructive sleep apnea (adult) (pediatric): Secondary | ICD-10-CM | POA: Diagnosis not present

## 2019-04-02 DIAGNOSIS — E038 Other specified hypothyroidism: Secondary | ICD-10-CM

## 2019-04-02 DIAGNOSIS — E781 Pure hyperglyceridemia: Secondary | ICD-10-CM

## 2019-04-02 DIAGNOSIS — R7303 Prediabetes: Secondary | ICD-10-CM | POA: Diagnosis not present

## 2019-04-02 DIAGNOSIS — Z23 Encounter for immunization: Secondary | ICD-10-CM

## 2019-04-02 LAB — TSH: TSH: 2.12 u[IU]/mL (ref 0.35–4.50)

## 2019-04-02 LAB — HEMOGLOBIN A1C: Hgb A1c MFr Bld: 6.6 % — ABNORMAL HIGH (ref 4.6–6.5)

## 2019-04-02 LAB — COMPREHENSIVE METABOLIC PANEL
ALT: 59 U/L — ABNORMAL HIGH (ref 0–53)
AST: 36 U/L (ref 0–37)
Albumin: 4.5 g/dL (ref 3.5–5.2)
Alkaline Phosphatase: 80 U/L (ref 39–117)
BUN: 19 mg/dL (ref 6–23)
CO2: 27 mEq/L (ref 19–32)
Calcium: 9.2 mg/dL (ref 8.4–10.5)
Chloride: 106 mEq/L (ref 96–112)
Creatinine, Ser: 0.84 mg/dL (ref 0.40–1.50)
GFR: 90.72 mL/min (ref >60.00)
Glucose, Bld: 111 mg/dL — ABNORMAL HIGH (ref 70–99)
Potassium: 4.1 mEq/L (ref 3.5–5.1)
Sodium: 140 mEq/L (ref 135–145)
Total Bilirubin: 0.9 mg/dL (ref 0.2–1.2)
Total Protein: 7.5 g/dL (ref 6.0–8.3)

## 2019-04-02 LAB — LIPID PANEL
Cholesterol: 181 mg/dL (ref 0–200)
HDL: 37.5 mg/dL — ABNORMAL LOW (ref >39.00)
NonHDL: 143.69
Total CHOL/HDL Ratio: 5
Triglycerides: 272 mg/dL — ABNORMAL HIGH (ref 0.0–149.0)
VLDL: 54.4 mg/dL — ABNORMAL HIGH (ref 0.0–40.0)

## 2019-04-02 LAB — LDL CHOLESTEROL, DIRECT: Direct LDL: 108 mg/dL

## 2019-04-02 MED ORDER — FLUTICASONE PROPIONATE 50 MCG/ACT NA SUSP: 2.0000 | Freq: Every day | NASAL | 6 refills | Status: DC

## 2019-04-02 NOTE — Assessment & Plan Note (Addendum)
He is using cpap nightly encouraged weight loss Experiencing congestion making the CPAP difficult use at times Trial of Flonase nightly Discussed that he should not use the albuterol on a nightly basis for the congestion-he is unsure if that actually works or not Using a humidifier in his CPAP Let me know if there is no improvement

## 2019-04-02 NOTE — Assessment & Plan Note (Signed)
Stressed weight loss, improving diet, decreasing carbs, sugars, fats, high cholesterol foods Stressed weight loss Check lipids

## 2019-04-02 NOTE — Assessment & Plan Note (Signed)
Clinically euthyroid Check tsh  Titrate med dose if needed  

## 2019-04-02 NOTE — Assessment & Plan Note (Signed)
Check a1c Low sugar / carb diet Stressed regular exercise   

## 2019-04-02 NOTE — Addendum Note (Signed)
Addended by: Delice Bison E on: 04/02/2019 04:46 PM   Modules accepted: Orders

## 2019-04-03 ENCOUNTER — Encounter: Payer: Self-pay | Admitting: Internal Medicine

## 2019-05-06 ENCOUNTER — Ambulatory Visit: Payer: Medicare PPO | Attending: Internal Medicine

## 2019-05-06 DIAGNOSIS — Z23 Encounter for immunization: Secondary | ICD-10-CM

## 2019-05-06 NOTE — Progress Notes (Signed)
   Covid-19 Vaccination Clinic  Name:  Dalton Murphy    MRN: [id number] DOB: 05/12/1950  05/06/2019  [name] was observed post Covid-19 immunization for 15 minutes without incidence. He was provided with Vaccine Information Sheet and instruction to access the V-Safe system.   [name] was instructed to call 911 with any severe reactions post vaccine: Marland Kitchen Difficulty breathing  . Swelling of your face and throat  . A fast heartbeat  . A bad rash all over your body  . Dizziness and weakness    Immunizations Administered    Name Date Dose VIS Date Route   Pfizer COVID-19 Vaccine 05/06/2019  6:04 PM 0.3 mL 03/28/2019 Intramuscular   Manufacturer: [place]   Lot: [id number]   [place]: [id number]

## 2019-05-28 ENCOUNTER — Ambulatory Visit: Payer: Medicare PPO

## 2019-05-28 ENCOUNTER — Ambulatory Visit: Payer: Medicare PPO | Attending: Internal Medicine

## 2019-05-28 DIAGNOSIS — Z23 Encounter for immunization: Secondary | ICD-10-CM | POA: Insufficient documentation

## 2019-05-28 NOTE — Progress Notes (Signed)
   Covid-19 Vaccination Clinic  Name:  Dalton Murphy    MRN: [id number] DOB: 1950/05/26  05/28/2019  Dalton Murphy was observed post Covid-19 immunization for 15 minutes without incidence. He was provided with Vaccine Information Sheet and instruction to access the V-Safe system.   Dalton Murphy was instructed to call 911 with any severe reactions post vaccine: Marland Kitchen Difficulty breathing  . Swelling of your face and throat  . A fast heartbeat  . A bad rash all over your body  . Dizziness and weakness    Immunizations Administered    Name Date Dose VIS Date Route   Pfizer COVID-19 Vaccine 05/28/2019  9:50 AM 0.3 mL 03/28/2019 Intramuscular   Manufacturer: [place]   Lot: [id number]   [place]: [id number]

## 2019-06-25 ENCOUNTER — Other Ambulatory Visit: Payer: Self-pay

## 2019-06-25 MED ORDER — LEVOTHYROXINE SODIUM 200 MCG PO TABS: ORAL_TABLET | ORAL | 0 refills | Status: DC

## 2019-08-01 ENCOUNTER — Telehealth (INDEPENDENT_AMBULATORY_CARE_PROVIDER_SITE_OTHER): Payer: Medicare PPO | Admitting: Family

## 2019-08-01 ENCOUNTER — Telehealth: Payer: Medicare PPO | Admitting: Family

## 2019-08-01 DIAGNOSIS — J019 Acute sinusitis, unspecified: Secondary | ICD-10-CM | POA: Diagnosis not present

## 2019-08-01 MED ORDER — DOXYCYCLINE HYCLATE 100 MG PO TABS: 100.0000 mg | ORAL_TABLET | Freq: Two times a day (BID) | ORAL | 0 refills | Status: DC

## 2019-08-01 MED ORDER — FLUTICASONE PROPIONATE 50 MCG/ACT NA SUSP: 2.0000 | Freq: Every day | NASAL | 6 refills | Status: DC

## 2019-08-01 MED ORDER — ALBUTEROL SULFATE HFA 108 (90 BASE) MCG/ACT IN AERS: 2.0000 | INHALATION_SPRAY | Freq: Four times a day (QID) | RESPIRATORY_TRACT | 2 refills | Status: DC | PRN
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
## 2019-09-30 NOTE — Assessment & Plan Note (Addendum)
Chronic - dx 03/2019 Diet controlled Check a1c, urine micro Stressed weight loss and Low sugar / carb diet Stressed regular exercise
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
## 2019-09-30 NOTE — Patient Instructions (Addendum)
°  Blood work was ordered.     Medications reviewed and updated.  Changes include :   flonase for allergies, start anoro in morning ( inhaler).  Use albuterol as needed only.   Your prescription(s) have been submitted to your pharmacy. Please take as directed and contact our office if you believe you are having problem(s) with the medication(s).    Please followup in 6 months

## 2019-10-01 ENCOUNTER — Ambulatory Visit (INDEPENDENT_AMBULATORY_CARE_PROVIDER_SITE_OTHER): Payer: Medicare PPO | Admitting: Internal Medicine

## 2019-10-01 ENCOUNTER — Other Ambulatory Visit: Payer: Self-pay

## 2019-10-01 ENCOUNTER — Encounter: Payer: Self-pay | Admitting: Internal Medicine

## 2019-10-01 VITALS — BP 128/68 | HR 78 | Temp 98.2°F | Ht 71.0 in | Wt 279.0 lb

## 2019-10-01 DIAGNOSIS — J309 Allergic rhinitis, unspecified: Secondary | ICD-10-CM

## 2019-10-01 DIAGNOSIS — R0609 Other forms of dyspnea: Secondary | ICD-10-CM

## 2019-10-01 DIAGNOSIS — E038 Other specified hypothyroidism: Secondary | ICD-10-CM | POA: Diagnosis not present

## 2019-10-01 DIAGNOSIS — R0600 Dyspnea, unspecified: Secondary | ICD-10-CM | POA: Diagnosis not present

## 2019-10-01 DIAGNOSIS — E119 Type 2 diabetes mellitus without complications: Secondary | ICD-10-CM | POA: Diagnosis not present

## 2019-10-01 MED ORDER — FLUTICASONE PROPIONATE 50 MCG/ACT NA SUSP: 2.0000 | Freq: Every day | NASAL | 6 refills | Status: DC

## 2019-10-01 MED ORDER — UMECLIDINIUM-VILANTEROL 62.5-25 MCG/INH IN AEPB: 1.0000 | INHALATION_SPRAY | Freq: Every day | RESPIRATORY_TRACT | 5 refills | Status: DC

## 2019-10-01 NOTE — Assessment & Plan Note (Addendum)
Chronic PND, ear popping Advised oral anti-histamine and flonase Uses albuterol as needed

## 2019-10-01 NOTE — Assessment & Plan Note (Signed)
Chronic Albuterol helps but using too often ? Copd Deferred pulm referral at this time Will try anoro - if covered - once daily Stressed exercise, weight loss

## 2019-10-01 NOTE — Assessment & Plan Note (Signed)
Chronic  Clinically euthyroid Check tsh  Titrate med dose if needed  

## 2019-10-02 LAB — LDL CHOLESTEROL, DIRECT: Direct LDL: 102 mg/dL

## 2019-10-02 LAB — COMPREHENSIVE METABOLIC PANEL
ALT: 44 U/L (ref 0–53)
AST: 24 U/L (ref 0–37)
Albumin: 4.4 g/dL (ref 3.5–5.2)
Alkaline Phosphatase: 79 U/L (ref 39–117)
BUN: 22 mg/dL (ref 6–23)
CO2: 22 mEq/L (ref 19–32)
Calcium: 9.2 mg/dL (ref 8.4–10.5)
Chloride: 107 mEq/L (ref 96–112)
Creatinine, Ser: 0.94 mg/dL (ref 0.40–1.50)
GFR: 79.56 mL/min (ref >60.00)
Glucose, Bld: 110 mg/dL — ABNORMAL HIGH (ref 70–99)
Potassium: 3.9 mEq/L (ref 3.5–5.1)
Sodium: 139 mEq/L (ref 135–145)
Total Bilirubin: 1 mg/dL (ref 0.2–1.2)
Total Protein: 7.4 g/dL (ref 6.0–8.3)

## 2019-10-02 LAB — LIPID PANEL
Cholesterol: 172 mg/dL (ref 0–200)
HDL: 37.7 mg/dL — ABNORMAL LOW (ref >39.00)
NonHDL: 134.74
Total CHOL/HDL Ratio: 5
Triglycerides: 290 mg/dL — ABNORMAL HIGH (ref 0.0–149.0)
VLDL: 58 mg/dL — ABNORMAL HIGH (ref 0.0–40.0)

## 2019-10-02 LAB — MICROALBUMIN / CREATININE URINE RATIO
Creatinine,U: 91.4 mg/dL
Microalb Creat Ratio: 1.4 mg/g (ref 0.0–30.0)
Microalb, Ur: 1.3 mg/dL (ref 0.0–1.9)

## 2019-10-02 LAB — TSH: TSH: 0.7 u[IU]/mL (ref 0.35–4.50)

## 2019-10-02 LAB — HEMOGLOBIN A1C: Hgb A1c MFr Bld: 6.7 % — ABNORMAL HIGH (ref 4.6–6.5)

## 2019-10-10 ENCOUNTER — Telehealth: Payer: Self-pay | Admitting: Internal Medicine

## 2019-10-10 MED ORDER — DICLOFENAC SODIUM 75 MG PO TBEC: 75.0000 mg | DELAYED_RELEASE_TABLET | Freq: Two times a day (BID) | ORAL | 5 refills | Status: DC | PRN

## 2019-10-10 MED ORDER — BEVESPI AEROSPHERE 9-4.8 MCG/ACT IN AERO: 2.0000 | INHALATION_SPRAY | Freq: Two times a day (BID) | RESPIRATORY_TRACT | 5 refills | Status: DC

## 2019-10-10 NOTE — Telephone Encounter (Signed)
New inhaler sent.     Diclofenac sent

## 2019-10-10 NOTE — Telephone Encounter (Signed)
   Patient calling to report umeclidinium-vilanterol (ANORO ELLIPTA) 62.5-25 MCG/INH AEPB no covered under insurance.  Also requesting Dr Quay Burow prescribe Diclofenac

## 2019-12-10 DIAGNOSIS — G4733 Obstructive sleep apnea (adult) (pediatric): Secondary | ICD-10-CM | POA: Diagnosis not present

## 2019-12-22 ENCOUNTER — Other Ambulatory Visit: Payer: Self-pay | Admitting: Internal Medicine

## 2020-02-21 ENCOUNTER — Other Ambulatory Visit: Payer: Self-pay | Admitting: Family

## 2020-03-23 ENCOUNTER — Other Ambulatory Visit: Payer: Self-pay | Admitting: Internal Medicine
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
## 2020-03-30 NOTE — Patient Instructions (Addendum)
Blood work was ordered.     Flu immunization administered today.   Medications changes include :  zpak for your cold symptoms    Your prescription(s) have been submitted to your pharmacy.    A referral was ordered for GI for a colonoscopy.     Someone will call you to schedule an appointment.    Please followup in 6 months    Health Maintenance, Male Adopting a healthy lifestyle and getting preventive care are important in promoting health and wellness. Ask your health care provider about:  The right schedule for you to have regular tests and exams.  Things you can do on your own to prevent diseases and keep yourself healthy. What should I know about diet, weight, and exercise? Eat a healthy diet   Eat a diet that includes plenty of vegetables, fruits, low-fat dairy products, and lean protein.  Do not eat a lot of foods that are high in solid fats, added sugars, or sodium. Maintain a healthy weight Body mass index (BMI) is a measurement that can be used to identify possible weight problems. It estimates body fat based on height and weight. Your health care provider can help determine your BMI and help you achieve or maintain a healthy weight. Get regular exercise Get regular exercise. This is one of the most important things you can do for your health. Most adults should:  Exercise for at least 150 minutes each week. The exercise should increase your heart rate and make you sweat (moderate-intensity exercise).  Do strengthening exercises at least twice a week. This is in addition to the moderate-intensity exercise.  Spend less time sitting. Even light physical activity can be beneficial. Watch cholesterol and blood lipids Have your blood tested for lipids and cholesterol at 69 years of age, then have this test every 5 years. You may need to have your cholesterol levels checked more often if:  Your lipid or cholesterol levels are high.  You are older than 69 years of  age.  You are at high risk for heart disease. What should I know about cancer screening? Many types of cancers can be detected early and may often be prevented. Depending on your health history and family history, you may need to have cancer screening at various ages. This may include screening for:  Colorectal cancer.  Prostate cancer.  Skin cancer.  Lung cancer. What should I know about heart disease, diabetes, and high blood pressure? Blood pressure and heart disease  High blood pressure causes heart disease and increases the risk of stroke. This is more likely to develop in people who have high blood pressure readings, are of African descent, or are overweight.  Talk with your health care provider about your target blood pressure readings.  Have your blood pressure checked: ? Every 3-5 years if you are 42-58 years of age. ? Every year if you are 26 years old or older.  If you are between the ages of 69 and 79 and are a current or former smoker, ask your health care provider if you should have a one-time screening for abdominal aortic aneurysm (AAA). Diabetes Have regular diabetes screenings. This checks your fasting blood sugar level. Have the screening done:  Once every three years after age 66 if you are at a normal weight and have a low risk for diabetes.  More often and at a younger age if you are overweight or have a high risk for diabetes. What should I know about preventing infection?  Hepatitis B If you have a higher risk for hepatitis B, you should be screened for this virus. Talk with your health care provider to find out if you are at risk for hepatitis B infection. Hepatitis C Blood testing is recommended for:  Everyone born from [birth year] through 1965.  Anyone with known risk factors for hepatitis C. Sexually transmitted infections (STIs)  You should be screened each year for STIs, including gonorrhea and chlamydia, if: ? You are sexually active and are younger  than 69 years of age. ? You are older than 69 years of age and your health care provider tells you that you are at risk for this type of infection. ? Your sexual activity has changed since you were last screened, and you are at increased risk for chlamydia or gonorrhea. Ask your health care provider if you are at risk.  Ask your health care provider about whether you are at high risk for HIV. Your health care provider may recommend a prescription medicine to help prevent HIV infection. If you choose to take medicine to prevent HIV, you should first get tested for HIV. You should then be tested every 3 months for as long as you are taking the medicine. Follow these instructions at home: Lifestyle  Do not use any products that contain nicotine or tobacco, such as cigarettes, e-cigarettes, and chewing tobacco. If you need help quitting, ask your health care provider.  Do not use street drugs.  Do not share needles.  Ask your health care provider for help if you need support or information about quitting drugs. Alcohol use  Do not drink alcohol if your health care provider tells you not to drink.  If you drink alcohol: ? Limit how much you have to 0-2 drinks a day. ? Be aware of how much alcohol is in your drink. In the [place] one drink equals one 12 oz bottle of beer (355 mL), one 5 oz glass of wine (148 mL), or one 1 oz glass of hard liquor (44 mL). General instructions  Schedule regular health, dental, and eye exams.  Stay current with your vaccines.  Tell your health care provider if: ? You often feel depressed. ? You have ever been abused or do not feel safe at home. Summary  Adopting a healthy lifestyle and getting preventive care are important in promoting health and wellness.  Follow your health care provider's instructions about healthy diet, exercising, and getting tested or screened for diseases.  Follow your health care provider's instructions on monitoring your  cholesterol and blood pressure. This information is not intended to replace advice given to you by your health care provider. Make sure you discuss any questions you have with your health care provider. Document Revised: 03/27/2018 Document Reviewed: 03/27/2018 Elsevier Patient Education  2020 [employer].

## 2020-03-31 ENCOUNTER — Other Ambulatory Visit: Payer: Self-pay

## 2020-03-31 ENCOUNTER — Ambulatory Visit (INDEPENDENT_AMBULATORY_CARE_PROVIDER_SITE_OTHER): Payer: Medicare PPO | Admitting: Internal Medicine

## 2020-03-31 ENCOUNTER — Encounter: Payer: Self-pay | Admitting: Internal Medicine

## 2020-03-31 VITALS — BP 132/80 | HR 80 | Temp 98.4°F | Ht 71.0 in | Wt 275.0 lb

## 2020-03-31 DIAGNOSIS — Z125 Encounter for screening for malignant neoplasm of prostate: Secondary | ICD-10-CM | POA: Diagnosis not present

## 2020-03-31 DIAGNOSIS — E119 Type 2 diabetes mellitus without complications: Secondary | ICD-10-CM

## 2020-03-31 DIAGNOSIS — R3915 Urgency of urination: Secondary | ICD-10-CM

## 2020-03-31 DIAGNOSIS — Z860100 Personal history of colon polyps, unspecified: Secondary | ICD-10-CM

## 2020-03-31 DIAGNOSIS — Z0001 Encounter for general adult medical examination with abnormal findings: Secondary | ICD-10-CM

## 2020-03-31 DIAGNOSIS — E038 Other specified hypothyroidism: Secondary | ICD-10-CM | POA: Diagnosis not present

## 2020-03-31 DIAGNOSIS — R0609 Other forms of dyspnea: Secondary | ICD-10-CM

## 2020-03-31 DIAGNOSIS — R0600 Dyspnea, unspecified: Secondary | ICD-10-CM | POA: Diagnosis not present

## 2020-03-31 DIAGNOSIS — E781 Pure hyperglyceridemia: Secondary | ICD-10-CM

## 2020-03-31 DIAGNOSIS — J069 Acute upper respiratory infection, unspecified: Secondary | ICD-10-CM | POA: Diagnosis not present

## 2020-03-31 MED ORDER — AZITHROMYCIN 250 MG PO TABS: ORAL_TABLET | ORAL | 0 refills | Status: DC

## 2020-03-31 NOTE — Assessment & Plan Note (Signed)
Chronic  Clinically euthyroid Currently taking levothyroxine 200 mcg Check tsh  Titrate med dose if needed

## 2020-03-31 NOTE — Assessment & Plan Note (Signed)
Acute Persistent x > 3 weeks Concern for bacterial cause zpak Continue symptomatic treatment with over-the-counter cold medications, Tylenol/ibuprofen Increase rest and fluids Call if symptoms worsen or do not improve

## 2020-03-31 NOTE — Assessment & Plan Note (Signed)
Chronic Stressed weight loss and regular exercise since he is likely deconditioning Uses albuterol as needed and that does help Has deferred pulmonary referral in the past

## 2020-03-31 NOTE — Assessment & Plan Note (Signed)
Chronic He does have some difficulty with urination-weak stream Likely related to BPH and not emptying his bladder Discussed starting Flomax-he deferred for now, but will let me know if he changes his mind

## 2020-03-31 NOTE — Assessment & Plan Note (Signed)
Chronic Discussed with him that since he is a diabetic and is at an increased risk of cardiovascular events he does need to consider going on a cholesterol medication We will check lipid panel and discuss further after the results Stressed regular exercise, weight loss and healthy diet

## 2020-03-31 NOTE — Assessment & Plan Note (Signed)
Last colonoscopy 2016-looks like repeat should have been 5 years which is now Will refer to GI so they can evaluate if he is due

## 2020-03-31 NOTE — Assessment & Plan Note (Addendum)
Chronic Discussed that he does have diabetes at length.  Stressed the lifestyle changes that he needs to do to hopefully prevent medication or at least help keep the sugars controlled Diet controlled Check a1c Low sugar / carb diet Stressed regular exercise Stressed weight loss

## 2020-04-01 ENCOUNTER — Other Ambulatory Visit: Payer: Self-pay | Admitting: Internal Medicine

## 2020-04-01 LAB — TSH: TSH: 0.33 u[IU]/mL — ABNORMAL LOW (ref 0.35–4.50)

## 2020-04-01 LAB — PSA, TOTAL AND FREE
PSA, % Free: 33 % (calc) (ref >25)
PSA, Free: 1 ng/mL
PSA, Total: 3 ng/mL (ref <=4.0)

## 2020-04-01 LAB — COMPREHENSIVE METABOLIC PANEL
ALT: 44 U/L (ref 0–53)
AST: 33 U/L (ref 0–37)
Albumin: 4.5 g/dL (ref 3.5–5.2)
Alkaline Phosphatase: 72 U/L (ref 39–117)
BUN: 19 mg/dL (ref 6–23)
CO2: 24 mEq/L (ref 19–32)
Calcium: 9.3 mg/dL (ref 8.4–10.5)
Chloride: 106 mEq/L (ref 96–112)
Creatinine, Ser: 0.84 mg/dL (ref 0.40–1.50)
GFR: 88.94 mL/min (ref >60.00)
Glucose, Bld: 109 mg/dL — ABNORMAL HIGH (ref 70–99)
Potassium: 4.2 mEq/L (ref 3.5–5.1)
Sodium: 139 mEq/L (ref 135–145)
Total Bilirubin: 1 mg/dL (ref 0.2–1.2)
Total Protein: 7.7 g/dL (ref 6.0–8.3)

## 2020-04-01 LAB — CBC WITH DIFFERENTIAL/PLATELET
Basophils Absolute: 0.1 10*3/uL (ref 0.0–0.1)
Basophils Relative: 1.6 % (ref 0.0–3.0)
Eosinophils Absolute: 0.2 10*3/uL (ref 0.0–0.7)
Eosinophils Relative: 2.3 % (ref 0.0–5.0)
HCT: 43.8 % (ref 39.0–52.0)
Hemoglobin: 14.7 g/dL (ref 13.0–17.0)
Lymphocytes Relative: 27.7 % (ref 12.0–46.0)
Lymphs Abs: 1.9 10*3/uL (ref 0.7–4.0)
MCHC: 33.6 g/dL (ref 30.0–36.0)
MCV: 87.9 fl (ref 78.0–100.0)
Monocytes Absolute: 0.4 10*3/uL (ref 0.1–1.0)
Monocytes Relative: 6 % (ref 3.0–12.0)
Neutro Abs: 4.3 10*3/uL (ref 1.4–7.7)
Neutrophils Relative %: 62.4 % (ref 43.0–77.0)
Platelets: 198 10*3/uL (ref 150.0–400.0)
RBC: 4.98 Mil/uL (ref 4.22–5.81)
RDW: 14.2 % (ref 11.5–15.5)
WBC: 6.9 10*3/uL (ref 4.0–10.5)

## 2020-04-01 LAB — HEMOGLOBIN A1C: Hgb A1c MFr Bld: 6.5 % (ref 4.6–6.5)

## 2020-04-01 LAB — LIPID PANEL
Cholesterol: 184 mg/dL (ref 0–200)
HDL: 44.6 mg/dL (ref >39.00)
NonHDL: 138.93
Total CHOL/HDL Ratio: 4
Triglycerides: 280 mg/dL — ABNORMAL HIGH (ref 0.0–149.0)
VLDL: 56 mg/dL — ABNORMAL HIGH (ref 0.0–40.0)

## 2020-04-01 LAB — LDL CHOLESTEROL, DIRECT: Direct LDL: 114 mg/dL

## 2020-04-01 MED ORDER — LEVOTHYROXINE SODIUM 200 MCG PO TABS: ORAL_TABLET | ORAL | 1 refills | Status: DC

## 2020-04-23 ENCOUNTER — Telehealth (INDEPENDENT_AMBULATORY_CARE_PROVIDER_SITE_OTHER): Payer: Medicare PPO | Admitting: Internal Medicine

## 2020-04-23 ENCOUNTER — Other Ambulatory Visit: Payer: Self-pay

## 2020-04-23 ENCOUNTER — Encounter: Payer: Self-pay | Admitting: Internal Medicine

## 2020-04-23 DIAGNOSIS — J209 Acute bronchitis, unspecified: Secondary | ICD-10-CM | POA: Diagnosis not present

## 2020-04-23 MED ORDER — HYDROCOD POLST-CPM POLST ER 10-8 MG/5ML PO SUER: 5.0000 mL | Freq: Two times a day (BID) | ORAL | 0 refills | Status: DC | PRN

## 2020-04-23 MED ORDER — CEFDINIR 300 MG PO CAPS: 300.0000 mg | ORAL_CAPSULE | Freq: Two times a day (BID) | ORAL | 0 refills | Status: DC

## 2020-04-23 NOTE — Progress Notes (Signed)
Virtual Visit via Video Note  I connected with Dalton Murphy on 04/23/20 at  2:30 PM EST by a video enabled telemedicine application and verified that I am speaking with the correct person using two identifiers.   I discussed the limitations of evaluation and management by telemedicine and the availability of in person appointments. The patient expressed understanding and agreed to proceed.  Present for the visit:  Myself, Dr Billey Gosling, Maurine Simmering.  The patient is currently at home and I am in the office.    No referring provider.    History of Present Illness: He is here for an acute visit for cold symptoms.  He took a zpak 12/15 for similar symptoms.  He has had an intermittent cough since then. It has gotten worse in the past week.     He is experiencing chills, nasal congestion, postnasal drip, popping and pressure in his ears without pain cough that is productive at times, some mild shortness of breath and wheezing.  He denies any fevers.  The symptoms are getting worse.  He has tried taking mucinex   Review of Systems  Constitutional: Positive for chills. Negative for fever.  HENT: Positive for congestion. Negative for ear pain, sinus pain and sore throat.        PND, ETD  Respiratory: Positive for cough, sputum production, shortness of breath (mild) and wheezing.   Gastrointestinal: Negative for diarrhea and nausea.  Neurological: Negative for headaches.      Social History   Socioeconomic History  . Marital status: Married    Spouse name: Not on file  . Number of children: Not on file  . Years of education: Not on file  . Highest education level: Not on file  Occupational History  . Not on file  Tobacco Use  . Smoking status: Former Smoker    Years: 10.00    Types: Cigarettes  . Smokeless tobacco: Never Used  . Tobacco comment: up to 1 ppd  Vaping Use  . Vaping Use: Never used  Substance and Sexual Activity  . Alcohol use: Yes    Alcohol/week:  8.0 standard drinks    Types: 1 Glasses of wine, 6 Cans of beer, 1 Shots of liquor per week    Comment:  occasionally  . Drug use: No  . Sexual activity: Not Currently  Other Topics Concern  . Not on file  Social History Narrative  . Not on file   Social Determinants of Health   Financial Resource Strain: Not on file  Food Insecurity: Not on file  Transportation Needs: Not on file  Physical Activity: Not on file  Stress: Not on file  Social Connections: Not on file     Observations/Objective: Appears well in NAD Breathing normally Skin appears warm and dry  Assessment and Plan:  See Problem List for Assessment and Plan of chronic medical problems.   Follow Up Instructions:    I discussed the assessment and treatment plan with the patient. The patient was provided an opportunity to ask questions and all were answered. The patient agreed with the plan and demonstrated an understanding of the instructions.   The patient was advised to call back or seek an in-person evaluation if the symptoms worsen or if the condition fails to improve as anticipated.    Binnie Rail, MD

## 2020-04-23 NOTE — Telephone Encounter (Signed)
error 

## 2020-04-23 NOTE — Assessment & Plan Note (Signed)
Acute Likely bacterial  Start Omnicef 300 mg BID x 10 day Tussionex twice daily as needed otc cold medications Rest, fluid Call if no improvement

## 2020-05-05 ENCOUNTER — Other Ambulatory Visit: Payer: Self-pay | Admitting: Internal Medicine

## 2020-05-26 ENCOUNTER — Encounter: Payer: Self-pay | Admitting: Gastroenterology

## 2020-06-14 DIAGNOSIS — G4733 Obstructive sleep apnea (adult) (pediatric): Secondary | ICD-10-CM | POA: Diagnosis not present

## 2020-06-23 ENCOUNTER — Telehealth: Payer: Self-pay | Admitting: Internal Medicine

## 2020-06-23 MED ORDER — DICLOFENAC SODIUM 75 MG PO TBEC: 75.0000 mg | DELAYED_RELEASE_TABLET | Freq: Two times a day (BID) | ORAL | 5 refills | Status: DC | PRN

## 2020-06-23 NOTE — Telephone Encounter (Signed)
Patient  requesting refill for diclofenac (VOLTAREN) 75 MG EC tablet  Pharmacy [facility], [place]

## 2020-06-28 ENCOUNTER — Other Ambulatory Visit: Payer: Self-pay | Admitting: Internal Medicine

## 2020-07-28 ENCOUNTER — Other Ambulatory Visit: Payer: Self-pay

## 2020-07-28 ENCOUNTER — Ambulatory Visit (AMBULATORY_SURGERY_CENTER): Payer: Medicare PPO | Admitting: *Deleted

## 2020-07-28 ENCOUNTER — Other Ambulatory Visit: Payer: Self-pay | Admitting: Internal Medicine

## 2020-07-28 VITALS — Ht 71.0 in | Wt 280.0 lb

## 2020-07-28 DIAGNOSIS — Z860100 Personal history of colon polyps, unspecified: Secondary | ICD-10-CM

## 2020-07-28 MED ORDER — NA SULFATE-K SULFATE-MG SULF 17.5-3.13-1.6 GM/177ML PO SOLN: ORAL | 0 refills | Status: DC

## 2020-07-28 NOTE — Progress Notes (Signed)
Patient's pre-visit was done today over the phone with the patient due to COVID-19 pandemic. Name,DOB and address verified. Insurance verified. Patient denies any allergies to Eggs and Soy. Patient denies any problems with anesthesia/sedation. Patient denies taking diet pills or blood thinners. Packet of Prep instructions mailed to patient including a copy of a consent form-pt is aware. Patient understands to call us back with any questions or concerns. The patient is COVID-19 fully vaccinated, per patient. Patient is aware of our care-partner policy and TMHDQ-22 safety protocol. EMMI education assigned to the patient for the procedure, sent to [place].

## 2020-08-11 ENCOUNTER — Ambulatory Visit (AMBULATORY_SURGERY_CENTER): Payer: Medicare PPO | Admitting: Gastroenterology

## 2020-08-11 ENCOUNTER — Other Ambulatory Visit: Payer: Self-pay

## 2020-08-11 ENCOUNTER — Encounter: Payer: Self-pay | Admitting: Gastroenterology

## 2020-08-11 ENCOUNTER — Other Ambulatory Visit: Payer: Self-pay | Admitting: Gastroenterology

## 2020-08-11 VITALS — BP 111/51 | HR 58 | Temp 97.5°F | Resp 13 | Ht 71.0 in | Wt 280.0 lb

## 2020-08-11 DIAGNOSIS — D120 Benign neoplasm of cecum: Secondary | ICD-10-CM | POA: Diagnosis not present

## 2020-08-11 DIAGNOSIS — D123 Benign neoplasm of transverse colon: Secondary | ICD-10-CM

## 2020-08-11 DIAGNOSIS — Z86010 Personal history of colonic polyps: Secondary | ICD-10-CM | POA: Diagnosis not present

## 2020-08-11 DIAGNOSIS — G4733 Obstructive sleep apnea (adult) (pediatric): Secondary | ICD-10-CM | POA: Diagnosis not present

## 2020-08-11 DIAGNOSIS — D128 Benign neoplasm of rectum: Secondary | ICD-10-CM | POA: Diagnosis not present

## 2020-08-11 DIAGNOSIS — D122 Benign neoplasm of ascending colon: Secondary | ICD-10-CM

## 2020-08-11 MED ORDER — SODIUM CHLORIDE 0.9 % IV SOLN: 500.0000 mL | Freq: Once | INTRAVENOUS | Status: DC

## 2020-08-11 NOTE — Op Note (Signed)
[facility] Patient Name: Dalton Murphy Procedure Date: 08/11/2020 1:21 PM MRN: [id number] Endoscopist: Ladene Artist , MD Age: 70 Referring MD:  Date of Birth: July 23, 1950 Gender: Male Account #: [id number] Procedure:                Colonoscopy Indications:              Surveillance: Personal history of adenomatous                            polyps on last colonoscopy > 5 years ago Medicines:                Monitored Anesthesia Care Procedure:                Pre-Anesthesia Assessment:                           - Prior to the procedure, a History and Physical                            was performed, and patient medications and                            allergies were reviewed. The patient's tolerance of                            previous anesthesia was also reviewed. The risks                            and benefits of the procedure and the sedation                            options and risks were discussed with the patient.                            All questions were answered, and informed consent                            was obtained. Prior Anticoagulants: The patient has                            taken no previous anticoagulant or antiplatelet                            agents. ASA Grade Assessment: II - A patient with                            mild systemic disease. After reviewing the risks                            and benefits, the patient was deemed in                            satisfactory condition to undergo the procedure.  After obtaining informed consent, the colonoscope                            was passed under direct vision. Throughout the                            procedure, the patient's blood pressure, pulse, and                            oxygen saturations were monitored continuously. The                            Olympus CF-HQ190 [id number]) Colonoscope was                            introduced through the anus  and advanced to the the                            cecum, identified by appendiceal orifice and                            ileocecal valve. The ileocecal valve, appendiceal                            orifice, and rectum were photographed. The quality                            of the bowel preparation was good. The colonoscopy                            was performed without difficulty. The patient                            tolerated the procedure well. Scope In: 1:22:23 PM Scope Out: 1:46:57 PM Scope Withdrawal Time: 0 hours 22 minutes 44 seconds  Total Procedure Duration: 0 hours 24 minutes 34 seconds  Findings:                 The perianal and digital rectal examinations were                            normal.                           A 20 mm polyp was found in the ileocecal valve. The                            polyp was sessile. The polyp was removed with a                            piecemeal technique using a hot snare. Resection                            and retrieval were complete.  A 25 mm polyp was found in the transverse colon.                            The polyp was sessile and wrapped around a fold.                            The polyp was removed with a piecemeal technique                            using a hot snare. Resection and retrieval were                            complete.                           Five sessile polyps were found in the rectum (2),                            transverse colon (1) and ascending colon (2). The                            polyps were 6 to 8 mm in size. These polyps were                            removed with a cold snare. Resection and retrieval                            were complete.                           Multiple medium-mouthed diverticula were found in                            the sigmoid colon, descending colon and transverse                            colon. There was no evidence of  diverticular                            bleeding.                           Internal hemorrhoids were found during                            retroflexion. The hemorrhoids were small and Grade                            I (internal hemorrhoids that do not prolapse).                           The exam was otherwise without abnormality on  direct and retroflexion views. Complications:            No immediate complications. Estimated blood loss:                            None. Estimated Blood Loss:     Estimated blood loss: none. Impression:               - One 20 mm polyp at the ileocecal valve, removed                            piecemeal using a hot snare. Resected and retrieved.                           - One 25 mm polyp in the transverse colon, removed                            piecemeal using a hot snare. Resected and retrieved.                           - Five 6 to 8 mm polyps in the rectum, in the                            transverse colon and in the ascending colon,                            removed with a cold snare. Resected and retrieved.                           - Moderate diverticulosis in the sigmoid colon, in                            the descending colon and in the transverse colon.                           - Internal hemorrhoids.                           - The examination was otherwise normal on direct                            and retroflexion views. Recommendation:           - Repeat colonoscopy in 6 months for surveillance                            after piecemeal polypectomy.                           - Patient has a contact number available for                            emergencies. The signs and symptoms of potential  delayed complications were discussed with the                            patient. Return to normal activities tomorrow.                            Written discharge instructions were  provided to the                            patient.                           - High fiber diet.                           - Continue present medications.                           - Await pathology results.                           - No aspirin, ibuprofen, naproxen, or other                            non-steroidal anti-inflammatory drugs for 2 weeks                            after polyp removal. Ladene Artist, MD 08/11/2020 1:52:04 PM This report has been signed electronically.

## 2020-08-11 NOTE — Progress Notes (Signed)
Called to room to assist during endoscopic procedure.  Patient ID and intended procedure confirmed with present staff. Received instructions for my participation in the procedure from the performing physician.  

## 2020-08-11 NOTE — Progress Notes (Signed)
Report given to PACU, vss 

## 2020-08-11 NOTE — Patient Instructions (Signed)
Handouts on polyps, hemorrhoids, and diverticulosis given to you today  Await pathology results  Repeat colonoscopy in 6 months    NO ASPIRIN, ASPIRIN CONTAINING PRODUCTS (BC OR GOODY POWDERS) OR NSAIDS (IBUPROFEN, ADVIL, ALEVE, AND MOTRIN) FOR 2 weeks; TYLENOL IS OK TO TAKE   YOU HAD AN ENDOSCOPIC PROCEDURE TODAY AT THE [facility]:   Refer to the procedure report that was given to you for any specific questions about what was found during the examination.  If the procedure report does not answer your questions, please call your gastroenterologist to clarify.  If you requested that your care partner not be given the details of your procedure findings, then the procedure report has been included in a sealed envelope for you to review at your convenience later.  YOU SHOULD EXPECT: Some feelings of bloating in the abdomen. Passage of more gas than usual.  Walking can help get rid of the air that was put into your GI tract during the procedure and reduce the bloating. If you had a lower endoscopy (such as a colonoscopy or flexible sigmoidoscopy) you may notice spotting of blood in your stool or on the toilet paper. If you underwent a bowel prep for your procedure, you may not have a normal bowel movement for a few days.  Please Note:  You might notice some irritation and congestion in your nose or some drainage.  This is from the oxygen used during your procedure.  There is no need for concern and it should clear up in a day or so.  SYMPTOMS TO REPORT IMMEDIATELY:   Following lower endoscopy (colonoscopy or flexible sigmoidoscopy):  Excessive amounts of blood in the stool  Significant tenderness or worsening of abdominal pains  Swelling of the abdomen that is new, acute  Fever of 100F or higher  For urgent or emergent issues, a gastroenterologist can be reached at any hour by calling [phone]. Do not use MyChart messaging for urgent concerns.    DIET:  We do recommend a  small meal at first, but then you may proceed to your regular diet.  Drink plenty of fluids but you should avoid alcoholic beverages for 24 hours.  ACTIVITY:  You should plan to take it easy for the rest of today and you should NOT DRIVE or use heavy machinery until tomorrow (because of the sedation medicines used during the test).    FOLLOW UP: Our staff will call the number listed on your records 48-72 hours following your procedure to check on you and address any questions or concerns that you may have regarding the information given to you following your procedure. If we do not reach you, we will leave a message.  We will attempt to reach you two times.  During this call, we will ask if you have developed any symptoms of COVID 19. If you develop any symptoms (ie: fever, flu-like symptoms, shortness of breath, cough etc.) before then, please call [phone].  If you test positive for Covid 19 in the 2 weeks post procedure, please call and report this information to [place].    If any biopsies were taken you will be contacted by phone or by letter within the next 1-3 weeks.  Please call us at [phone] if you have not heard about the biopsies in 3 weeks.    SIGNATURES/CONFIDENTIALITY: You and/or your care partner have signed paperwork which will be entered into your electronic medical record.  These signatures attest to the fact that that  the information above on your After Visit Summary has been reviewed and is understood.  Full responsibility of the confidentiality of this discharge information lies with you and/or your care-partner.

## 2020-08-11 NOTE — Procedures (Signed)
Vs by CW in adm 

## 2020-08-31 ENCOUNTER — Encounter: Payer: Self-pay | Admitting: Gastroenterology

## 2020-09-21 ENCOUNTER — Other Ambulatory Visit: Payer: Self-pay | Admitting: Internal Medicine

## 2020-10-15 ENCOUNTER — Other Ambulatory Visit: Payer: Self-pay | Admitting: Internal Medicine

## 2020-11-12 ENCOUNTER — Other Ambulatory Visit: Payer: Self-pay | Admitting: Internal Medicine

## 2020-11-19 ENCOUNTER — Telehealth: Payer: Medicare PPO | Admitting: Emergency Medicine

## 2020-11-19 DIAGNOSIS — R059 Cough, unspecified: Secondary | ICD-10-CM

## 2020-11-19 MED ORDER — BENZONATATE 100 MG PO CAPS: 100.0000 mg | ORAL_CAPSULE | Freq: Two times a day (BID) | ORAL | 0 refills | Status: DC | PRN

## 2020-11-19 MED ORDER — AZITHROMYCIN 250 MG PO TABS: ORAL_TABLET | ORAL | 0 refills | Status: DC

## 2020-11-19 NOTE — Progress Notes (Signed)
We are sorry that you are not feeling well.  Here is how we plan to help!  Based on your presentation I believe you most likely have A cough due to a virus.  This is called viral bronchitis and is best treated by rest, plenty of fluids and control of the cough.  You may use Ibuprofen or Tylenol as directed to help your symptoms.     In addition you may use A prescription cough medication called Tessalon Perles '100mg'$ . You may take 1-2 capsules every 8 hours as needed for your cough. I've also sent a Z-pak for you in case you're developing a bacterial infection, but at this point, I think it is still viral.   From your responses in the eVisit questionnaire you describe inflammation in the upper respiratory tract which is causing a significant cough.  This is commonly called Bronchitis and has four common causes:   Allergies Viral Infections Acid Reflux Bacterial Infection Allergies, viruses and acid reflux are treated by controlling symptoms or eliminating the cause. An example might be a cough caused by taking certain blood pressure medications. You stop the cough by changing the medication. Another example might be a cough caused by acid reflux. Controlling the reflux helps control the cough.    HOME CARE Only take medications as instructed by your medical team. Complete the entire course of an antibiotic. Drink plenty of fluids and get plenty of rest. Avoid close contacts especially the very young and the elderly Cover your mouth if you cough or cough into your sleeve. Always remember to wash your hands A steam or ultrasonic humidifier can help congestion.   GET HELP RIGHT AWAY IF: You develop worsening fever. You become short of breath You cough up blood. Your symptoms persist after you have completed your treatment plan MAKE SURE YOU  Understand these instructions. Will watch your condition. Will get help right away if you are not doing well or get worse.    Thank you for  choosing an e-visit.  Your e-visit answers were reviewed by a board certified advanced clinical practitioner to complete your personal care plan. Depending upon the condition, your plan could have included both over the counter or prescription medications.  Please review your pharmacy choice. Make sure the pharmacy is open so you can pick up prescription now. If there is a problem, you may contact your provider through [employer] and have the prescription routed to another pharmacy.  Your safety is important to [place]. If you have drug allergies check your prescription carefully.   For the next 24 hours you can use MyChart to ask questions about today's visit, request a non-urgent call back, or ask for a work or school excuse. You will get an email in the next two days asking about your experience. I hope that your e-visit has been valuable and will speed your recovery.  Approximately 5 minutes was used in reviewing the patient's chart, questionnaire, prescribing medications, and documentation.

## 2020-12-28 ENCOUNTER — Ambulatory Visit (INDEPENDENT_AMBULATORY_CARE_PROVIDER_SITE_OTHER): Payer: Medicare PPO

## 2020-12-28 ENCOUNTER — Other Ambulatory Visit: Payer: Self-pay

## 2020-12-28 DIAGNOSIS — Z0000 Encounter for general adult medical examination without abnormal findings: Secondary | ICD-10-CM | POA: Diagnosis not present
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
## 2020-12-28 NOTE — Patient Instructions (Signed)
Dalton Murphy , Thank you for taking time to come for your Medicare Wellness Visit. I appreciate your ongoing commitment to your health goals. Please review the following plan we discussed and let me know if I can assist you in the future.   Screening recommendations/referrals: Colonoscopy: 08/11/2020; due every 5 years (per patient scheduled for 6 month follow-up) Recommended yearly ophthalmology/optometry visit for glaucoma screening and checkup Recommended yearly dental visit for hygiene and checkup  Vaccinations: Influenza vaccine: Due Fall 2022 Pneumococcal vaccine: 02/09/2016, 04/03/2018, 12/07/2020 Tdap vaccine: 12/14/2014; due every 10 years Shingles vaccine: 04/15/2020, 12/07/2020   Covid-19: 05/06/2019, 05/28/2019  Advanced directives: Please bring a copy of your health care power of attorney and living will to the office at your convenience.  Conditions/risks identified: Yes; Client understands the importance of follow-up with providers by attending scheduled visits and discussed goals to eat healthier, increase physical activity, exercise the brain, socialize more, get enough sleep and make time for laughter.  Next appointment: Please schedule your next Medicare Wellness Visit with your Nurse Health Advisor in 1 year by calling [phone].  Preventive Care 35 Years and Older, Male Preventive care refers to lifestyle choices and visits with your health care provider that can promote health and wellness. What does preventive care include? A yearly physical exam. This is also called an annual well check. Dental exams once or twice a year. Routine eye exams. Ask your health care provider how often you should have your eyes checked. Personal lifestyle choices, including: Daily care of your teeth and gums. Regular physical activity. Eating a healthy diet. Avoiding tobacco and drug use. Limiting alcohol use. Practicing safe sex. Taking low doses of aspirin every day. Taking vitamin  and mineral supplements as recommended by your health care provider. What happens during an annual well check? The services and screenings done by your health care provider during your annual well check will depend on your age, overall health, lifestyle risk factors, and family history of disease. Counseling  Your health care provider may ask you questions about your: Alcohol use. Tobacco use. Drug use. Emotional well-being. Home and relationship well-being. Sexual activity. Eating habits. History of falls. Memory and ability to understand (cognition). Work and work Statistician. Screening  You may have the following tests or measurements: Height, weight, and BMI. Blood pressure. Lipid and cholesterol levels. These may be checked every 5 years, or more frequently if you are over 37 years old. Skin check. Lung cancer screening. You may have this screening every year starting at age 73 if you have a 30-pack-year history of smoking and currently smoke or have quit within the past 15 years. Fecal occult blood test (FOBT) of the stool. You may have this test every year starting at age 52. Flexible sigmoidoscopy or colonoscopy. You may have a sigmoidoscopy every 5 years or a colonoscopy every 10 years starting at age 37. Prostate cancer screening. Recommendations will vary depending on your family history and other risks. Hepatitis C blood test. Hepatitis B blood test. Sexually transmitted disease (STD) testing. Diabetes screening. This is done by checking your blood sugar (glucose) after you have not eaten for a while (fasting). You may have this done every 1-3 years. Abdominal aortic aneurysm (AAA) screening. You may need this if you are a current or former smoker. Osteoporosis. You may be screened starting at age 26 if you are at high risk. Talk with your health care provider about your test results, treatment options, and if necessary, the need for more  tests. Vaccines  Your health care  provider may recommend certain vaccines, such as: Influenza vaccine. This is recommended every year. Tetanus, diphtheria, and acellular pertussis (Tdap, Td) vaccine. You may need a Td booster every 10 years. Zoster vaccine. You may need this after age 25. Pneumococcal 13-valent conjugate (PCV13) vaccine. One dose is recommended after age 86. Pneumococcal polysaccharide (PPSV23) vaccine. One dose is recommended after age 72. Talk to your health care provider about which screenings and vaccines you need and how often you need them. This information is not intended to replace advice given to you by your health care provider. Make sure you discuss any questions you have with your health care provider. Document Released: 04/30/2015 Document Revised: 12/22/2015 Document Reviewed: 02/02/2015 Elsevier Interactive Patient Education  2017 [place] Prevention in the Home Falls can cause injuries. They can happen to people of all ages. There are many things you can do to make your home safe and to help prevent falls. What can I do on the outside of my home? Regularly fix the edges of walkways and driveways and fix any cracks. Remove anything that might make you trip as you walk through a door, such as a raised step or threshold. Trim any bushes or trees on the path to your home. Use bright outdoor lighting. Clear any walking paths of anything that might make someone trip, such as rocks or tools. Regularly check to see if handrails are loose or broken. Make sure that both sides of any steps have handrails. Any raised decks and porches should have guardrails on the edges. Have any leaves, snow, or ice cleared regularly. Use sand or salt on walking paths during winter. Clean up any spills in your garage right away. This includes oil or grease spills. What can I do in the bathroom? Use night lights. Install grab bars by the toilet and in the tub and shower. Do not use towel bars as grab  bars. Use non-skid mats or decals in the tub or shower. If you need to sit down in the shower, use a plastic, non-slip stool. Keep the floor dry. Clean up any water that spills on the floor as soon as it happens. Remove soap buildup in the tub or shower regularly. Attach bath mats securely with double-sided non-slip rug tape. Do not have throw rugs and other things on the floor that can make you trip. What can I do in the bedroom? Use night lights. Make sure that you have a light by your bed that is easy to reach. Do not use any sheets or blankets that are too big for your bed. They should not hang down onto the floor. Have a firm chair that has side arms. You can use this for support while you get dressed. Do not have throw rugs and other things on the floor that can make you trip. What can I do in the kitchen? Clean up any spills right away. Avoid walking on wet floors. Keep items that you use a lot in easy-to-reach places. If you need to reach something above you, use a strong step stool that has a grab bar. Keep electrical cords out of the way. Do not use floor polish or wax that makes floors slippery. If you must use wax, use non-skid floor wax. Do not have throw rugs and other things on the floor that can make you trip. What can I do with my stairs? Do not leave any items on the stairs. Make sure  that there are handrails on both sides of the stairs and use them. Fix handrails that are broken or loose. Make sure that handrails are as long as the stairways. Check any carpeting to make sure that it is firmly attached to the stairs. Fix any carpet that is loose or worn. Avoid having throw rugs at the top or bottom of the stairs. If you do have throw rugs, attach them to the floor with carpet tape. Make sure that you have a light switch at the top of the stairs and the bottom of the stairs. If you do not have them, ask someone to add them for you. What else can I do to help prevent  falls? Wear shoes that: Do not have high heels. Have rubber bottoms. Are comfortable and fit you well. Are closed at the toe. Do not wear sandals. If you use a stepladder: Make sure that it is fully opened. Do not climb a closed stepladder. Make sure that both sides of the stepladder are locked into place. Ask someone to hold it for you, if possible. Clearly mark and make sure that you can see: Any grab bars or handrails. First and last steps. Where the edge of each step is. Use tools that help you move around (mobility aids) if they are needed. These include: Canes. Walkers. Scooters. Crutches. Turn on the lights when you go into a dark area. Replace any light bulbs as soon as they burn out. Set up your furniture so you have a clear path. Avoid moving your furniture around. If any of your floors are uneven, fix them. If there are any pets around you, be aware of where they are. Review your medicines with your doctor. Some medicines can make you feel dizzy. This can increase your chance of falling. Ask your doctor what other things that you can do to help prevent falls. This information is not intended to replace advice given to you by your health care provider. Make sure you discuss any questions you have with your health care provider. Document Released: 01/28/2009 Document Revised: 09/09/2015 Document Reviewed: 05/08/2014 Elsevier Interactive Patient Education  2017 [employer].

## 2020-12-30 ENCOUNTER — Other Ambulatory Visit: Payer: Self-pay | Admitting: Internal Medicine

## 2021-01-07 ENCOUNTER — Telehealth: Payer: Medicare PPO | Admitting: Physician Assistant

## 2021-01-07 DIAGNOSIS — J019 Acute sinusitis, unspecified: Secondary | ICD-10-CM

## 2021-01-07 DIAGNOSIS — B9689 Other specified bacterial agents as the cause of diseases classified elsewhere: Secondary | ICD-10-CM | POA: Diagnosis not present

## 2021-01-07 MED ORDER — AMOXICILLIN-POT CLAVULANATE 875-125 MG PO TABS: 1.0000 | ORAL_TABLET | Freq: Two times a day (BID) | ORAL | 0 refills | Status: DC

## 2021-01-07 MED ORDER — BENZONATATE 100 MG PO CAPS: 100.0000 mg | ORAL_CAPSULE | Freq: Three times a day (TID) | ORAL | 0 refills | Status: DC | PRN

## 2021-01-07 NOTE — Progress Notes (Signed)
E-Visit for Sinus Problems  We are sorry that you are not feeling well.  Here is how we plan to help!  Based on what you have shared with me it looks like you have sinusitis.  Sinusitis is inflammation and infection in the sinus cavities of the head.  Based on your presentation I believe you most likely have Acute Bacterial Sinusitis.  This is an infection caused by bacteria and is treated with antibiotics. I have prescribed Augmentin 875mg /125mg  one tablet twice daily with food, for 7 days. I will also prescribe tessalon perles for the cough. You may use an oral decongestant such as Mucinex D or if you have glaucoma or high blood pressure use plain Mucinex. Saline nasal spray help and can safely be used as often as needed for congestion.  If you develop worsening sinus pain, fever or notice severe headache and vision changes, or if symptoms are not better after completion of antibiotic, please schedule an appointment with a health care provider.    Sinus infections are not as easily transmitted as other respiratory infection, however we still recommend that you avoid close contact with loved ones, especially the very young and elderly.  Remember to wash your hands thoroughly throughout the day as this is the number one way to prevent the spread of infection!  Home Care: Only take medications as instructed by your medical team. Complete the entire course of an antibiotic. Do not take these medications with alcohol. A steam or ultrasonic humidifier can help congestion.  You can place a towel over your head and breathe in the steam from hot water coming from a faucet. Avoid close contacts especially the very young and the elderly. Cover your mouth when you cough or sneeze. Always remember to wash your hands.  Get Help Right Away If: You develop worsening fever or sinus pain. You develop a severe head ache or visual changes. Your symptoms persist after you have completed your treatment plan.  Make  sure you Understand these instructions. Will watch your condition. Will get help right away if you are not doing well or get worse.  Thank you for choosing an e-visit.  Your e-visit answers were reviewed by a board certified advanced clinical practitioner to complete your personal care plan. Depending upon the condition, your plan could have included both over the counter or prescription medications.  Please review your pharmacy choice. Make sure the pharmacy is open so you can pick up prescription now. If there is a problem, you may contact your provider through [employer] and have the prescription routed to another pharmacy.  Your safety is important to [place]. If you have drug allergies check your prescription carefully.   For the next 24 hours you can use MyChart to ask questions about today's visit, request a non-urgent call back, or ask for a work or school excuse. You will get an email in the next two days asking about your experience. I hope that your e-visit has been valuable and will speed your recovery.  I provided 5 minutes of non face-to-face time during this encounter for chart review and documentation.

## 2021-02-21 ENCOUNTER — Other Ambulatory Visit: Payer: Self-pay | Admitting: Internal Medicine

## 2021-03-08 ENCOUNTER — Other Ambulatory Visit: Payer: Self-pay | Admitting: Internal Medicine

## 2021-03-24 ENCOUNTER — Telehealth: Payer: Medicare PPO | Admitting: Physician Assistant

## 2021-03-24 DIAGNOSIS — B9689 Other specified bacterial agents as the cause of diseases classified elsewhere: Secondary | ICD-10-CM

## 2021-03-24 DIAGNOSIS — J019 Acute sinusitis, unspecified: Secondary | ICD-10-CM | POA: Diagnosis not present

## 2021-03-24 MED ORDER — GUAIFENESIN-DM 100-10 MG/5ML PO SYRP: 5.0000 mL | ORAL_SOLUTION | ORAL | 0 refills | Status: DC | PRN

## 2021-03-24 MED ORDER — AMOXICILLIN-POT CLAVULANATE 875-125 MG PO TABS: 1.0000 | ORAL_TABLET | Freq: Two times a day (BID) | ORAL | 0 refills | Status: DC

## 2021-03-24 NOTE — Progress Notes (Signed)

## 2021-03-24 NOTE — Addendum Note (Signed)
Addended by: Mar Daring on: 03/24/2021 09:22 AM   Modules accepted: Orders

## 2021-03-29 ENCOUNTER — Encounter: Payer: Self-pay | Admitting: Internal Medicine

## 2021-03-29 NOTE — Patient Instructions (Addendum)
See an eye doctor.  Consider having your hearing checked.    You need to lose weight.  Start regular exercise and decrease your intake.     Flu immunization administered today.     Blood work was ordered.     Medications changes include : prednisone 20 mg daily x 5 days for the wheeze and shortness of breath.  Start rybelsus for your sugars and to help with weight loss - we can increase this dose after one month.  Start crestor 5 mg daily for your cholesterol.   Your prescription(s) have been submitted to your pharmacy. Please take as directed and contact our office if you believe you are having problem(s) with the medication(s).    Please followup in 3 months    Health Maintenance, Male Adopting a healthy lifestyle and getting preventive care are important in promoting health and wellness. Ask your health care provider about: The right schedule for you to have regular tests and exams. Things you can do on your own to prevent diseases and keep yourself healthy. What should I know about diet, weight, and exercise? Eat a healthy diet  Eat a diet that includes plenty of vegetables, fruits, low-fat dairy products, and lean protein. Do not eat a lot of foods that are high in solid fats, added sugars, or sodium. Maintain a healthy weight Body mass index (BMI) is a measurement that can be used to identify possible weight problems. It estimates body fat based on height and weight. Your health care provider can help determine your BMI and help you achieve or maintain a healthy weight. Get regular exercise Get regular exercise. This is one of the most important things you can do for your health. Most adults should: Exercise for at least 150 minutes each week. The exercise should increase your heart rate and make you sweat (moderate-intensity exercise). Do strengthening exercises at least twice a week. This is in addition to the moderate-intensity exercise. Spend less time sitting. Even  light physical activity can be beneficial. Watch cholesterol and blood lipids Have your blood tested for lipids and cholesterol at 70 years of age, then have this test every 5 years. You may need to have your cholesterol levels checked more often if: Your lipid or cholesterol levels are high. You are older than 70 years of age. You are at high risk for heart disease. What should I know about cancer screening? Many types of cancers can be detected early and may often be prevented. Depending on your health history and family history, you may need to have cancer screening at various ages. This may include screening for: Colorectal cancer. Prostate cancer. Skin cancer. Lung cancer. What should I know about heart disease, diabetes, and high blood pressure? Blood pressure and heart disease High blood pressure causes heart disease and increases the risk of stroke. This is more likely to develop in people who have high blood pressure readings or are overweight. Talk with your health care provider about your target blood pressure readings. Have your blood pressure checked: Every 3-5 years if you are 37-89 years of age. Every year if you are 44 years old or older. If you are between the ages of 30 and 27 and are a current or former smoker, ask your health care provider if you should have a one-time screening for abdominal aortic aneurysm (AAA). Diabetes Have regular diabetes screenings. This checks your fasting blood sugar level. Have the screening done: Once every three years after age 10 if  you are at a normal weight and have a low risk for diabetes. More often and at a younger age if you are overweight or have a high risk for diabetes. What should I know about preventing infection? Hepatitis B If you have a higher risk for hepatitis B, you should be screened for this virus. Talk with your health care provider to find out if you are at risk for hepatitis B infection. Hepatitis C Blood testing is  recommended for: Everyone born from [birth year] through 1965. Anyone with known risk factors for hepatitis C. Sexually transmitted infections (STIs) You should be screened each year for STIs, including gonorrhea and chlamydia, if: You are sexually active and are younger than 70 years of age. You are older than 70 years of age and your health care provider tells you that you are at risk for this type of infection. Your sexual activity has changed since you were last screened, and you are at increased risk for chlamydia or gonorrhea. Ask your health care provider if you are at risk. Ask your health care provider about whether you are at high risk for HIV. Your health care provider may recommend a prescription medicine to help prevent HIV infection. If you choose to take medicine to prevent HIV, you should first get tested for HIV. You should then be tested every 3 months for as long as you are taking the medicine. Follow these instructions at home: Alcohol use Do not drink alcohol if your health care provider tells you not to drink. If you drink alcohol: Limit how much you have to 0-2 drinks a day. Know how much alcohol is in your drink. In the [place] one drink equals one 12 oz bottle of beer (355 mL), one 5 oz glass of wine (148 mL), or one 1 oz glass of hard liquor (44 mL). Lifestyle Do not use any products that contain nicotine or tobacco. These products include cigarettes, chewing tobacco, and vaping devices, such as e-cigarettes. If you need help quitting, ask your health care provider. Do not use street drugs. Do not share needles. Ask your health care provider for help if you need support or information about quitting drugs. General instructions Schedule regular health, dental, and eye exams. Stay current with your vaccines. Tell your health care provider if: You often feel depressed. You have ever been abused or do not feel safe at home. Summary Adopting a healthy lifestyle and getting  preventive care are important in promoting health and wellness. Follow your health care provider's instructions about healthy diet, exercising, and getting tested or screened for diseases. Follow your health care provider's instructions on monitoring your cholesterol and blood pressure. This information is not intended to replace advice given to you by your health care provider. Make sure you discuss any questions you have with your health care provider. Document Revised: 08/23/2020 Document Reviewed: 08/23/2020 Elsevier Patient Education  [place].
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
## 2021-03-30 ENCOUNTER — Ambulatory Visit (INDEPENDENT_AMBULATORY_CARE_PROVIDER_SITE_OTHER): Payer: Medicare PPO | Admitting: Internal Medicine

## 2021-03-30 ENCOUNTER — Other Ambulatory Visit: Payer: Self-pay

## 2021-03-30 VITALS — BP 148/88 | HR 81 | Temp 98.0°F | Ht 71.0 in | Wt 284.0 lb

## 2021-03-30 DIAGNOSIS — M109 Gout, unspecified: Secondary | ICD-10-CM | POA: Diagnosis not present

## 2021-03-30 DIAGNOSIS — E119 Type 2 diabetes mellitus without complications: Secondary | ICD-10-CM | POA: Diagnosis not present

## 2021-03-30 DIAGNOSIS — Z0000 Encounter for general adult medical examination without abnormal findings: Secondary | ICD-10-CM

## 2021-03-30 DIAGNOSIS — E1169 Type 2 diabetes mellitus with other specified complication: Secondary | ICD-10-CM

## 2021-03-30 DIAGNOSIS — R062 Wheezing: Secondary | ICD-10-CM

## 2021-03-30 DIAGNOSIS — E038 Other specified hypothyroidism: Secondary | ICD-10-CM

## 2021-03-30 DIAGNOSIS — Z86010 Personal history of colonic polyps: Secondary | ICD-10-CM

## 2021-03-30 DIAGNOSIS — Z23 Encounter for immunization: Secondary | ICD-10-CM

## 2021-03-30 DIAGNOSIS — E785 Hyperlipidemia, unspecified: Secondary | ICD-10-CM | POA: Diagnosis not present

## 2021-03-30 LAB — CBC WITH DIFFERENTIAL/PLATELET
Basophils Absolute: 0.1 10*3/uL (ref 0.0–0.1)
Basophils Relative: 1.3 % (ref 0.0–3.0)
Eosinophils Absolute: 0.1 10*3/uL (ref 0.0–0.7)
Eosinophils Relative: 2 % (ref 0.0–5.0)
HCT: 44.5 % (ref 39.0–52.0)
Hemoglobin: 15.1 g/dL (ref 13.0–17.0)
Lymphocytes Relative: 22.3 % (ref 12.0–46.0)
Lymphs Abs: 1.6 10*3/uL (ref 0.7–4.0)
MCHC: 34 g/dL (ref 30.0–36.0)
MCV: 87.2 fl (ref 78.0–100.0)
Monocytes Absolute: 0.5 10*3/uL (ref 0.1–1.0)
Monocytes Relative: 6.5 % (ref 3.0–12.0)
Neutro Abs: 4.7 10*3/uL (ref 1.4–7.7)
Neutrophils Relative %: 67.9 % (ref 43.0–77.0)
Platelets: 218 10*3/uL (ref 150.0–400.0)
RBC: 5.11 Mil/uL (ref 4.22–5.81)
RDW: 13.5 % (ref 11.5–15.5)
WBC: 7 10*3/uL (ref 4.0–10.5)

## 2021-03-30 LAB — LIPID PANEL
Cholesterol: 168 mg/dL (ref 0–200)
HDL: 40.5 mg/dL (ref >39.00)
NonHDL: 127.74
Total CHOL/HDL Ratio: 4
Triglycerides: 239 mg/dL — ABNORMAL HIGH (ref 0.0–149.0)
VLDL: 47.8 mg/dL — ABNORMAL HIGH (ref 0.0–40.0)

## 2021-03-30 LAB — COMPREHENSIVE METABOLIC PANEL
ALT: 47 U/L (ref 0–53)
AST: 30 U/L (ref 0–37)
Albumin: 4.4 g/dL (ref 3.5–5.2)
Alkaline Phosphatase: 77 U/L (ref 39–117)
BUN: 18 mg/dL (ref 6–23)
CO2: 26 mEq/L (ref 19–32)
Calcium: 9.3 mg/dL (ref 8.4–10.5)
Chloride: 103 mEq/L (ref 96–112)
Creatinine, Ser: 0.85 mg/dL (ref 0.40–1.50)
GFR: 88.01 mL/min (ref >60.00)
Glucose, Bld: 134 mg/dL — ABNORMAL HIGH (ref 70–99)
Potassium: 4.3 mEq/L (ref 3.5–5.1)
Sodium: 138 mEq/L (ref 135–145)
Total Bilirubin: 1.1 mg/dL (ref 0.2–1.2)
Total Protein: 7.7 g/dL (ref 6.0–8.3)

## 2021-03-30 LAB — MICROALBUMIN / CREATININE URINE RATIO
Creatinine,U: 152.1 mg/dL
Microalb Creat Ratio: 3.3 mg/g (ref 0.0–30.0)
Microalb, Ur: 5 mg/dL — ABNORMAL HIGH (ref 0.0–1.9)

## 2021-03-30 LAB — URIC ACID: Uric Acid, Serum: 7.2 mg/dL (ref 4.0–7.8)

## 2021-03-30 LAB — TSH: TSH: 1.83 u[IU]/mL (ref 0.35–5.50)

## 2021-03-30 LAB — LDL CHOLESTEROL, DIRECT: Direct LDL: 113 mg/dL

## 2021-03-30 LAB — HEMOGLOBIN A1C: Hgb A1c MFr Bld: 7 % — ABNORMAL HIGH (ref 4.6–6.5)

## 2021-03-30 MED ORDER — ROSUVASTATIN CALCIUM 5 MG PO TABS: 5.0000 mg | ORAL_TABLET | Freq: Every day | ORAL | 3 refills | Status: DC

## 2021-03-30 MED ORDER — RYBELSUS 3 MG PO TABS: 3.0000 mg | ORAL_TABLET | Freq: Every day | ORAL | 0 refills | Status: DC

## 2021-03-30 MED ORDER — PREDNISONE 20 MG PO TABS: 20.0000 mg | ORAL_TABLET | Freq: Every day | ORAL | 0 refills | Status: DC

## 2021-03-30 NOTE — Assessment & Plan Note (Signed)
Chronic Lipids higher than ideal especially given history of diabetes Start rosuvastatin 5 mg daily Stressed regular exercise and weight loss along with a healthy diet

## 2021-03-30 NOTE — Assessment & Plan Note (Signed)
Chronic  Clinically euthyroid Currently taking levothyroxine 200 mcg daily Check tsh  Titrate med dose if needed

## 2021-03-30 NOTE — Addendum Note (Signed)
Addended by: Marcina Millard on: 03/30/2021 03:29 PM   Modules accepted: Orders

## 2021-03-30 NOTE — Assessment & Plan Note (Signed)
Chronic Sugars have been controlled Encouraged diabetic diet, regular exercise and weight loss Discussed the importance of weight loss for many reasons-we will start Rybelsus 3 mg daily and titrate as tolerated to help maintain good sugar control and help with weight loss A1c, urine microalbumin today

## 2021-03-30 NOTE — Assessment & Plan Note (Signed)
Acute Related to recent sinus infection Likely reactive airway disease Can continue albuterol inhaler every 6 hours as needed Prednisone 20 mg daily x5 days-would like to keep him at a lower dose given his diabetes and weight Advised him to call if there is no improvement/resolution

## 2021-03-30 NOTE — Assessment & Plan Note (Signed)
Chronic Check uric acid level Takes diclofenac 75 mg twice daily as needed for gout flares-okay to continue

## 2021-04-16 ENCOUNTER — Other Ambulatory Visit: Payer: Self-pay | Admitting: Internal Medicine

## 2021-04-30 ENCOUNTER — Other Ambulatory Visit: Payer: Self-pay | Admitting: Internal Medicine

## 2021-05-03 ENCOUNTER — Encounter: Payer: Self-pay | Admitting: Internal Medicine

## 2021-05-17 DIAGNOSIS — L700 Acne vulgaris: Secondary | ICD-10-CM | POA: Diagnosis not present

## 2021-05-17 DIAGNOSIS — D485 Neoplasm of uncertain behavior of skin: Secondary | ICD-10-CM | POA: Diagnosis not present

## 2021-05-17 DIAGNOSIS — L723 Sebaceous cyst: Secondary | ICD-10-CM | POA: Diagnosis not present

## 2021-05-17 DIAGNOSIS — L821 Other seborrheic keratosis: Secondary | ICD-10-CM | POA: Diagnosis not present

## 2021-05-17 DIAGNOSIS — L570 Actinic keratosis: Secondary | ICD-10-CM | POA: Diagnosis not present

## 2021-05-17 DIAGNOSIS — D225 Melanocytic nevi of trunk: Secondary | ICD-10-CM | POA: Diagnosis not present

## 2021-05-18 ENCOUNTER — Other Ambulatory Visit: Payer: Self-pay | Admitting: Internal Medicine

## 2021-05-19 ENCOUNTER — Other Ambulatory Visit: Payer: Self-pay | Admitting: Internal Medicine

## 2021-06-20 ENCOUNTER — Telehealth: Payer: Medicare PPO | Admitting: Physician Assistant

## 2021-06-20 DIAGNOSIS — J019 Acute sinusitis, unspecified: Secondary | ICD-10-CM | POA: Diagnosis not present

## 2021-06-20 DIAGNOSIS — B9689 Other specified bacterial agents as the cause of diseases classified elsewhere: Secondary | ICD-10-CM | POA: Diagnosis not present

## 2021-06-20 MED ORDER — BENZONATATE 100 MG PO CAPS: 100.0000 mg | ORAL_CAPSULE | Freq: Three times a day (TID) | ORAL | 0 refills | Status: DC | PRN

## 2021-06-20 MED ORDER — AMOXICILLIN-POT CLAVULANATE 875-125 MG PO TABS: 1.0000 | ORAL_TABLET | Freq: Two times a day (BID) | ORAL | 0 refills | Status: DC

## 2021-06-20 NOTE — Progress Notes (Signed)
E-Visit for Sinus Problems ? ?We are sorry that you are not feeling well.  Here is how we plan to help! ? ?Based on what you have shared with me it looks like you have sinusitis.  Sinusitis is inflammation and infection in the sinus cavities of the head.  Based on your presentation I believe you most likely have Acute Bacterial Sinusitis.  This is an infection caused by bacteria and is treated with antibiotics. I have prescribed Augmentin '875mg'$ /'125mg'$  one tablet twice daily with food, for 7 days. I will also prescribe Tessalon perles for your cough. You may use an oral decongestant such as Mucinex D or if you have glaucoma or high blood pressure use plain Mucinex. Saline nasal spray help and can safely be used as often as needed for congestion.  If you develop worsening sinus pain, fever or notice severe headache and vision changes, or if symptoms are not better after completion of antibiotic, please schedule an appointment with a health care provider.   ? ?Sinus infections are not as easily transmitted as other respiratory infection, however we still recommend that you avoid close contact with loved ones, especially the very young and elderly.  Remember to wash your hands thoroughly throughout the day as this is the number one way to prevent the spread of infection! ? ?Home Care: ?Only take medications as instructed by your medical team. ?Complete the entire course of an antibiotic. ?Do not take these medications with alcohol. ?A steam or ultrasonic humidifier can help congestion.  You can place a towel over your head and breathe in the steam from hot water coming from a faucet. ?Avoid close contacts especially the very young and the elderly. ?Cover your mouth when you cough or sneeze. ?Always remember to wash your hands. ? ?Get Help Right Away If: ?You develop worsening fever or sinus pain. ?You develop a severe head ache or visual changes. ?Your symptoms persist after you have completed your treatment  plan. ? ?Make sure you ?Understand these instructions. ?Will watch your condition. ?Will get help right away if you are not doing well or get worse. ? ?Thank you for choosing an e-visit. ? ?Your e-visit answers were reviewed by a board certified advanced clinical practitioner to complete your personal care plan. Depending upon the condition, your plan could have included both over the counter or prescription medications. ? ?Please review your pharmacy choice. Make sure the pharmacy is open so you can pick up prescription now. If there is a problem, you may contact your provider through [employer] and have the prescription routed to another pharmacy.  Your safety is important to [place]. If you have drug allergies check your prescription carefully.  ? ?For the next 24 hours you can use MyChart to ask questions about today's visit, request a non-urgent call back, or ask for a work or school excuse. ?You will get an email in the next two days asking about your experience. I hope that your e-visit has been valuable and will speed your recovery. ? ?I provided 5 minutes of non face-to-face time during this encounter for chart review and documentation.  ? ?

## 2021-06-26 NOTE — Progress Notes (Unsigned)
° ° ° ° °  Subjective:    Patient ID: Dalton Murphy, male    DOB: [birth year], 71 y.o.   MRN: [id number]  This visit occurred during the SARS-CoV-2 public health emergency.  Safety protocols were in place, including screening questions prior to the visit, additional usage of staff PPE, and extensive cleaning of exam room while observing appropriate contact time as indicated for disinfecting solutions.     HPI Dalton Murphy is here for follow up of his chronic medical problems, including DM, hypothyroid, hld    Medications and allergies reviewed with patient and updated if appropriate.  Current Outpatient Medications on File Prior to Visit  Medication Sig Dispense Refill   albuterol (VENTOLIN HFA) 108 (90 Base) MCG/ACT inhaler INHALE 2 PUFFS BY MOUTH EVERY 6 HOURS AS NEEDED FOR WHEEZING FOR SHORTNESS OF BREATH 18 g 5   amoxicillin-clavulanate (AUGMENTIN) 875-125 MG tablet Take 1 tablet by mouth 2 (two) times daily. 14 tablet 0   benzonatate (TESSALON) 100 MG capsule Take 1 capsule (100 mg total) by mouth 3 (three) times daily as needed. 30 capsule 0   diclofenac (VOLTAREN) 75 MG EC tablet Take 1 tablet by mouth twice daily as needed 60 tablet 0   fluticasone (FLONASE) 50 MCG/ACT nasal spray Use 2 spray(s) in each nostril once daily 16 g 5   levothyroxine (SYNTHROID) 200 MCG tablet TAKE 1 TABLET BY MOUTH ONCE DAILY BEFORE BREAKFAST 90 tablet 0   rosuvastatin (CRESTOR) 5 MG tablet Take 1 tablet (5 mg total) by mouth daily. 90 tablet 3   Semaglutide (RYBELSUS) 7 MG TABS Take 7 mg by mouth daily. 30 tablet 3   No current facility-administered medications on file prior to visit.     Review of Systems     Objective:  There were no vitals filed for this visit. BP Readings from Last 3 Encounters:  03/30/21 (!) 148/88  08/11/20 (!) 111/51  03/31/20 132/80   Wt Readings from Last 3 Encounters:  03/30/21 284 lb (128.8 kg)  08/11/20 280 lb (127 kg)  07/28/20 280 lb (127 kg)   There is no  height or weight on file to calculate BMI.    Physical Exam     Lab Results  Component Value Date   WBC 7.0 03/30/2021   HGB 15.1 03/30/2021   HCT 44.5 03/30/2021   PLT 218.0 03/30/2021   GLUCOSE 134 (H) 03/30/2021   CHOL 168 03/30/2021   TRIG 239.0 (H) 03/30/2021   HDL 40.50 03/30/2021   LDLDIRECT 113.0 03/30/2021   LDLCALC 89 10/10/2018   ALT 47 03/30/2021   AST 30 03/30/2021   NA 138 03/30/2021   K 4.3 03/30/2021   CL 103 03/30/2021   CREATININE 0.85 03/30/2021   BUN 18 03/30/2021   CO2 26 03/30/2021   TSH 1.83 03/30/2021   PSA 2.05 09/12/2017   INR 1.0 06/19/2018   HGBA1C 7.0 (H) 03/30/2021   MICROALBUR 5.0 (H) 03/30/2021     Assessment & Plan:    See Problem List for Assessment and Plan of chronic medical problems.

## 2021-06-26 NOTE — Patient Instructions (Addendum)
° ° ° °  Blood work was ordered.     Medications changes include :      Your prescription(s) have been sent to your pharmacy.    A referral was ordered for XX.     Someone from that office will call you to schedule an appointment.    Return in about 6 months (around 12/30/2021) for follow up.

## 2021-06-29 ENCOUNTER — Other Ambulatory Visit: Payer: Self-pay | Admitting: Internal Medicine

## 2021-06-29 ENCOUNTER — Ambulatory Visit: Payer: Medicare PPO | Admitting: Internal Medicine

## 2021-06-29 ENCOUNTER — Encounter: Payer: Self-pay | Admitting: Internal Medicine

## 2021-06-29 ENCOUNTER — Other Ambulatory Visit: Payer: Self-pay

## 2021-06-29 VITALS — BP 130/68 | HR 80 | Temp 98.3°F | Ht 71.0 in | Wt 274.0 lb

## 2021-06-29 DIAGNOSIS — E038 Other specified hypothyroidism: Secondary | ICD-10-CM

## 2021-06-29 DIAGNOSIS — E119 Type 2 diabetes mellitus without complications: Secondary | ICD-10-CM

## 2021-06-29 DIAGNOSIS — R062 Wheezing: Secondary | ICD-10-CM | POA: Diagnosis not present

## 2021-06-29 DIAGNOSIS — R0609 Other forms of dyspnea: Secondary | ICD-10-CM

## 2021-06-29 DIAGNOSIS — E1169 Type 2 diabetes mellitus with other specified complication: Secondary | ICD-10-CM

## 2021-06-29 DIAGNOSIS — G4733 Obstructive sleep apnea (adult) (pediatric): Secondary | ICD-10-CM | POA: Diagnosis not present

## 2021-06-29 LAB — COMPREHENSIVE METABOLIC PANEL
ALT: 46 U/L (ref 0–53)
AST: 30 U/L (ref 0–37)
Albumin: 4.7 g/dL (ref 3.5–5.2)
Alkaline Phosphatase: 71 U/L (ref 39–117)
BUN: 23 mg/dL (ref 6–23)
CO2: 29 mEq/L (ref 19–32)
Calcium: 9.9 mg/dL (ref 8.4–10.5)
Chloride: 100 mEq/L (ref 96–112)
Creatinine, Ser: 1.09 mg/dL (ref 0.40–1.50)
GFR: 68.62 mL/min (ref >60.00)
Glucose, Bld: 121 mg/dL — ABNORMAL HIGH (ref 70–99)
Potassium: 5 mEq/L (ref 3.5–5.1)
Sodium: 137 mEq/L (ref 135–145)
Total Bilirubin: 2.1 mg/dL — ABNORMAL HIGH (ref 0.2–1.2)
Total Protein: 7.5 g/dL (ref 6.0–8.3)

## 2021-06-29 LAB — HEMOGLOBIN A1C: Hgb A1c MFr Bld: 6.5 % (ref 4.6–6.5)

## 2021-06-29 NOTE — Assessment & Plan Note (Signed)
Chronic ?He does have some mild wheezing diffusely on expiration-it is very mild ?There is a chance that he has COPD and possibly chronic bronchitis ?I think his infection is resolved, but I still think the cough, shortness of breath and wheezing are chronic ?Discussed putting him on a daily maintenance inhaler and he did not want to do that at this time ?He deferred pulmonary evaluation ?Continue albuterol inhaler for now-discussed this is not a daily long-term option ?We will hold off on prednisone unless his wheezing does not improve ?

## 2021-06-29 NOTE — Assessment & Plan Note (Signed)
Chronic ?He has had dyspnea on exertion for a couple years ?Likely related to weight, deconditioning possible COPD/chronic bronchitis ?Continue Rybelsus and weight loss efforts ?He did forward using a daily inhaler to see if that would help ?He deferred pulmonary evaluation ?Stressed the importance of increasing his exercise ?Follow-up in 3 months ?

## 2021-06-29 NOTE — Assessment & Plan Note (Signed)
Chronic ?Lab Results  ?Component Value Date  ? HGBA1C 7.0 (H) 03/30/2021  ? ?Continue rybelsus 7 mg daily - will not increase dose due to nausea he experiences now ?Continue weight loss effects ?Exercise more ?Continue decreased portions ?

## 2021-06-29 NOTE — Assessment & Plan Note (Signed)
Chronic Using cpap nightly 

## 2021-08-27 ENCOUNTER — Other Ambulatory Visit: Payer: Self-pay | Admitting: Internal Medicine

## 2021-09-13 ENCOUNTER — Telehealth: Payer: Medicare PPO | Admitting: Physician Assistant

## 2021-09-13 DIAGNOSIS — J45901 Unspecified asthma with (acute) exacerbation: Secondary | ICD-10-CM | POA: Diagnosis not present

## 2021-09-13 DIAGNOSIS — J209 Acute bronchitis, unspecified: Secondary | ICD-10-CM | POA: Diagnosis not present

## 2021-09-13 MED ORDER — DOXYCYCLINE HYCLATE 100 MG PO TABS: 100.0000 mg | ORAL_TABLET | Freq: Two times a day (BID) | ORAL | 0 refills | Status: DC

## 2021-09-13 MED ORDER — BENZONATATE 100 MG PO CAPS: 100.0000 mg | ORAL_CAPSULE | Freq: Three times a day (TID) | ORAL | 0 refills | Status: DC | PRN

## 2021-09-13 MED ORDER — PREDNISONE 20 MG PO TABS: 40.0000 mg | ORAL_TABLET | Freq: Every day | ORAL | 0 refills | Status: AC

## 2021-09-13 NOTE — Progress Notes (Signed)

## 2021-09-13 NOTE — Progress Notes (Signed)
I have spent 5 minutes in review of e-visit questionnaire, review and updating patient chart, medical decision making and response to patient.   Karliah Kowalchuk Cody Steve Youngberg, PA-C    

## 2021-09-20 ENCOUNTER — Encounter: Payer: Self-pay | Admitting: Internal Medicine

## 2021-09-20 NOTE — Patient Instructions (Addendum)
     Blood work was ordered.     Medications changes include :   increase crestor 10 mg daily   Your prescription(s) have been sent to your pharmacy.     Return in about 4 months (around 01/21/2022) for follow up.    Consider having a CT scan to evaluate the plaque build up in your heart - this is out of pocket cost of $99.

## 2021-09-20 NOTE — Progress Notes (Unsigned)
Subjective:    Patient ID: Dalton Murphy, male    DOB: [birth year], 71 y.o.   MRN: [id number]     HPI Dalton Murphy is here for follow up of his chronic medical problems, including DM, hypothyroid, hld  He is exercising regularly.   He is taking all of his medications as prescribed.    He is having allergy symptoms.  Medications and allergies reviewed with patient and updated if appropriate.  Current Outpatient Medications on File Prior to Visit  Medication Sig Dispense Refill   albuterol (VENTOLIN HFA) 108 (90 Base) MCG/ACT inhaler INHALE 2 PUFFS BY MOUTH EVERY 6 HOURS AS NEEDED FOR WHEEZING FOR SHORTNESS OF BREATH 18 g 5   diclofenac (VOLTAREN) 75 MG EC tablet Take 1 tablet by mouth twice daily as needed 60 tablet 5   fluticasone (FLONASE) 50 MCG/ACT nasal spray Use 2 spray(s) in each nostril once daily 16 g 5   levothyroxine (SYNTHROID) 200 MCG tablet TAKE 1 TABLET BY MOUTH ONCE DAILY BEFORE BREAKFAST 90 tablet 0   rosuvastatin (CRESTOR) 5 MG tablet Take 1 tablet (5 mg total) by mouth daily. 90 tablet 3   RYBELSUS 7 MG TABS Take 1 tablet by mouth once daily 30 tablet 0   No current facility-administered medications on file prior to visit.     Review of Systems  Constitutional:  Negative for fever.  HENT:  Positive for congestion.   Respiratory:  Positive for cough (allergy related), chest tightness (occ - improved with inhaler) and shortness of breath (chronic -improved with weight loss). Negative for wheezing.   Cardiovascular:  Negative for chest pain, palpitations and leg swelling.  Neurological:  Positive for light-headedness (occ). Negative for headaches.      Objective:   Vitals:   09/21/21 1125  BP: 126/72  Pulse: 76  Temp: 98 F (36.7 C)  SpO2: 94%   BP Readings from Last 3 Encounters:  09/21/21 126/72  06/29/21 130/68  03/30/21 (!) 148/88   Wt Readings from Last 3 Encounters:  09/21/21 269 lb (122 kg)  06/29/21 274 lb (124.3 kg)  03/30/21 284  lb (128.8 kg)   Body mass index is 37.52 kg/m.    Physical Exam Constitutional:      General: He is not in acute distress.    Appearance: Normal appearance. He is not ill-appearing.  HENT:     Head: Normocephalic and atraumatic.  Eyes:     Conjunctiva/sclera: Conjunctivae normal.  Cardiovascular:     Rate and Rhythm: Normal rate and regular rhythm.     Heart sounds: Normal heart sounds. No murmur heard. Pulmonary:     Effort: Pulmonary effort is normal. No respiratory distress.     Breath sounds: Normal breath sounds. No wheezing or rales.  Musculoskeletal:     Right lower leg: No edema.     Left lower leg: No edema.  Skin:    General: Skin is warm and dry.     Findings: No rash.  Neurological:     Mental Status: He is alert. Mental status is at baseline.  Psychiatric:        Mood and Affect: Mood normal.       Lab Results  Component Value Date   WBC 7.0 03/30/2021   HGB 15.1 03/30/2021   HCT 44.5 03/30/2021   PLT 218.0 03/30/2021   GLUCOSE 121 (H) 06/29/2021   CHOL 168 03/30/2021   TRIG 239.0 (H) 03/30/2021   HDL 40.50 03/30/2021  LDLDIRECT 113.0 03/30/2021   LDLCALC 89 10/10/2018   ALT 46 06/29/2021   AST 30 06/29/2021   NA 137 06/29/2021   K 5.0 06/29/2021   CL 100 06/29/2021   CREATININE 1.09 06/29/2021   BUN 23 06/29/2021   CO2 29 06/29/2021   TSH 1.83 03/30/2021   PSA 2.05 09/12/2017   INR 1.0 06/19/2018   HGBA1C 6.5 06/29/2021   MICROALBUR 5.0 (H) 03/30/2021     Assessment & Plan:    Multiple risk factors for CAD - discussed considering Ct coronary calcium score.    See Problem List for Assessment and Plan of chronic medical problems.

## 2021-09-21 ENCOUNTER — Ambulatory Visit: Payer: Medicare PPO | Admitting: Internal Medicine

## 2021-09-21 VITALS — BP 126/72 | HR 76 | Temp 98.0°F | Ht 71.0 in | Wt 269.0 lb

## 2021-09-21 DIAGNOSIS — E038 Other specified hypothyroidism: Secondary | ICD-10-CM | POA: Diagnosis not present

## 2021-09-21 DIAGNOSIS — E1169 Type 2 diabetes mellitus with other specified complication: Secondary | ICD-10-CM

## 2021-09-21 DIAGNOSIS — E119 Type 2 diabetes mellitus without complications: Secondary | ICD-10-CM | POA: Diagnosis not present

## 2021-09-21 DIAGNOSIS — E785 Hyperlipidemia, unspecified: Secondary | ICD-10-CM | POA: Diagnosis not present

## 2021-09-21 LAB — HEMOGLOBIN A1C: Hgb A1c MFr Bld: 6.8 % — ABNORMAL HIGH (ref 4.6–6.5)

## 2021-09-21 LAB — COMPREHENSIVE METABOLIC PANEL
ALT: 39 U/L (ref 0–53)
AST: 20 U/L (ref 0–37)
Albumin: 4.3 g/dL (ref 3.5–5.2)
Alkaline Phosphatase: 72 U/L (ref 39–117)
BUN: 21 mg/dL (ref 6–23)
CO2: 26 mEq/L (ref 19–32)
Calcium: 9.4 mg/dL (ref 8.4–10.5)
Chloride: 101 mEq/L (ref 96–112)
Creatinine, Ser: 0.82 mg/dL (ref 0.40–1.50)
GFR: 88.67 mL/min (ref >60.00)
Glucose, Bld: 127 mg/dL — ABNORMAL HIGH (ref 70–99)
Potassium: 4.1 mEq/L (ref 3.5–5.1)
Sodium: 136 mEq/L (ref 135–145)
Total Bilirubin: 3.5 mg/dL — ABNORMAL HIGH (ref 0.2–1.2)
Total Protein: 7.2 g/dL (ref 6.0–8.3)

## 2021-09-21 LAB — LIPID PANEL
Cholesterol: 136 mg/dL (ref 0–200)
HDL: 58 mg/dL (ref >39.00)
LDL Cholesterol: 41 mg/dL (ref 0–99)
NonHDL: 77.78
Total CHOL/HDL Ratio: 2
Triglycerides: 184 mg/dL — ABNORMAL HIGH (ref 0.0–149.0)
VLDL: 36.8 mg/dL (ref 0.0–40.0)

## 2021-09-21 MED ORDER — ROSUVASTATIN CALCIUM 10 MG PO TABS: 10.0000 mg | ORAL_TABLET | Freq: Every day | ORAL | 3 refills | Status: DC

## 2021-09-21 NOTE — Assessment & Plan Note (Addendum)
Chronic  Lab Results  Component Value Date   HGBA1C 6.5 06/29/2021   Sugars controlled Check A1c Continue rybelsus 7 mg daily Stressed regular exercise, diabetic diet Continue weight loss efforts

## 2021-09-21 NOTE — Assessment & Plan Note (Addendum)
Chronic Check lipid panel  Continue crestor but increase to 10 mg daily Regular exercise and healthy diet encouraged continue weight loss efforts

## 2021-09-21 NOTE — Assessment & Plan Note (Signed)
Chronic  Clinically euthyroid Currently taking levothyroxine 200 mcg daily Check tsh  Titrate med dose if needed

## 2021-09-27 ENCOUNTER — Other Ambulatory Visit: Payer: Self-pay | Admitting: Internal Medicine

## 2021-10-20 DIAGNOSIS — H5203 Hypermetropia, bilateral: Secondary | ICD-10-CM | POA: Diagnosis not present

## 2021-10-20 DIAGNOSIS — H40051 Ocular hypertension, right eye: Secondary | ICD-10-CM | POA: Diagnosis not present

## 2021-10-20 DIAGNOSIS — E119 Type 2 diabetes mellitus without complications: Secondary | ICD-10-CM | POA: Diagnosis not present

## 2021-10-20 DIAGNOSIS — H02831 Dermatochalasis of right upper eyelid: Secondary | ICD-10-CM | POA: Diagnosis not present

## 2021-10-20 LAB — HM DIABETES EYE EXAM

## 2021-10-24 ENCOUNTER — Encounter: Payer: Self-pay | Admitting: Internal Medicine

## 2021-10-24 NOTE — Progress Notes (Signed)
Outside notes received. Information abstracted. Notes sent to scan.  

## 2021-10-26 ENCOUNTER — Other Ambulatory Visit: Payer: Self-pay | Admitting: Internal Medicine

## 2021-11-24 ENCOUNTER — Other Ambulatory Visit: Payer: Self-pay | Admitting: Internal Medicine

## 2021-12-07 ENCOUNTER — Other Ambulatory Visit: Payer: Self-pay | Admitting: Internal Medicine

## 2021-12-23 ENCOUNTER — Telehealth: Payer: Self-pay | Admitting: Internal Medicine

## 2021-12-23 NOTE — Telephone Encounter (Signed)
Left message for patient to call back to schedule Medicare Annual Wellness Visit   Last AWV  12/28/21  Please schedule at anytime with [facility] if patient calls the office back.    Any questions, please call me at [phone]

## 2022-01-03 ENCOUNTER — Ambulatory Visit: Payer: Medicare PPO

## 2022-01-05 ENCOUNTER — Telehealth: Payer: Medicare PPO | Admitting: Family Medicine

## 2022-01-05 DIAGNOSIS — B9689 Other specified bacterial agents as the cause of diseases classified elsewhere: Secondary | ICD-10-CM | POA: Diagnosis not present

## 2022-01-05 DIAGNOSIS — J019 Acute sinusitis, unspecified: Secondary | ICD-10-CM | POA: Diagnosis not present

## 2022-01-05 MED ORDER — AMOXICILLIN-POT CLAVULANATE 875-125 MG PO TABS: 1.0000 | ORAL_TABLET | Freq: Two times a day (BID) | ORAL | 0 refills | Status: AC

## 2022-01-05 NOTE — Progress Notes (Signed)

## 2022-01-10 ENCOUNTER — Ambulatory Visit: Payer: Medicare PPO

## 2022-01-17 ENCOUNTER — Telehealth: Payer: Self-pay

## 2022-01-17 ENCOUNTER — Ambulatory Visit (INDEPENDENT_AMBULATORY_CARE_PROVIDER_SITE_OTHER): Payer: Medicare PPO

## 2022-01-17 VITALS — Ht 71.0 in

## 2022-01-17 DIAGNOSIS — Z0000 Encounter for general adult medical examination without abnormal findings: Secondary | ICD-10-CM | POA: Diagnosis not present

## 2022-01-17 NOTE — Telephone Encounter (Signed)
AWV-S was completed on 01/17/2022 at 2:00 pm.
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
## 2022-01-17 NOTE — Patient Instructions (Signed)
Dalton Murphy , Thank you for taking time to come for your Medicare Wellness Visit. I appreciate your ongoing commitment to your health goals. Please review the following plan we discussed and let me know if I can assist you in the future.   These are the goals we discussed:  Goals      Stay healthy, exercise more and eat healthier.        This is a list of the screening recommended for you and due dates:  Health Maintenance  Topic Date Due   Complete foot exam   Never done   COVID-19 Vaccine (3 - Pfizer series) 07/23/2019   Flu Shot  11/15/2021   Hemoglobin A1C  03/23/2022   Yearly kidney health urinalysis for diabetes  03/30/2022   Yearly kidney function blood test for diabetes  09/22/2022   Eye exam for diabetics  10/21/2022   Tetanus Vaccine  12/13/2024   Colon Cancer Screening  08/11/2025   Pneumonia Vaccine  Completed   Hepatitis C Screening: USPSTF Recommendation to screen - Ages 71-79 yo.  Completed   Zoster (Shingles) Vaccine  Completed   HPV Vaccine  Aged Out    Advanced directives: YES  Conditions/risks identified: YES  Next appointment: Follow up in one year for your annual wellness visit.   Preventive Care 71 Years and Older, Male  Preventive care refers to lifestyle choices and visits with your health care provider that can promote health and wellness. What does preventive care include? A yearly physical exam. This is also called an annual well check. Dental exams once or twice a year. Routine eye exams. Ask your health care provider how often you should have your eyes checked. Personal lifestyle choices, including: Daily care of your teeth and gums. Regular physical activity. Eating a healthy diet. Avoiding tobacco and drug use. Limiting alcohol use. Practicing safe sex. Taking low doses of aspirin every day. Taking vitamin and mineral supplements as recommended by your health care provider. What happens during an annual well check? The services and  screenings done by your health care provider during your annual well check will depend on your age, overall health, lifestyle risk factors, and family history of disease. Counseling  Your health care provider may ask you questions about your: Alcohol use. Tobacco use. Drug use. Emotional well-being. Home and relationship well-being. Sexual activity. Eating habits. History of falls. Memory and ability to understand (cognition). Work and work Statistician. Screening  You may have the following tests or measurements: Height, weight, and BMI. Blood pressure. Lipid and cholesterol levels. These may be checked every 5 years, or more frequently if you are over 2 years old. Skin check. Lung cancer screening. You may have this screening every year starting at age 17 if you have a 30-pack-year history of smoking and currently smoke or have quit within the past 15 years. Fecal occult blood test (FOBT) of the stool. You may have this test every year starting at age 10. Flexible sigmoidoscopy or colonoscopy. You may have a sigmoidoscopy every 5 years or a colonoscopy every 10 years starting at age 38. Prostate cancer screening. Recommendations will vary depending on your family history and other risks. Hepatitis C blood test. Hepatitis B blood test. Sexually transmitted disease (STD) testing. Diabetes screening. This is done by checking your blood sugar (glucose) after you have not eaten for a while (fasting). You may have this done every 1-3 years. Abdominal aortic aneurysm (AAA) screening. You may need this if you are a current  or former smoker. Osteoporosis. You may be screened starting at age 21 if you are at high risk. Talk with your health care provider about your test results, treatment options, and if necessary, the need for more tests. Vaccines  Your health care provider may recommend certain vaccines, such as: Influenza vaccine. This is recommended every year. Tetanus, diphtheria, and  acellular pertussis (Tdap, Td) vaccine. You may need a Td booster every 10 years. Zoster vaccine. You may need this after age 19. Pneumococcal 13-valent conjugate (PCV13) vaccine. One dose is recommended after age 28. Pneumococcal polysaccharide (PPSV23) vaccine. One dose is recommended after age 62. Talk to your health care provider about which screenings and vaccines you need and how often you need them. This information is not intended to replace advice given to you by your health care provider. Make sure you discuss any questions you have with your health care provider. Document Released: 04/30/2015 Document Revised: 12/22/2015 Document Reviewed: 02/02/2015 Elsevier Interactive Patient Education  2017 [place] Prevention in the Home Falls can cause injuries. They can happen to people of all ages. There are many things you can do to make your home safe and to help prevent falls. What can I do on the outside of my home? Regularly fix the edges of walkways and driveways and fix any cracks. Remove anything that might make you trip as you walk through a door, such as a raised step or threshold. Trim any bushes or trees on the path to your home. Use bright outdoor lighting. Clear any walking paths of anything that might make someone trip, such as rocks or tools. Regularly check to see if handrails are loose or broken. Make sure that both sides of any steps have handrails. Any raised decks and porches should have guardrails on the edges. Have any leaves, snow, or ice cleared regularly. Use sand or salt on walking paths during winter. Clean up any spills in your garage right away. This includes oil or grease spills. What can I do in the bathroom? Use night lights. Install grab bars by the toilet and in the tub and shower. Do not use towel bars as grab bars. Use non-skid mats or decals in the tub or shower. If you need to sit down in the shower, use a plastic, non-slip stool. Keep  the floor dry. Clean up any water that spills on the floor as soon as it happens. Remove soap buildup in the tub or shower regularly. Attach bath mats securely with double-sided non-slip rug tape. Do not have throw rugs and other things on the floor that can make you trip. What can I do in the bedroom? Use night lights. Make sure that you have a light by your bed that is easy to reach. Do not use any sheets or blankets that are too big for your bed. They should not hang down onto the floor. Have a firm chair that has side arms. You can use this for support while you get dressed. Do not have throw rugs and other things on the floor that can make you trip. What can I do in the kitchen? Clean up any spills right away. Avoid walking on wet floors. Keep items that you use a lot in easy-to-reach places. If you need to reach something above you, use a strong step stool that has a grab bar. Keep electrical cords out of the way. Do not use floor polish or wax that makes floors slippery. If you must use wax,  use non-skid floor wax. Do not have throw rugs and other things on the floor that can make you trip. What can I do with my stairs? Do not leave any items on the stairs. Make sure that there are handrails on both sides of the stairs and use them. Fix handrails that are broken or loose. Make sure that handrails are as long as the stairways. Check any carpeting to make sure that it is firmly attached to the stairs. Fix any carpet that is loose or worn. Avoid having throw rugs at the top or bottom of the stairs. If you do have throw rugs, attach them to the floor with carpet tape. Make sure that you have a light switch at the top of the stairs and the bottom of the stairs. If you do not have them, ask someone to add them for you. What else can I do to help prevent falls? Wear shoes that: Do not have high heels. Have rubber bottoms. Are comfortable and fit you well. Are closed at the toe. Do not  wear sandals. If you use a stepladder: Make sure that it is fully opened. Do not climb a closed stepladder. Make sure that both sides of the stepladder are locked into place. Ask someone to hold it for you, if possible. Clearly mark and make sure that you can see: Any grab bars or handrails. First and last steps. Where the edge of each step is. Use tools that help you move around (mobility aids) if they are needed. These include: Canes. Walkers. Scooters. Crutches. Turn on the lights when you go into a dark area. Replace any light bulbs as soon as they burn out. Set up your furniture so you have a clear path. Avoid moving your furniture around. If any of your floors are uneven, fix them. If there are any pets around you, be aware of where they are. Review your medicines with your doctor. Some medicines can make you feel dizzy. This can increase your chance of falling. Ask your doctor what other things that you can do to help prevent falls. This information is not intended to replace advice given to you by your health care provider. Make sure you discuss any questions you have with your health care provider. Document Released: 01/28/2009 Document Revised: 09/09/2015 Document Reviewed: 05/08/2014 Elsevier Interactive Patient Education  2017 [employer].

## 2022-01-25 ENCOUNTER — Ambulatory Visit: Payer: Medicare PPO | Admitting: Internal Medicine

## 2022-02-07 ENCOUNTER — Encounter: Payer: Self-pay | Admitting: Internal Medicine

## 2022-02-07 NOTE — Patient Instructions (Addendum)
     Flu immunization administered today.     Blood work was ordered.   Have a chest xray today.     Medications changes include :   Trelegy inhaler once a day - rinse your mouth after    An Echocardiogram or ultrasound of your heart was ordered.    Return in about 3 months (around 05/11/2022) for Physical Exam.

## 2022-02-07 NOTE — Progress Notes (Unsigned)
Subjective:    Patient ID: Dalton Murphy, male    DOB: 04/04/1951, 71 y.o.   MRN: [id number]     HPI Dalton Murphy is here for follow up of his chronic medical problems, including DM, hypothyroid, hld, DOE  He still has SOB with exertion.  He does cough up thick mucus.  He states wheezing at times.  The albuterol helps.  He takes it at least once a day.     He is doing some yard work.  No regular exercise.    Medications and allergies reviewed with patient and updated if appropriate.  Current Outpatient Medications on File Prior to Visit  Medication Sig Dispense Refill   diclofenac (VOLTAREN) 75 MG EC tablet Take 1 tablet by mouth twice daily as needed 60 tablet 5   fluticasone (FLONASE) 50 MCG/ACT nasal spray Use 2 spray(s) in each nostril once daily 16 g 5   levothyroxine (SYNTHROID) 200 MCG tablet TAKE 1 TABLET BY MOUTH ONCE DAILY BEFORE BREAKFAST 90 tablet 0   rosuvastatin (CRESTOR) 10 MG tablet Take 1 tablet (10 mg total) by mouth daily. 90 tablet 3   Semaglutide (RYBELSUS) 7 MG TABS Take 1 tablet by mouth once daily 30 tablet 5   No current facility-administered medications on file prior to visit.     Review of Systems  Constitutional:  Negative for fever.  Respiratory:  Positive for cough, shortness of breath and wheezing.   Cardiovascular:  Negative for chest pain, palpitations and leg swelling.  Gastrointestinal:        Rare gerd  Genitourinary:  Positive for frequency and urgency.  Neurological:  Positive for light-headedness (occ). Negative for headaches.       Objective:   Vitals:   02/08/22 1034  BP: 118/68  Pulse: 68  Temp: 98 F (36.7 C)  SpO2: 94%   BP Readings from Last 3 Encounters:  02/08/22 118/68  09/21/21 126/72  06/29/21 130/68   Wt Readings from Last 3 Encounters:  02/08/22 262 lb (118.8 kg)  09/21/21 269 lb (122 kg)  06/29/21 274 lb (124.3 kg)   Body mass index is 36.54 kg/m.    Physical Exam Constitutional:       General: He is not in acute distress.    Appearance: Normal appearance. He is not ill-appearing.  HENT:     Head: Normocephalic and atraumatic.  Eyes:     Conjunctiva/sclera: Conjunctivae normal.  Cardiovascular:     Rate and Rhythm: Normal rate and regular rhythm.     Heart sounds: Normal heart sounds. No murmur heard. Pulmonary:     Effort: Pulmonary effort is normal. No respiratory distress.     Breath sounds: Wheezing (Fine, expiratory) present. No rales.  Musculoskeletal:     Right lower leg: No edema.     Left lower leg: No edema.  Skin:    General: Skin is warm and dry.     Findings: No rash.  Neurological:     Mental Status: He is alert. Mental status is at baseline.  Psychiatric:        Mood and Affect: Mood normal.        Lab Results  Component Value Date   WBC 7.0 03/30/2021   HGB 15.1 03/30/2021   HCT 44.5 03/30/2021   PLT 218.0 03/30/2021   GLUCOSE 127 (H) 09/21/2021   CHOL 136 09/21/2021   TRIG 184.0 (H) 09/21/2021   HDL 58.00 09/21/2021   LDLDIRECT 113.0 03/30/2021   [place]  41 09/21/2021   ALT 39 09/21/2021   AST 20 09/21/2021   NA 136 09/21/2021   K 4.1 09/21/2021   CL 101 09/21/2021   CREATININE 0.82 09/21/2021   BUN 21 09/21/2021   CO2 26 09/21/2021   TSH 1.83 03/30/2021   PSA 2.05 09/12/2017   INR 1.0 06/19/2018   HGBA1C 6.8 (H) 09/21/2021   MICROALBUR 5.0 (H) 03/30/2021     Assessment & Plan:    See Problem List for Assessment and Plan of chronic medical problems.

## 2022-02-08 ENCOUNTER — Ambulatory Visit: Payer: Medicare PPO | Admitting: Internal Medicine

## 2022-02-08 ENCOUNTER — Ambulatory Visit (INDEPENDENT_AMBULATORY_CARE_PROVIDER_SITE_OTHER): Payer: Medicare PPO

## 2022-02-08 VITALS — BP 118/68 | HR 68 | Temp 98.0°F | Ht 71.0 in | Wt 262.0 lb

## 2022-02-08 DIAGNOSIS — E038 Other specified hypothyroidism: Secondary | ICD-10-CM | POA: Diagnosis not present

## 2022-02-08 DIAGNOSIS — E1169 Type 2 diabetes mellitus with other specified complication: Secondary | ICD-10-CM | POA: Diagnosis not present

## 2022-02-08 DIAGNOSIS — R3915 Urgency of urination: Secondary | ICD-10-CM

## 2022-02-08 DIAGNOSIS — R0609 Other forms of dyspnea: Secondary | ICD-10-CM

## 2022-02-08 DIAGNOSIS — R0600 Dyspnea, unspecified: Secondary | ICD-10-CM | POA: Diagnosis not present

## 2022-02-08 DIAGNOSIS — R059 Cough, unspecified: Secondary | ICD-10-CM | POA: Diagnosis not present

## 2022-02-08 DIAGNOSIS — Z23 Encounter for immunization: Secondary | ICD-10-CM | POA: Diagnosis not present

## 2022-02-08 DIAGNOSIS — E119 Type 2 diabetes mellitus without complications: Secondary | ICD-10-CM

## 2022-02-08 DIAGNOSIS — G4733 Obstructive sleep apnea (adult) (pediatric): Secondary | ICD-10-CM

## 2022-02-08 DIAGNOSIS — E785 Hyperlipidemia, unspecified: Secondary | ICD-10-CM | POA: Diagnosis not present

## 2022-02-08 LAB — COMPREHENSIVE METABOLIC PANEL
ALT: 42 U/L (ref 0–53)
AST: 26 U/L (ref 0–37)
Albumin: 4.2 g/dL (ref 3.5–5.2)
Alkaline Phosphatase: 69 U/L (ref 39–117)
BUN: 14 mg/dL (ref 6–23)
CO2: 25 mEq/L (ref 19–32)
Calcium: 8.9 mg/dL (ref 8.4–10.5)
Chloride: 104 mEq/L (ref 96–112)
Creatinine, Ser: 0.68 mg/dL (ref 0.40–1.50)
GFR: 93.57 mL/min (ref >60.00)
Glucose, Bld: 148 mg/dL — ABNORMAL HIGH (ref 70–99)
Potassium: 4 mEq/L (ref 3.5–5.1)
Sodium: 137 mEq/L (ref 135–145)
Total Bilirubin: 1.8 mg/dL — ABNORMAL HIGH (ref 0.2–1.2)
Total Protein: 7.1 g/dL (ref 6.0–8.3)

## 2022-02-08 LAB — TSH: TSH: 0.49 u[IU]/mL (ref 0.35–5.50)

## 2022-02-08 LAB — HEMOGLOBIN A1C: Hgb A1c MFr Bld: 6.7 % — ABNORMAL HIGH (ref 4.6–6.5)

## 2022-02-08 MED ORDER — TRELEGY ELLIPTA 100-62.5-25 MCG/ACT IN AEPB: 1.0000 | INHALATION_SPRAY | Freq: Every day | RESPIRATORY_TRACT | 11 refills | Status: DC

## 2022-02-08 MED ORDER — ALBUTEROL SULFATE HFA 108 (90 BASE) MCG/ACT IN AERS: INHALATION_SPRAY | RESPIRATORY_TRACT | 5 refills | Status: AC

## 2022-02-08 NOTE — Assessment & Plan Note (Signed)
Chronic Check lipid panel  Continue crestor 10 mg daily Regular exercise and healthy diet encouraged  

## 2022-02-08 NOTE — Assessment & Plan Note (Signed)
Chronic  Clinically euthyroid Check tsh and will titrate med dose if needed Currently taking levothyroxine 200 mcg daily 

## 2022-02-08 NOTE — Assessment & Plan Note (Addendum)
Chronic Has experiencing dyspnea exertion, coughing and some wheezing Albuterol has been helpful, but he is using it on a regular basis Probable emphysema versus bronchitis Chest x-ray today-previous chest x-ray did indicate emphysema Echocardiogram ordered Start Trelegy 100-62.5-25 mcg/ACT daily-rinse after use We will hold off on pulmonary referral at this time Stressed regular walking Encouraged weight loss Follow-up in 3 months, sooner if needed

## 2022-02-08 NOTE — Assessment & Plan Note (Signed)
Chronic Uses cpap nightly

## 2022-02-08 NOTE — Assessment & Plan Note (Signed)
Chronic Has urgency and frequency Likely BPH Discussed going on a medication such as Flomax or seeing urology-he will think about this and we can follow-up at his next visit or sooner if needed

## 2022-02-08 NOTE — Assessment & Plan Note (Signed)
Chronic  Lab Results  Component Value Date   HGBA1C 6.8 (H) 09/21/2021   Sugars well controlled Check A1c Continue rybelsus 7 mg daily Stressed regular exercise, diabetic diet

## 2022-03-07 ENCOUNTER — Other Ambulatory Visit: Payer: Self-pay | Admitting: Internal Medicine

## 2022-03-16 ENCOUNTER — Telehealth: Payer: Medicare PPO | Admitting: Family Medicine

## 2022-03-16 ENCOUNTER — Other Ambulatory Visit: Payer: Self-pay | Admitting: Internal Medicine

## 2022-03-16 ENCOUNTER — Telehealth (HOSPITAL_BASED_OUTPATIENT_CLINIC_OR_DEPARTMENT_OTHER): Payer: Self-pay | Admitting: *Deleted

## 2022-03-16 DIAGNOSIS — B9689 Other specified bacterial agents as the cause of diseases classified elsewhere: Secondary | ICD-10-CM

## 2022-03-16 DIAGNOSIS — J019 Acute sinusitis, unspecified: Secondary | ICD-10-CM | POA: Diagnosis not present

## 2022-03-16 MED ORDER — AMOXICILLIN-POT CLAVULANATE 875-125 MG PO TABS: 1.0000 | ORAL_TABLET | Freq: Two times a day (BID) | ORAL | 0 refills | Status: AC

## 2022-03-16 MED ORDER — BENZONATATE 100 MG PO CAPS: 100.0000 mg | ORAL_CAPSULE | Freq: Three times a day (TID) | ORAL | 0 refills | Status: DC | PRN

## 2022-03-16 NOTE — Telephone Encounter (Signed)
We have attempted to contact the ordering providers office to obtain a prior authorization for the ordered test.  However, we have been unsuccesful. Order will be removed from the active order WQ. Once the prior authorization is obtained we will reinstate the order and schedule patient.   Thank you

## 2022-03-16 NOTE — Progress Notes (Signed)
E-Visit for Sinus Problems  We are sorry that you are not feeling well.  Here is how we plan to help!  Based on what you have shared with me it looks like you have sinusitis.  Sinusitis is inflammation and infection in the sinus cavities of the head.  Based on your presentation I believe you most likely have Acute Bacterial Sinusitis.  This is an infection caused by bacteria and is treated with antibiotics. I have prescribed Augmentin '875mg'$ /'125mg'$  one tablet twice daily with food, for 7 days. You may use an oral decongestant such as Mucinex D or if you have glaucoma or high blood pressure use plain Mucinex. Saline nasal spray help and can safely be used as often as needed for congestion.  If you develop worsening sinus pain, fever or notice severe headache and vision changes, or if symptoms are not better after completion of antibiotic, please schedule an appointment with a health care provider.    I also sent in a cough pill- tessalon perles.  Sinus infections are not as easily transmitted as other respiratory infection, however we still recommend that you avoid close contact with loved ones, especially the very young and elderly.  Remember to wash your hands thoroughly throughout the day as this is the number one way to prevent the spread of infection!  Home Care: Only take medications as instructed by your medical team. Complete the entire course of an antibiotic. Do not take these medications with alcohol. A steam or ultrasonic humidifier can help congestion.  You can place a towel over your head and breathe in the steam from hot water coming from a faucet. Avoid close contacts especially the very young and the elderly. Cover your mouth when you cough or sneeze. Always remember to wash your hands.  Get Help Right Away If: You develop worsening fever or sinus pain. You develop a severe head ache or visual changes. Your symptoms persist after you have completed your treatment plan.  Make  sure you Understand these instructions. Will watch your condition. Will get help right away if you are not doing well or get worse.  Thank you for choosing an e-visit.  Your e-visit answers were reviewed by a board certified advanced clinical practitioner to complete your personal care plan. Depending upon the condition, your plan could have included both over the counter or prescription medications.  Please review your pharmacy choice. Make sure the pharmacy is open so you can pick up prescription now. If there is a problem, you may contact your provider through [employer] and have the prescription routed to another pharmacy.  Your safety is important to [place]. If you have drug allergies check your prescription carefully.   For the next 24 hours you can use MyChart to ask questions about today's visit, request a non-urgent call back, or ask for a work or school excuse. You will get an email in the next two days asking about your experience. I hope that your e-visit has been valuable and will speed your recovery.  I provided 5 minutes of non face-to-face time during this encounter for chart review, medication and order placement, as well as and documentation.

## 2022-04-17 ENCOUNTER — Other Ambulatory Visit: Payer: Self-pay | Admitting: Internal Medicine

## 2022-04-27 DIAGNOSIS — H40053 Ocular hypertension, bilateral: Secondary | ICD-10-CM | POA: Diagnosis not present

## 2022-04-30 ENCOUNTER — Telehealth: Payer: Medicare PPO | Admitting: Physician Assistant

## 2022-04-30 DIAGNOSIS — B9689 Other specified bacterial agents as the cause of diseases classified elsewhere: Secondary | ICD-10-CM

## 2022-04-30 DIAGNOSIS — J019 Acute sinusitis, unspecified: Secondary | ICD-10-CM

## 2022-04-30 MED ORDER — AMOXICILLIN-POT CLAVULANATE 875-125 MG PO TABS: 1.0000 | ORAL_TABLET | Freq: Two times a day (BID) | ORAL | 0 refills | Status: DC

## 2022-04-30 NOTE — Progress Notes (Signed)

## 2022-05-08 ENCOUNTER — Other Ambulatory Visit: Payer: Self-pay | Admitting: Internal Medicine

## 2022-05-09 ENCOUNTER — Other Ambulatory Visit: Payer: Self-pay

## 2022-05-16 ENCOUNTER — Encounter: Payer: Self-pay | Admitting: Internal Medicine

## 2022-05-16 NOTE — Patient Instructions (Addendum)
Blood work was ordered.   The lab is on the first floor.    Medications changes include :   increase Rybelsus 14 mg daily     Return in about 6 months (around 11/15/2022) for follow up.   Health Maintenance, Male Adopting a healthy lifestyle and getting preventive care are important in promoting health and wellness. Ask your health care provider about: The right schedule for you to have regular tests and exams. Things you can do on your own to prevent diseases and keep yourself healthy. What should I know about diet, weight, and exercise? Eat a healthy diet  Eat a diet that includes plenty of vegetables, fruits, low-fat dairy products, and lean protein. Do not eat a lot of foods that are high in solid fats, added sugars, or sodium. Maintain a healthy weight Body mass index (BMI) is a measurement that can be used to identify possible weight problems. It estimates body fat based on height and weight. Your health care provider can help determine your BMI and help you achieve or maintain a healthy weight. Get regular exercise Get regular exercise. This is one of the most important things you can do for your health. Most adults should: Exercise for at least 150 minutes each week. The exercise should increase your heart rate and make you sweat (moderate-intensity exercise). Do strengthening exercises at least twice a week. This is in addition to the moderate-intensity exercise. Spend less time sitting. Even light physical activity can be beneficial. Watch cholesterol and blood lipids Have your blood tested for lipids and cholesterol at 72 years of age, then have this test every 5 years. You may need to have your cholesterol levels checked more often if: Your lipid or cholesterol levels are high. You are older than 72 years of age. You are at high risk for heart disease. What should I know about cancer screening? Many types of cancers can be detected early and may often be  prevented. Depending on your health history and family history, you may need to have cancer screening at various ages. This may include screening for: Colorectal cancer. Prostate cancer. Skin cancer. Lung cancer. What should I know about heart disease, diabetes, and high blood pressure? Blood pressure and heart disease High blood pressure causes heart disease and increases the risk of stroke. This is more likely to develop in people who have high blood pressure readings or are overweight. Talk with your health care provider about your target blood pressure readings. Have your blood pressure checked: Every 3-5 years if you are 36-36 years of age. Every year if you are 54 years old or older. If you are between the ages of 3 and 58 and are a current or former smoker, ask your health care provider if you should have a one-time screening for abdominal aortic aneurysm (AAA). Diabetes Have regular diabetes screenings. This checks your fasting blood sugar level. Have the screening done: Once every three years after age 33 if you are at a normal weight and have a low risk for diabetes. More often and at a younger age if you are overweight or have a high risk for diabetes. What should I know about preventing infection? Hepatitis B If you have a higher risk for hepatitis B, you should be screened for this virus. Talk with your health care provider to find out if you are at risk for hepatitis B infection. Hepatitis C Blood testing is recommended for: Everyone born from [birth year] through 1965.  Anyone with known risk factors for hepatitis C. Sexually transmitted infections (STIs) You should be screened each year for STIs, including gonorrhea and chlamydia, if: You are sexually active and are younger than 72 years of age. You are older than 72 years of age and your health care provider tells you that you are at risk for this type of infection. Your sexual activity has changed since you were last screened,  and you are at increased risk for chlamydia or gonorrhea. Ask your health care provider if you are at risk. Ask your health care provider about whether you are at high risk for HIV. Your health care provider may recommend a prescription medicine to help prevent HIV infection. If you choose to take medicine to prevent HIV, you should first get tested for HIV. You should then be tested every 3 months for as long as you are taking the medicine. Follow these instructions at home: Alcohol use Do not drink alcohol if your health care provider tells you not to drink. If you drink alcohol: Limit how much you have to 0-2 drinks a day. Know how much alcohol is in your drink. In the [place] one drink equals one 12 oz bottle of beer (355 mL), one 5 oz glass of wine (148 mL), or one 1 oz glass of hard liquor (44 mL). Lifestyle Do not use any products that contain nicotine or tobacco. These products include cigarettes, chewing tobacco, and vaping devices, such as e-cigarettes. If you need help quitting, ask your health care provider. Do not use street drugs. Do not share needles. Ask your health care provider for help if you need support or information about quitting drugs. General instructions Schedule regular health, dental, and eye exams. Stay current with your vaccines. Tell your health care provider if: You often feel depressed. You have ever been abused or do not feel safe at home. Summary Adopting a healthy lifestyle and getting preventive care are important in promoting health and wellness. Follow your health care provider's instructions about healthy diet, exercising, and getting tested or screened for diseases. Follow your health care provider's instructions on monitoring your cholesterol and blood pressure. This information is not intended to replace advice given to you by your health care provider. Make sure you discuss any questions you have with your health care provider. Document Revised:  08/23/2020 Document Reviewed: 08/23/2020 Elsevier Patient Education  [employer].

## 2022-05-16 NOTE — Progress Notes (Unsigned)
Subjective:    Patient ID: Dalton Murphy, male    DOB: November 15, 1950, 72 y.o.   MRN: [id number]     HPI [facility] is here for a physical exam.   Overall doing pretty well.  No concerns.   Medications and allergies reviewed with patient and updated if appropriate.  Current Outpatient Medications on File Prior to Visit  Medication Sig Dispense Refill   albuterol (VENTOLIN HFA) 108 (90 Base) MCG/ACT inhaler INHALE 2 PUFFS BY MOUTH EVERY 6 HOURS AS NEEDED FOR WHEEZING FOR SHORTNESS OF BREATH 18 g 5   diclofenac (VOLTAREN) 75 MG EC tablet Take 1 tablet by mouth twice daily as needed 60 tablet 0   fluticasone (FLONASE) 50 MCG/ACT nasal spray Use 2 spray(s) in each nostril once daily 16 g 0   Fluticasone-Umeclidin-Vilant (TRELEGY ELLIPTA) 100-62.5-25 MCG/ACT AEPB Inhale 1 puff into the lungs daily. 1 each 11   levothyroxine (SYNTHROID) 200 MCG tablet TAKE 1 TABLET BY MOUTH ONCE DAILY BEFORE BREAKFAST 90 tablet 0   rosuvastatin (CRESTOR) 10 MG tablet Take 1 tablet (10 mg total) by mouth daily. 90 tablet 3   Semaglutide (RYBELSUS) 7 MG TABS Take 1 tablet by mouth once daily 30 tablet 5   No current facility-administered medications on file prior to visit.    Review of Systems  Constitutional:  Negative for fever.  Eyes:  Negative for visual disturbance.  Respiratory:  Negative for cough, shortness of breath and wheezing.   Cardiovascular:  Negative for chest pain, palpitations and leg swelling.  Gastrointestinal:  Negative for abdominal pain, blood in stool, constipation, diarrhea and nausea.       Occ gerd  Genitourinary:  Negative for dysuria and hematuria.  Musculoskeletal:  Positive for arthralgias. Negative for back pain.  Skin:  Negative for rash.  Neurological:  Positive for light-headedness (occ). Negative for headaches.  Psychiatric/Behavioral:  Negative for dysphoric mood. The patient is not nervous/anxious.        Objective:   Vitals:   05/17/22 1033  BP: 120/74   Pulse: 80  Temp: 98.2 F (36.8 C)  SpO2: 95%   Filed Weights   05/17/22 1033  Weight: 277 lb (125.6 kg)   Body mass index is 38.63 kg/m.  BP Readings from Last 3 Encounters:  05/17/22 120/74  02/08/22 118/68  09/21/21 126/72    Wt Readings from Last 3 Encounters:  05/17/22 277 lb (125.6 kg)  02/08/22 262 lb (118.8 kg)  09/21/21 269 lb (122 kg)      Physical Exam Constitutional: He appears well-developed and well-nourished. No distress.  HENT:  Head: Normocephalic and atraumatic.  Right Ear: External ear normal.  Left Ear: External ear normal.  Mouth/Throat: Oropharynx is clear and moist.  Normal ear canals and TM b/l  Eyes: Conjunctivae and EOM are normal.  Neck: Neck supple. No tracheal deviation present. No thyromegaly present.  No carotid bruit  Cardiovascular: Normal rate, regular rhythm, normal heart sounds and intact distal pulses.   No murmur heard. Pulmonary/Chest: Effort normal and breath sounds normal. No respiratory distress. He has no wheezes. He has no rales.  Abdominal: Soft. Umbilical and ventral hernia-nontender and reducible. He exhibits no distension. There is no tenderness.  Genitourinary: deferred  Musculoskeletal: He exhibits no edema.  Lymphadenopathy:   He has no cervical adenopathy.  Skin: Skin is warm and dry. He is not diaphoretic.  Psychiatric: He has a normal mood and affect. His behavior is normal.  Assessment & Plan:   Physical exam: Screening blood work  ordered Exercise   none Weight  encouraged weight loss Substance abuse   none   Reviewed recommended immunizations.   Health Maintenance  Topic Date Due   FOOT EXAM  Never done   COVID-19 Vaccine (3 - 2023-24 season) 12/16/2021   Diabetic kidney evaluation - Urine ACR  03/30/2022   HEMOGLOBIN A1C  08/10/2022   OPHTHALMOLOGY EXAM  10/21/2022   Medicare Annual Wellness (AWV)  01/18/2023   Diabetic kidney evaluation - eGFR measurement  02/09/2023    DTaP/Tdap/Td (2 - Td or Tdap) 12/13/2024   COLONOSCOPY (Pts 45-76yr Insurance coverage will need to be confirmed)  08/11/2025   Pneumonia Vaccine 72 Years old  Completed   INFLUENZA VACCINE  Completed   Hepatitis C Screening  Completed   Zoster Vaccines- Shingrix  Completed   HPV VACCINES  Aged Out     See Problem List for Assessment and Plan of chronic medical problems.

## 2022-05-17 ENCOUNTER — Ambulatory Visit (INDEPENDENT_AMBULATORY_CARE_PROVIDER_SITE_OTHER): Payer: Medicare PPO | Admitting: Internal Medicine

## 2022-05-17 VITALS — BP 120/74 | HR 80 | Temp 98.2°F | Ht 71.0 in | Wt 277.0 lb

## 2022-05-17 DIAGNOSIS — Z0001 Encounter for general adult medical examination with abnormal findings: Secondary | ICD-10-CM

## 2022-05-17 DIAGNOSIS — E1169 Type 2 diabetes mellitus with other specified complication: Secondary | ICD-10-CM | POA: Diagnosis not present

## 2022-05-17 DIAGNOSIS — E119 Type 2 diabetes mellitus without complications: Secondary | ICD-10-CM

## 2022-05-17 DIAGNOSIS — K439 Ventral hernia without obstruction or gangrene: Secondary | ICD-10-CM | POA: Diagnosis not present

## 2022-05-17 DIAGNOSIS — E038 Other specified hypothyroidism: Secondary | ICD-10-CM

## 2022-05-17 DIAGNOSIS — M109 Gout, unspecified: Secondary | ICD-10-CM | POA: Diagnosis not present

## 2022-05-17 DIAGNOSIS — Z0000 Encounter for general adult medical examination without abnormal findings: Secondary | ICD-10-CM | POA: Diagnosis not present

## 2022-05-17 DIAGNOSIS — E785 Hyperlipidemia, unspecified: Secondary | ICD-10-CM | POA: Diagnosis not present

## 2022-05-17 DIAGNOSIS — G4733 Obstructive sleep apnea (adult) (pediatric): Secondary | ICD-10-CM

## 2022-05-17 DIAGNOSIS — K429 Umbilical hernia without obstruction or gangrene: Secondary | ICD-10-CM | POA: Diagnosis not present

## 2022-05-17 DIAGNOSIS — R0609 Other forms of dyspnea: Secondary | ICD-10-CM

## 2022-05-17 LAB — COMPREHENSIVE METABOLIC PANEL
ALT: 35 U/L (ref 0–53)
AST: 24 U/L (ref 0–37)
Albumin: 4.3 g/dL (ref 3.5–5.2)
Alkaline Phosphatase: 65 U/L (ref 39–117)
BUN: 12 mg/dL (ref 6–23)
CO2: 25 mEq/L (ref 19–32)
Calcium: 8.8 mg/dL (ref 8.4–10.5)
Chloride: 106 mEq/L (ref 96–112)
Creatinine, Ser: 0.96 mg/dL (ref 0.40–1.50)
GFR: 79.42 mL/min (ref >60.00)
Glucose, Bld: 138 mg/dL — ABNORMAL HIGH (ref 70–99)
Potassium: 4.9 mEq/L (ref 3.5–5.1)
Sodium: 139 mEq/L (ref 135–145)
Total Bilirubin: 2.1 mg/dL — ABNORMAL HIGH (ref 0.2–1.2)
Total Protein: 7 g/dL (ref 6.0–8.3)

## 2022-05-17 LAB — HEMOGLOBIN A1C: Hgb A1c MFr Bld: 6.6 % — ABNORMAL HIGH (ref 4.6–6.5)

## 2022-05-17 LAB — URINALYSIS, ROUTINE W REFLEX MICROSCOPIC
Bilirubin Urine: NEGATIVE
Hgb urine dipstick: NEGATIVE
Ketones, ur: NEGATIVE
Leukocytes,Ua: NEGATIVE
Nitrite: NEGATIVE
RBC / HPF: NONE SEEN (ref 0–?)
Specific Gravity, Urine: 1.025 (ref 1.000–1.030)
Total Protein, Urine: NEGATIVE
Urine Glucose: NEGATIVE
Urobilinogen, UA: 0.2 (ref 0.0–1.0)
pH: 6 (ref 5.0–8.0)

## 2022-05-17 LAB — LIPID PANEL
Cholesterol: 107 mg/dL (ref 0–200)
HDL: 50.5 mg/dL (ref >39.00)
LDL Cholesterol: 35 mg/dL (ref 0–99)
NonHDL: 56.17
Total CHOL/HDL Ratio: 2
Triglycerides: 105 mg/dL (ref 0.0–149.0)
VLDL: 21 mg/dL (ref 0.0–40.0)

## 2022-05-17 LAB — CBC WITH DIFFERENTIAL/PLATELET
Basophils Absolute: 0.1 10*3/uL (ref 0.0–0.1)
Basophils Relative: 0.8 % (ref 0.0–3.0)
Eosinophils Absolute: 0.1 10*3/uL (ref 0.0–0.7)
Eosinophils Relative: 1.9 % (ref 0.0–5.0)
HCT: 42.6 % (ref 39.0–52.0)
Hemoglobin: 14.5 g/dL (ref 13.0–17.0)
Lymphocytes Relative: 24.3 % (ref 12.0–46.0)
Lymphs Abs: 1.5 10*3/uL (ref 0.7–4.0)
MCHC: 34.2 g/dL (ref 30.0–36.0)
MCV: 86.4 fl (ref 78.0–100.0)
Monocytes Absolute: 0.4 10*3/uL (ref 0.1–1.0)
Monocytes Relative: 5.7 % (ref 3.0–12.0)
Neutro Abs: 4.2 10*3/uL (ref 1.4–7.7)
Neutrophils Relative %: 67.3 % (ref 43.0–77.0)
Platelets: 189 10*3/uL (ref 150.0–400.0)
RBC: 4.93 Mil/uL (ref 4.22–5.81)
RDW: 14.2 % (ref 11.5–15.5)
WBC: 6.3 10*3/uL (ref 4.0–10.5)

## 2022-05-17 LAB — TSH: TSH: 0.38 u[IU]/mL (ref 0.35–5.50)

## 2022-05-17 LAB — MICROALBUMIN / CREATININE URINE RATIO
Creatinine,U: 194.9 mg/dL
Microalb Creat Ratio: 0.8 mg/g (ref 0.0–30.0)
Microalb, Ur: 1.7 mg/dL (ref 0.0–1.9)

## 2022-05-17 LAB — URIC ACID: Uric Acid, Serum: 5.8 mg/dL (ref 4.0–7.8)

## 2022-05-17 MED ORDER — RYBELSUS 14 MG PO TABS: 14.0000 mg | ORAL_TABLET | Freq: Every day | ORAL | 2 refills | Status: DC

## 2022-05-17 NOTE — Assessment & Plan Note (Signed)
Chronic Using CPAP nightly 

## 2022-05-17 NOTE — Assessment & Plan Note (Signed)
Chronic Asymptomatic Monitor

## 2022-05-17 NOTE — Assessment & Plan Note (Signed)
Chronic Denies any gout flares in the past year Not on any preventative medication Check uric acid level Continue diclofenac 75 mg twice daily as needed for gout flares

## 2022-05-17 NOTE — Assessment & Plan Note (Signed)
BMI 38.63 with diabetes, hyperlipidemia, gout He has gained approximately 15 pounds in the past 3 months Stressed weight loss Advise decrease portions, limiting carbs and sugars Increase exercise

## 2022-05-17 NOTE — Assessment & Plan Note (Signed)
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

## 2022-05-17 NOTE — Assessment & Plan Note (Addendum)
Chronic   Lab Results  Component Value Date   HGBA1C 6.7 (H) 02/08/2022   Sugars controlled Check A1c, urine microalbumin, UA today Has gained weight-stressed to him that he needs to lose weight to help keep his sugars controlled Increase Rybelsus to 14 mg daily Stressed regular exercise, diabetic diet Encouraged weight loss

## 2022-05-17 NOTE — Assessment & Plan Note (Signed)
Chronic  Clinically euthyroid Check tsh and will titrate med dose if needed Currently taking levothyroxine 200 mcg daily

## 2022-05-17 NOTE — Assessment & Plan Note (Signed)
Chronic Regular exercise and healthy diet encouraged Check lipid panel  Continue rosuvastatin 10 mg daily 

## 2022-05-31 ENCOUNTER — Other Ambulatory Visit: Payer: Self-pay | Admitting: Internal Medicine

## 2022-05-31 ENCOUNTER — Other Ambulatory Visit: Payer: Self-pay

## 2022-06-07 DIAGNOSIS — G4733 Obstructive sleep apnea (adult) (pediatric): Secondary | ICD-10-CM | POA: Diagnosis not present

## 2022-06-16 ENCOUNTER — Other Ambulatory Visit: Payer: Self-pay | Admitting: Internal Medicine

## 2022-07-07 ENCOUNTER — Other Ambulatory Visit: Payer: Self-pay

## 2022-07-07 ENCOUNTER — Other Ambulatory Visit: Payer: Self-pay | Admitting: Internal Medicine

## 2022-07-27 ENCOUNTER — Telehealth: Payer: Self-pay | Admitting: Internal Medicine

## 2022-07-27 NOTE — Telephone Encounter (Signed)
Placed in Dr. Burns blue folder 

## 2022-07-27 NOTE — Telephone Encounter (Signed)
Patient dropped off document Handicap Placard, to be filled out by provider. Patient requested to send it via Call Patient to pick up within 7-days. Document is located in providers tray at front office.Please advise at  [phone].

## 2022-07-31 NOTE — Telephone Encounter (Signed)
Spoke with spouse and handi-cap sticker left up front for pick up.

## 2022-08-14 ENCOUNTER — Telehealth: Payer: Medicare PPO | Admitting: Physician Assistant

## 2022-08-14 DIAGNOSIS — J208 Acute bronchitis due to other specified organisms: Secondary | ICD-10-CM

## 2022-08-14 DIAGNOSIS — B9689 Other specified bacterial agents as the cause of diseases classified elsewhere: Secondary | ICD-10-CM | POA: Diagnosis not present

## 2022-08-14 DIAGNOSIS — D2262 Melanocytic nevi of left upper limb, including shoulder: Secondary | ICD-10-CM | POA: Diagnosis not present

## 2022-08-14 DIAGNOSIS — D225 Melanocytic nevi of trunk: Secondary | ICD-10-CM | POA: Diagnosis not present

## 2022-08-14 DIAGNOSIS — L720 Epidermal cyst: Secondary | ICD-10-CM | POA: Diagnosis not present

## 2022-08-14 DIAGNOSIS — L821 Other seborrheic keratosis: Secondary | ICD-10-CM | POA: Diagnosis not present

## 2022-08-14 MED ORDER — BENZONATATE 100 MG PO CAPS: 100.0000 mg | ORAL_CAPSULE | Freq: Three times a day (TID) | ORAL | 0 refills | Status: DC | PRN

## 2022-08-14 MED ORDER — DOXYCYCLINE HYCLATE 100 MG PO TABS: 100.0000 mg | ORAL_TABLET | Freq: Two times a day (BID) | ORAL | 0 refills | Status: DC

## 2022-08-14 NOTE — Progress Notes (Signed)
I have spent 5 minutes in review of e-visit questionnaire, review and updating patient chart, medical decision making and response to patient.   Darus Hershman Cody Rocket Gunderson, PA-C    

## 2022-08-14 NOTE — Progress Notes (Signed)

## 2022-08-25 ENCOUNTER — Other Ambulatory Visit: Payer: Self-pay | Admitting: Internal Medicine

## 2022-09-18 ENCOUNTER — Other Ambulatory Visit: Payer: Self-pay | Admitting: Internal Medicine

## 2022-09-20 DIAGNOSIS — G4733 Obstructive sleep apnea (adult) (pediatric): Secondary | ICD-10-CM | POA: Diagnosis not present

## 2022-10-11 ENCOUNTER — Other Ambulatory Visit: Payer: Self-pay | Admitting: Internal Medicine

## 2022-10-24 ENCOUNTER — Other Ambulatory Visit: Payer: Self-pay | Admitting: Internal Medicine

## 2022-11-14 ENCOUNTER — Encounter: Payer: Self-pay | Admitting: Internal Medicine

## 2022-11-14 NOTE — Progress Notes (Signed)
      Subjective:    Patient ID: Dalton Murphy, male    DOB: [birth year], 72 y.o.   MRN: [id number]     HPI Dalton Murphy is here for follow up of his chronic medical problems.    Medications and allergies reviewed with patient and updated if appropriate.  Current Outpatient Medications on File Prior to Visit  Medication Sig Dispense Refill  . albuterol (VENTOLIN HFA) 108 (90 Base) MCG/ACT inhaler INHALE 2 PUFFS BY MOUTH EVERY 6 HOURS AS NEEDED FOR WHEEZING FOR SHORTNESS OF BREATH 18 g 5  . diclofenac (VOLTAREN) 75 MG EC tablet Take 1 tablet by mouth twice daily as needed 60 tablet 0  . fluticasone (FLONASE) 50 MCG/ACT nasal spray Use 2 spray(s) in each nostril once daily 16 g 5  . Fluticasone-Umeclidin-Vilant (TRELEGY ELLIPTA) 100-62.5-25 MCG/ACT AEPB Inhale 1 puff into the lungs daily. 1 each 11  . levothyroxine (SYNTHROID) 200 MCG tablet TAKE 1 TABLET BY MOUTH ONCE DAILY BEFORE BREAKFAST 90 tablet 1   No current facility-administered medications on file prior to visit.     Review of Systems     Objective:  There were no vitals filed for this visit. BP Readings from Last 3 Encounters:  11/24/22 124/82  05/17/22 120/74  02/08/22 118/68   Wt Readings from Last 3 Encounters:  12/26/22 267 lb (121.1 kg)  11/24/22 267 lb (121.1 kg)  05/17/22 277 lb (125.6 kg)   There is no height or weight on file to calculate BMI.    Physical Exam     Lab Results  Component Value Date   WBC 6.3 05/17/2022   HGB 14.5 05/17/2022   HCT 42.6 05/17/2022   PLT 189.0 05/17/2022   GLUCOSE 141 (H) 11/24/2022   CHOL 126 11/24/2022   TRIG 167.0 (H) 11/24/2022   HDL 47.30 11/24/2022   LDLDIRECT 113.0 03/30/2021   LDLCALC 45 11/24/2022   ALT 62 (H) 11/24/2022   AST 38 (H) 11/24/2022   NA 133 (L) 11/24/2022   K 4.1 11/24/2022   CL 100 11/24/2022   CREATININE 0.85 11/24/2022   BUN 17 11/24/2022   CO2 27 11/24/2022   TSH 1.31 11/24/2022   PSA 2.05 09/12/2017   INR 1.0 06/19/2018    HGBA1C 6.6 (H) 11/24/2022   MICROALBUR 1.7 05/17/2022     Assessment & Plan:    See Problem List for Assessment and Plan of chronic medical problems.    This encounter was created in error - please disregard.

## 2022-11-14 NOTE — Patient Instructions (Addendum)
      Blood work was ordered.   The lab is on the first floor.    Medications changes include :       A referral was ordered and someone will call you to schedule an appointment.     Return in about 6 months (around 05/18/2023) for Physical Exam.

## 2022-11-15 ENCOUNTER — Encounter: Payer: Medicare PPO | Admitting: Internal Medicine

## 2022-11-15 DIAGNOSIS — R0609 Other forms of dyspnea: Secondary | ICD-10-CM

## 2022-11-15 DIAGNOSIS — E1169 Type 2 diabetes mellitus with other specified complication: Secondary | ICD-10-CM

## 2022-11-15 DIAGNOSIS — E119 Type 2 diabetes mellitus without complications: Secondary | ICD-10-CM

## 2022-11-15 DIAGNOSIS — E038 Other specified hypothyroidism: Secondary | ICD-10-CM

## 2022-11-15 NOTE — Assessment & Plan Note (Signed)
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

## 2022-11-15 NOTE — Assessment & Plan Note (Signed)
Chronic  Clinically euthyroid Check tsh and will titrate med dose if needed Currently taking levothyroxine 200 mcg daily 

## 2022-11-15 NOTE — Assessment & Plan Note (Signed)
Chronic Regular exercise and healthy diet encouraged Check lipid panel  Continue rosuvastatin 10 mg daily 

## 2022-11-15 NOTE — Assessment & Plan Note (Signed)
Chronic   Lab Results  Component Value Date   HGBA1C 6.6 (H) 05/17/2022   Sugars controlled Check A1c Continue Rybelsus to 14 mg daily Stressed regular exercise, diabetic diet Encouraged weight loss

## 2022-11-17 LAB — HM DIABETES EYE EXAM

## 2022-11-22 DIAGNOSIS — H0100A Unspecified blepharitis right eye, upper and lower eyelids: Secondary | ICD-10-CM | POA: Diagnosis not present

## 2022-11-22 DIAGNOSIS — H40053 Ocular hypertension, bilateral: Secondary | ICD-10-CM | POA: Diagnosis not present

## 2022-11-22 DIAGNOSIS — H0100B Unspecified blepharitis left eye, upper and lower eyelids: Secondary | ICD-10-CM | POA: Diagnosis not present

## 2022-11-23 ENCOUNTER — Encounter: Payer: Self-pay | Admitting: Internal Medicine

## 2022-11-23 NOTE — Patient Instructions (Addendum)
      Blood work was ordered.   The lab is on the first floor.    Medications changes include : None     Return in about 6 months (around 05/27/2023) for Physical Exam.

## 2022-11-23 NOTE — Progress Notes (Signed)
Subjective:    Patient ID: Dalton Murphy, male    DOB: 11/24/1950, 72 y.o.   MRN: [id number]     HPI Dalton Murphy is here for follow up of his chronic medical problems.  Eating less and trying to eat better.   Stays active - no regular exercise.    Medications and allergies reviewed with patient and updated if appropriate.  Current Outpatient Medications on File Prior to Visit  Medication Sig Dispense Refill   albuterol (VENTOLIN HFA) 108 (90 Base) MCG/ACT inhaler INHALE 2 PUFFS BY MOUTH EVERY 6 HOURS AS NEEDED FOR WHEEZING FOR SHORTNESS OF BREATH 18 g 5   diclofenac (VOLTAREN) 75 MG EC tablet Take 1 tablet by mouth twice daily as needed 60 tablet 0   fluticasone (FLONASE) 50 MCG/ACT nasal spray Use 2 spray(s) in each nostril once daily 16 g 5   Fluticasone-Umeclidin-Vilant (TRELEGY ELLIPTA) 100-62.5-25 MCG/ACT AEPB Inhale 1 puff into the lungs daily. 1 each 11   levothyroxine (SYNTHROID) 200 MCG tablet TAKE 1 TABLET BY MOUTH ONCE DAILY BEFORE BREAKFAST 90 tablet 1   rosuvastatin (CRESTOR) 10 MG tablet Take 1 tablet by mouth once daily 90 tablet 0   Semaglutide (RYBELSUS) 14 MG TABS Take 1 tablet (14 mg total) by mouth daily. 90 tablet 2   No current facility-administered medications on file prior to visit.     Review of Systems  Constitutional:  Negative for fever.  Respiratory:  Positive for cough (occ  - currently has some chest congestion) and shortness of breath. Negative for wheezing.   Cardiovascular:  Negative for chest pain, palpitations and leg swelling.  Neurological:  Positive for light-headedness (occ) and headaches.       Objective:   Vitals:   11/24/22 0915  BP: 124/82  Pulse: 77  Temp: 98.3 F (36.8 C)  SpO2: 96%   BP Readings from Last 3 Encounters:  11/24/22 124/82  05/17/22 120/74  02/08/22 118/68   Wt Readings from Last 3 Encounters:  11/24/22 267 lb (121.1 kg)  05/17/22 277 lb (125.6 kg)  02/08/22 262 lb (118.8 kg)   Body mass  index is 37.24 kg/m.    Physical Exam Constitutional:      General: He is not in acute distress.    Appearance: Normal appearance. He is not ill-appearing.  HENT:     Head: Normocephalic and atraumatic.  Eyes:     Conjunctiva/sclera: Conjunctivae normal.  Cardiovascular:     Rate and Rhythm: Normal rate and regular rhythm.     Heart sounds: Normal heart sounds.  Pulmonary:     Effort: Pulmonary effort is normal. No respiratory distress.     Breath sounds: Normal breath sounds. No wheezing or rales.  Musculoskeletal:     Right lower leg: No edema.     Left lower leg: No edema.  Skin:    General: Skin is warm and dry.     Findings: No rash.  Neurological:     Mental Status: He is alert. Mental status is at baseline.  Psychiatric:        Mood and Affect: Mood normal.        Lab Results  Component Value Date   WBC 6.3 05/17/2022   HGB 14.5 05/17/2022   HCT 42.6 05/17/2022   PLT 189.0 05/17/2022   GLUCOSE 138 (H) 05/17/2022   CHOL 107 05/17/2022   TRIG 105.0 05/17/2022   HDL 50.50 05/17/2022   LDLDIRECT 113.0 03/30/2021   LDLCALC 35  05/17/2022   ALT 35 05/17/2022   AST 24 05/17/2022   NA 139 05/17/2022   K 4.9 05/17/2022   CL 106 05/17/2022   CREATININE 0.96 05/17/2022   BUN 12 05/17/2022   CO2 25 05/17/2022   TSH 0.38 05/17/2022   PSA 2.05 09/12/2017   INR 1.0 06/19/2018   HGBA1C 6.6 (H) 05/17/2022   MICROALBUR 1.7 05/17/2022     Assessment & Plan:    See Problem List for Assessment and Plan of chronic medical problems.

## 2022-11-24 ENCOUNTER — Ambulatory Visit: Payer: Medicare PPO | Admitting: Internal Medicine

## 2022-11-24 VITALS — BP 124/82 | HR 77 | Temp 98.3°F | Ht 71.0 in | Wt 267.0 lb

## 2022-11-24 DIAGNOSIS — E785 Hyperlipidemia, unspecified: Secondary | ICD-10-CM | POA: Diagnosis not present

## 2022-11-24 DIAGNOSIS — E1169 Type 2 diabetes mellitus with other specified complication: Secondary | ICD-10-CM

## 2022-11-24 DIAGNOSIS — E119 Type 2 diabetes mellitus without complications: Secondary | ICD-10-CM

## 2022-11-24 DIAGNOSIS — Z7984 Long term (current) use of oral hypoglycemic drugs: Secondary | ICD-10-CM

## 2022-11-24 DIAGNOSIS — E038 Other specified hypothyroidism: Secondary | ICD-10-CM | POA: Diagnosis not present

## 2022-11-24 DIAGNOSIS — R0609 Other forms of dyspnea: Secondary | ICD-10-CM

## 2022-11-24 LAB — COMPREHENSIVE METABOLIC PANEL
ALT: 62 U/L — ABNORMAL HIGH (ref 0–53)
AST: 38 U/L — ABNORMAL HIGH (ref 0–37)
Albumin: 4.5 g/dL (ref 3.5–5.2)
Alkaline Phosphatase: 75 U/L (ref 39–117)
BUN: 17 mg/dL (ref 6–23)
CO2: 27 mEq/L (ref 19–32)
Calcium: 9.2 mg/dL (ref 8.4–10.5)
Chloride: 100 mEq/L (ref 96–112)
Creatinine, Ser: 0.85 mg/dL (ref 0.40–1.50)
GFR: 86.99 mL/min (ref >60.00)
Glucose, Bld: 141 mg/dL — ABNORMAL HIGH (ref 70–99)
Potassium: 4.1 mEq/L (ref 3.5–5.1)
Sodium: 133 mEq/L — ABNORMAL LOW (ref 135–145)
Total Bilirubin: 2 mg/dL — ABNORMAL HIGH (ref 0.2–1.2)
Total Protein: 7.5 g/dL (ref 6.0–8.3)

## 2022-11-24 LAB — LIPID PANEL
Cholesterol: 126 mg/dL (ref 0–200)
HDL: 47.3 mg/dL (ref >39.00)
LDL Cholesterol: 45 mg/dL (ref 0–99)
NonHDL: 78.63
Total CHOL/HDL Ratio: 3
Triglycerides: 167 mg/dL — ABNORMAL HIGH (ref 0.0–149.0)
VLDL: 33.4 mg/dL (ref 0.0–40.0)

## 2022-11-24 LAB — TSH: TSH: 1.31 u[IU]/mL (ref 0.35–5.50)

## 2022-11-24 LAB — HEMOGLOBIN A1C: Hgb A1c MFr Bld: 6.6 % — ABNORMAL HIGH (ref 4.6–6.5)

## 2022-11-24 MED ORDER — ROSUVASTATIN CALCIUM 10 MG PO TABS: 10.0000 mg | ORAL_TABLET | Freq: Every day | ORAL | 2 refills | Status: DC

## 2022-11-24 MED ORDER — RYBELSUS 14 MG PO TABS: 14.0000 mg | ORAL_TABLET | Freq: Every day | ORAL | 2 refills | Status: DC

## 2022-11-24 NOTE — Assessment & Plan Note (Signed)
 Chronic   Lab Results  Component Value Date   HGBA1C 6.6 (H) 05/17/2022   Sugars controlled Check A1c Continue Rybelsus to 14 mg daily Stressed regular exercise, diabetic diet Encouraged weight loss

## 2022-11-24 NOTE — Assessment & Plan Note (Signed)
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

## 2022-11-24 NOTE — Assessment & Plan Note (Signed)
Chronic  Clinically euthyroid Check tsh and will titrate med dose if needed Currently taking levothyroxine 200 mcg daily 

## 2022-11-24 NOTE — Assessment & Plan Note (Signed)
Chronic Regular exercise and healthy diet encouraged Check lipid panel, CMP Continue rosuvastatin 10 mg daily

## 2022-12-26 ENCOUNTER — Ambulatory Visit (INDEPENDENT_AMBULATORY_CARE_PROVIDER_SITE_OTHER): Payer: Medicare PPO

## 2022-12-26 VITALS — Ht 70.5 in | Wt 267.0 lb

## 2022-12-26 DIAGNOSIS — Z0000 Encounter for general adult medical examination without abnormal findings: Secondary | ICD-10-CM
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
## 2022-12-26 NOTE — Patient Instructions (Signed)
Dalton Murphy , Thank you for taking time to come for your Medicare Wellness Visit. I appreciate your ongoing commitment to your health goals. Please review the following plan we discussed and let me know if I can assist you in the future.   Referrals/Orders/Follow-Ups/Clinician Recommendations: You will soon be due for a Flu vaccine.  Keep up the giood work and nice talking with you today.  This is a list of the screening recommended for you and due dates:  Health Maintenance  Topic Date Due   Eye exam for diabetics  10/21/2022   COVID-19 Vaccine (3 - 2023-24 season) 12/17/2022   Flu Shot  07/16/2023*   Yearly kidney health urinalysis for diabetes  05/18/2023   Hemoglobin A1C  05/27/2023   Complete foot exam   11/15/2023   Yearly kidney function blood test for diabetes  11/24/2023   Medicare Annual Wellness Visit  12/26/2023   DTaP/Tdap/Td vaccine (2 - Td or Tdap) 12/13/2024   Colon Cancer Screening  08/11/2025   Pneumonia Vaccine  Completed   Hepatitis C Screening  Completed   Zoster (Shingles) Vaccine  Completed   HPV Vaccine  Aged Out  *Topic was postponed. The date shown is not the original due date.    Advanced directives: (Copy Requested) Please bring a copy of your health care power of attorney and living will to the office to be added to your chart at your convenience.  Next Medicare Annual Wellness Visit scheduled for next year: Yes

## 2022-12-28 ENCOUNTER — Other Ambulatory Visit: Payer: Self-pay | Admitting: Internal Medicine

## 2022-12-28 DIAGNOSIS — G4733 Obstructive sleep apnea (adult) (pediatric): Secondary | ICD-10-CM | POA: Diagnosis not present

## 2023-01-11 ENCOUNTER — Telehealth: Payer: Self-pay | Admitting: Internal Medicine

## 2023-01-11 NOTE — Telephone Encounter (Signed)
Pt dropped Handicap renewal form at the front desk and I place them on Dr.Burns box.   Please call him @ [phone] when is ready for pick up.   Thanks!

## 2023-01-11 NOTE — Telephone Encounter (Signed)
Pt dropped off Handicap renewal form to be filled out by Dr. Lawerance Bach and I have place it at her box.   Please call him @ [phone] when is ready.  Thanks!

## 2023-01-12 NOTE — Telephone Encounter (Signed)
Form signed and given to wife today.

## 2023-01-15 ENCOUNTER — Telehealth: Payer: Medicare PPO | Admitting: Emergency Medicine

## 2023-01-15 DIAGNOSIS — J069 Acute upper respiratory infection, unspecified: Secondary | ICD-10-CM | POA: Diagnosis not present

## 2023-01-15 MED ORDER — AMOXICILLIN-POT CLAVULANATE 875-125 MG PO TABS: 1.0000 | ORAL_TABLET | Freq: Two times a day (BID) | ORAL | 0 refills | Status: DC

## 2023-01-15 MED ORDER — BENZONATATE 100 MG PO CAPS
100.0000 mg | ORAL_CAPSULE | Freq: Two times a day (BID) | ORAL | 0 refills | Status: DC | PRN
Start: 2023-01-15 — End: 2023-03-26

## 2023-01-15 NOTE — Progress Notes (Signed)
E-Visit for Upper Respiratory Infection   We are sorry you are not feeling well.  Here is how we plan to help!  Based on what you have shared with me, it looks like you may have a viral upper respiratory infection.  Upper respiratory infections are caused by a large number of viruses; however, rhinovirus is the most common cause.   Symptoms vary from person to person, with common symptoms including sore throat, cough, fatigue or lack of energy and feeling of general discomfort.  A low-grade fever of up to 100.4 may present, but is often uncommon.  Symptoms vary however, and are closely related to a person's age or underlying illnesses.  The most common symptoms associated with an upper respiratory infection are nasal discharge or congestion, cough, sneezing, headache and pressure in the ears and face.  These symptoms usually persist for about 3 to 10 days, but can last up to 2 weeks.  It is important to know that upper respiratory infections do not cause serious illness or complications in most cases.    Upper respiratory infections can be transmitted from person to person, with the most common method of transmission being a person's hands.  The virus is able to live on the skin and can infect other persons for up to 2 hours after direct contact.  Also, these can be transmitted when someone coughs or sneezes; thus, it is important to cover the mouth to reduce this risk.  To keep the spread of the illness at bay, good hand hygiene is very important.  Given the length of you symptoms, it's probably worth an antibiotic trial at this point.  I'll prescribe you Augmentin which should help treat infection in the sinuses and in the lungs.   For nasal congestion, you may use an oral decongestants such as Mucinex D or if you have glaucoma or high blood pressure use plain Mucinex.  Saline nasal spray or nasal drops can help and can safely be used as often as needed for congestion.    If you do not have a  history of heart disease, hypertension, diabetes or thyroid disease, prostate/bladder issues or glaucoma, you may also use Sudafed to treat nasal congestion.  It is highly recommended that you consult with a pharmacist or your primary care physician to ensure this medication is safe for you to take.     If you have a cough, you may use cough suppressants such as Delsym and Robitussin.  If you have glaucoma or high blood pressure, you can also use Coricidin HBP.   For cough I have prescribed for you A prescription cough medication called Tessalon Perles 100 mg. You may take 1-2 capsules every 8 hours as needed for cough  If you have a sore or scratchy throat, use a saltwater gargle-  to  teaspoon of salt dissolved in a 4-ounce to 8-ounce glass of warm water.  Gargle the solution for approximately 15-30 seconds and then spit.  It is important not to swallow the solution.  You can also use throat lozenges/cough drops and Chloraseptic spray to help with throat pain or discomfort.  Warm or cold liquids can also be helpful in relieving throat pain.  For headache, pain or general discomfort, you can use Ibuprofen or Tylenol as directed.   Some authorities believe that zinc sprays or the use of Echinacea may shorten the course of your symptoms.   HOME CARE Only take medications as instructed by your medical team. Be sure to drink  plenty of fluids. Water is fine as well as fruit juices, sodas and electrolyte beverages. You may want to stay away from caffeine or alcohol. If you are nauseated, try taking small sips of liquids. How do you know if you are getting enough fluid? Your urine should be a pale yellow or almost colorless. Get rest. Taking a steamy shower or using a humidifier may help nasal congestion and ease sore throat pain. You can place a towel over your head and breathe in the steam from hot water coming from a faucet. Using a saline nasal spray works much the same way. Cough drops, hard  candies and sore throat lozenges may ease your cough. Avoid close contacts especially the very young and the elderly Cover your mouth if you cough or sneeze Always remember to wash your hands.   GET HELP RIGHT AWAY IF: You develop worsening fever. If your symptoms do not improve within 10 days You develop yellow or green discharge from your nose over 3 days. You have coughing fits You develop a severe head ache or visual changes. You develop shortness of breath, difficulty breathing or start having chest pain Your symptoms persist after you have completed your treatment plan  MAKE SURE YOU  Understand these instructions. Will watch your condition. Will get help right away if you are not doing well or get worse.  Thank you for choosing an e-visit.  Your e-visit answers were reviewed by a board certified advanced clinical practitioner to complete your personal care plan. Depending upon the condition, your plan could have included both over the counter or prescription medications.  Please review your pharmacy choice. Make sure the pharmacy is open so you can pick up prescription now. If there is a problem, you may contact your provider through [employer] and have the prescription routed to another pharmacy.  Your safety is important to [place]. If you have drug allergies check your prescription carefully.   For the next 24 hours you can use MyChart to ask questions about today's visit, request a non-urgent call back, or ask for a work or school excuse. You will get an email in the next two days asking about your experience. I hope that your e-visit has been valuable and will speed your recovery.    Approximately 5 minutes was used in reviewing the patient's chart, questionnaire, prescribing medications, and documentation.

## 2023-02-14 ENCOUNTER — Other Ambulatory Visit: Payer: Self-pay | Admitting: Internal Medicine

## 2023-03-15 ENCOUNTER — Other Ambulatory Visit: Payer: Self-pay | Admitting: Internal Medicine

## 2023-03-26 NOTE — Patient Instructions (Addendum)
     Medications changes include :   cough syrup, augmentin and eye drops     Return if symptoms worsen or fail to improve.  

## 2023-03-26 NOTE — Progress Notes (Unsigned)
    Subjective:    Patient ID: Dalton Murphy, male    DOB: 1950/05/04, 72 y.o.   MRN: [id number]      HPI Dalton Murphy is here for No chief complaint on file.   He is here for an acute visit for cold symptoms.  His symptoms started  He is experiencing   He has tried taking      Medications and allergies reviewed with patient and updated if appropriate.  Current Outpatient Medications on File Prior to Visit  Medication Sig Dispense Refill   albuterol (VENTOLIN HFA) 108 (90 Base) MCG/ACT inhaler INHALE 2 PUFFS BY MOUTH EVERY 6 HOURS AS NEEDED FOR WHEEZING FOR SHORTNESS OF BREATH 18 g 5   amoxicillin-clavulanate (AUGMENTIN) 875-125 MG tablet Take 1 tablet by mouth every 12 (twelve) hours. 14 tablet 0   benzonatate (TESSALON) 100 MG capsule Take 1 capsule (100 mg total) by mouth 2 (two) times daily as needed for cough. 20 capsule 0   diclofenac (VOLTAREN) 75 MG EC tablet Take 1 tablet by mouth twice daily as needed 60 tablet 3   fluticasone (FLONASE) 50 MCG/ACT nasal spray Use 2 spray(s) in each nostril once daily 16 g 5   levothyroxine (SYNTHROID) 200 MCG tablet TAKE 1 TABLET BY MOUTH ONCE DAILY BEFORE BREAKFAST 90 tablet 1   rosuvastatin (CRESTOR) 10 MG tablet Take 1 tablet (10 mg total) by mouth daily. 90 tablet 2   Semaglutide (RYBELSUS) 14 MG TABS Take 1 tablet (14 mg total) by mouth daily. 90 tablet 2   TRELEGY ELLIPTA 100-62.5-25 MCG/ACT AEPB INHALE 1 PUFF ONCE DAILY 60 each 0   No current facility-administered medications on file prior to visit.    Review of Systems     Objective:  There were no vitals filed for this visit. BP Readings from Last 3 Encounters:  11/24/22 124/82  05/17/22 120/74  02/08/22 118/68   Wt Readings from Last 3 Encounters:  12/26/22 267 lb (121.1 kg)  11/24/22 267 lb (121.1 kg)  05/17/22 277 lb (125.6 kg)   There is no height or weight on file to calculate BMI.    Physical Exam Constitutional:      General: He is not in acute  distress.    Appearance: Normal appearance. He is not ill-appearing.  HENT:     Head: Normocephalic.     Right Ear: Tympanic membrane, ear canal and external ear normal. There is no impacted cerumen.     Left Ear: Tympanic membrane, ear canal and external ear normal. There is no impacted cerumen.     Mouth/Throat:     Mouth: Mucous membranes are moist.     Pharynx: No oropharyngeal exudate or posterior oropharyngeal erythema.  Eyes:     Conjunctiva/sclera: Conjunctivae normal.  Cardiovascular:     Rate and Rhythm: Normal rate and regular rhythm.  Pulmonary:     Effort: Pulmonary effort is normal. No respiratory distress.     Breath sounds: Normal breath sounds. No wheezing or rales.  Musculoskeletal:     Cervical back: Neck supple. No tenderness.  Lymphadenopathy:     Cervical: No cervical adenopathy.  Skin:    General: Skin is warm and dry.     Findings: No rash.  Neurological:     Mental Status: He is alert.            Assessment & Plan:    See Problem List for Assessment and Plan of chronic medical problems.

## 2023-03-27 ENCOUNTER — Ambulatory Visit: Payer: Medicare PPO | Admitting: Internal Medicine

## 2023-03-27 VITALS — BP 134/80 | HR 60 | Temp 98.2°F | Ht 70.5 in | Wt 262.0 lb

## 2023-03-27 DIAGNOSIS — J209 Acute bronchitis, unspecified: Secondary | ICD-10-CM | POA: Diagnosis not present

## 2023-03-27 DIAGNOSIS — J441 Chronic obstructive pulmonary disease with (acute) exacerbation: Secondary | ICD-10-CM | POA: Insufficient documentation

## 2023-03-27 MED ORDER — HYDROCOD POLI-CHLORPHE POLI ER 10-8 MG/5ML PO SUER
5.0000 mL | Freq: Two times a day (BID) | ORAL | 0 refills | Status: DC | PRN
Start: 2023-03-27 — End: 2023-05-29

## 2023-03-27 MED ORDER — TRELEGY ELLIPTA 100-62.5-25 MCG/ACT IN AEPB: INHALATION_SPRAY | RESPIRATORY_TRACT | 3 refills | Status: DC

## 2023-03-27 MED ORDER — CEFDINIR 300 MG PO CAPS: 300.0000 mg | ORAL_CAPSULE | Freq: Two times a day (BID) | ORAL | 0 refills | Status: DC

## 2023-03-27 NOTE — Assessment & Plan Note (Signed)
Acute Likely bacterial with COPD exacerbation Start Omnicef 300 mg BID x 10 day Tussionex twice daily as needed Continue inhalers otc cold medications Rest, fluid Call if no improvement

## 2023-03-27 NOTE — Assessment & Plan Note (Signed)
Acute Related to URI, acute bronchitis Lungs sound clear Continue trelegy daily, albuterol prn

## 2023-03-28 DIAGNOSIS — G4733 Obstructive sleep apnea (adult) (pediatric): Secondary | ICD-10-CM | POA: Diagnosis not present

## 2023-05-16 ENCOUNTER — Other Ambulatory Visit: Payer: Self-pay | Admitting: Internal Medicine

## 2023-05-23 ENCOUNTER — Telehealth: Payer: Self-pay | Admitting: Internal Medicine

## 2023-05-23 ENCOUNTER — Other Ambulatory Visit: Payer: Self-pay

## 2023-05-23 ENCOUNTER — Ambulatory Visit: Payer: Medicare PPO

## 2023-05-23 VITALS — Ht 70.0 in | Wt 262.0 lb

## 2023-05-23 DIAGNOSIS — E119 Type 2 diabetes mellitus without complications: Secondary | ICD-10-CM | POA: Diagnosis not present

## 2023-05-23 DIAGNOSIS — Z0000 Encounter for general adult medical examination without abnormal findings: Secondary | ICD-10-CM

## 2023-05-23 MED ORDER — LEVOTHYROXINE SODIUM 200 MCG PO TABS: 200.0000 ug | ORAL_TABLET | Freq: Every day | ORAL | 0 refills | Status: DC

## 2023-05-23 NOTE — Telephone Encounter (Signed)
 Copied from CRM [id number]. Topic: Clinical - Prescription Issue >> May 23, 2023 11:35 AM Corin V wrote: Reason for CRM: Patient called to ask if his levothyroxine  (SYNTHROID ) 200 MCG tablet prescription can be sent to the [facility] at [address], [place], [place] [address] since they have it in stock and his normal [facility] would need an alternate medicaiton sent in.

## 2023-05-23 NOTE — Patient Instructions (Addendum)
 Mr. Hulsebus , Thank you for taking time to come for your Medicare Wellness Visit. I appreciate your ongoing commitment to your health goals. Please review the following plan we discussed and let me know if I can assist you in the future.   Referrals/Orders/Follow-Ups/Clinician Recommendations: It was nice talking to you today.  Remember to call [facility], at [phone], to ask if you should be getting a colonoscopy sooner.    This is a list of the screening recommended for you and due dates:  Health Maintenance  Topic Date Due   Eye exam for diabetics  10/21/2022   COVID-19 Vaccine (3 - 2024-25 season) 12/17/2022   Yearly kidney health urinalysis for diabetes  05/18/2023   Flu Shot  07/16/2023*   Hemoglobin A1C  05/27/2023   Complete foot exam   11/15/2023   Yearly kidney function blood test for diabetes  11/24/2023   Medicare Annual Wellness Visit  05/22/2024   DTaP/Tdap/Td vaccine (2 - Td or Tdap) 12/13/2024   Colon Cancer Screening  08/11/2025   Pneumonia Vaccine  Completed   Hepatitis C Screening  Completed   Zoster (Shingles) Vaccine  Completed   HPV Vaccine  Aged Out  *Topic was postponed. The date shown is not the original due date.    Advanced directives: (Copy Requested) Please bring a copy of your health care power of attorney and living will to the office to be added to your chart at your convenience.  Next Medicare Annual Wellness Visit scheduled for next year: Yes

## 2023-05-23 NOTE — Telephone Encounter (Signed)
 Sent today
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
## 2023-05-29 NOTE — Progress Notes (Unsigned)
Subjective:    Patient ID: Dalton Murphy, male    DOB: [birth year], 73 y.o.   MRN: [id number]     HPI Dalton Murphy is here for a physical exam and his chronic medical problems.   No concerns.     Medications and allergies reviewed with patient and updated if appropriate.  Current Outpatient Medications on File Prior to Visit  Medication Sig Dispense Refill   albuterol (VENTOLIN HFA) 108 (90 Base) MCG/ACT inhaler INHALE 2 PUFFS BY MOUTH EVERY 6 HOURS AS NEEDED FOR WHEEZING FOR SHORTNESS OF BREATH 18 g 5   diclofenac (VOLTAREN) 75 MG EC tablet Take 1 tablet by mouth twice daily as needed 60 tablet 3   fluticasone (FLONASE) 50 MCG/ACT nasal spray Use 2 spray(s) in each nostril once daily 16 g 5   Fluticasone-Umeclidin-Vilant (TRELEGY ELLIPTA) 100-62.5-25 MCG/ACT AEPB INHALE 1 PUFF ONCE DAILY 180 each 3   levothyroxine (SYNTHROID) 200 MCG tablet Take 1 tablet (200 mcg total) by mouth daily before breakfast. 90 tablet 0   rosuvastatin (CRESTOR) 10 MG tablet Take 1 tablet (10 mg total) by mouth daily. 90 tablet 2   Semaglutide (RYBELSUS) 14 MG TABS Take 1 tablet (14 mg total) by mouth daily. 90 tablet 2   No current facility-administered medications on file prior to visit.    Review of Systems  Constitutional:  Negative for fever.  Eyes:  Negative for visual disturbance.  Respiratory:  Positive for shortness of breath and wheezing (occ). Negative for cough (occ - mucus in chest).   Cardiovascular:  Positive for leg swelling (occ with salt intake). Negative for chest pain and palpitations.  Gastrointestinal:  Negative for abdominal pain, blood in stool, constipation and diarrhea.       No gerd  Genitourinary:  Positive for difficulty urinating and urgency. Negative for dysuria and hematuria.  Musculoskeletal:  Positive for arthralgias (mild). Negative for back pain.  Skin:  Negative for rash.  Neurological:  Negative for light-headedness and headaches.  Psychiatric/Behavioral:   Negative for dysphoric mood. The patient is not nervous/anxious.        Objective:   Vitals:   05/30/23 1106  BP: 132/68  Pulse: 74  Temp: 98.2 F (36.8 C)  SpO2: 95%   Filed Weights   05/30/23 1106  Weight: 265 lb (120.2 kg)   Body mass index is 38.02 kg/m.  BP Readings from Last 3 Encounters:  05/30/23 132/68  03/27/23 134/80  11/24/22 124/82    Wt Readings from Last 3 Encounters:  05/30/23 265 lb (120.2 kg)  05/23/23 262 lb (118.8 kg)  03/27/23 262 lb (118.8 kg)      Physical Exam Constitutional: He appears well-developed and well-nourished. No distress.  HENT:  Head: Normocephalic and atraumatic.  Right Ear: External ear normal.  Left Ear: External ear normal.  Normal ear canals and TM b/l  Mouth/Throat: Oropharynx is clear and moist. Eyes: Conjunctivae and EOM are normal.  Neck: Neck supple. No tracheal deviation present. No thyromegaly present.  No carotid bruit  Cardiovascular: Normal rate, regular rhythm, normal heart sounds and intact distal pulses.   No murmur heard.  No lower extremity edema. Pulmonary/Chest: Effort normal and breath sounds normal. No respiratory distress. He has no wheezes. He has no rales.  Abdominal: Soft. He exhibits no distension. There is no tenderness.  Genitourinary: deferred  Lymphadenopathy:   He has no cervical adenopathy.  Skin: Skin is warm and dry. He is not diaphoretic.  Psychiatric: He has a normal  mood and affect. His behavior is normal.         Assessment & Plan:   Physical exam: Screening blood work  ordered Exercise   yard work - encouraged regular walking [name]  obese Substance abuse   none   Reviewed recommended immunizations.   Health Maintenance  Topic Date Due   Diabetic kidney evaluation - Urine ACR  05/18/2023   HEMOGLOBIN A1C  05/27/2023   COVID-19 Vaccine (3 - 2024-25 season) 06/15/2023 (Originally 12/17/2022)   INFLUENZA VACCINE  07/16/2023 (Originally 11/16/2022)   FOOT EXAM  11/15/2023    OPHTHALMOLOGY EXAM  11/17/2023   Diabetic kidney evaluation - eGFR measurement  11/24/2023   Medicare Annual Wellness (AWV)  05/22/2024   DTaP/Tdap/Td (2 - Td or Tdap) 12/13/2024   Colonoscopy  08/11/2025   Pneumonia Vaccine 28+ Years old  Completed   Hepatitis C Screening  Completed   Zoster Vaccines- Shingrix  Completed   HPV VACCINES  Aged Out     See Problem List for Assessment and Plan of chronic medical problems.

## 2023-05-29 NOTE — Patient Instructions (Addendum)
Blood work was ordered.       Medications changes include :   valium for MRI    A MRI of your brain and ultrasound of your carotid arteries were ordered and someone will call you to schedule an appointment.     Return in about 6 months (around 11/27/2023) for follow up.   Health Maintenance, Male Adopting a healthy lifestyle and getting preventive care are important in promoting health and wellness. Ask your health care provider about: The right schedule for you to have regular tests and exams. Things you can do on your own to prevent diseases and keep yourself healthy. What should I know about diet, weight, and exercise? Eat a healthy diet  Eat a diet that includes plenty of vegetables, fruits, low-fat dairy products, and lean protein. Do not eat a lot of foods that are high in solid fats, added sugars, or sodium. Maintain a healthy weight Body mass index (BMI) is a measurement that can be used to identify possible weight problems. It estimates body fat based on height and weight. Your health care provider can help determine your BMI and help you achieve or maintain a healthy weight. Get regular exercise Get regular exercise. This is one of the most important things you can do for your health. Most adults should: Exercise for at least 150 minutes each week. The exercise should increase your heart rate and make you sweat (moderate-intensity exercise). Do strengthening exercises at least twice a week. This is in addition to the moderate-intensity exercise. Spend less time sitting. Even light physical activity can be beneficial. Watch cholesterol and blood lipids Have your blood tested for lipids and cholesterol at 73 years of age, then have this test every 5 years. You may need to have your cholesterol levels checked more often if: Your lipid or cholesterol levels are high. You are older than 73 years of age. You are at high risk for heart disease. What should I know  about cancer screening? Many types of cancers can be detected early and may often be prevented. Depending on your health history and family history, you may need to have cancer screening at various ages. This may include screening for: Colorectal cancer. Prostate cancer. Skin cancer. Lung cancer. What should I know about heart disease, diabetes, and high blood pressure? Blood pressure and heart disease High blood pressure causes heart disease and increases the risk of stroke. This is more likely to develop in people who have high blood pressure readings or are overweight. Talk with your health care provider about your target blood pressure readings. Have your blood pressure checked: Every 3-5 years if you are 38-34 years of age. Every year if you are 39 years old or older. If you are between the ages of 64 and 68 and are a current or former smoker, ask your health care provider if you should have a one-time screening for abdominal aortic aneurysm (AAA). Diabetes Have regular diabetes screenings. This checks your fasting blood sugar level. Have the screening done: Once every three years after age 25 if you are at a normal weight and have a low risk for diabetes. More often and at a younger age if you are overweight or have a high risk for diabetes. What should I know about preventing infection? Hepatitis B If you have a higher risk for hepatitis B, you should be screened for this virus. Talk with your health care provider to find out if you are at risk  for hepatitis B infection. Hepatitis C Blood testing is recommended for: Everyone born from [birth year] through 1965. Anyone with known risk factors for hepatitis C. Sexually transmitted infections (STIs) You should be screened each year for STIs, including gonorrhea and chlamydia, if: You are sexually active and are younger than 73 years of age. You are older than 73 years of age and your health care provider tells you that you are at risk for  this type of infection. Your sexual activity has changed since you were last screened, and you are at increased risk for chlamydia or gonorrhea. Ask your health care provider if you are at risk. Ask your health care provider about whether you are at high risk for HIV. Your health care provider may recommend a prescription medicine to help prevent HIV infection. If you choose to take medicine to prevent HIV, you should first get tested for HIV. You should then be tested every 3 months for as long as you are taking the medicine. Follow these instructions at home: Alcohol use Do not drink alcohol if your health care provider tells you not to drink. If you drink alcohol: Limit how much you have to 0-2 drinks a day. Know how much alcohol is in your drink. In the [place] one drink equals one 12 oz bottle of beer (355 mL), one 5 oz glass of wine (148 mL), or one 1 oz glass of hard liquor (44 mL). Lifestyle Do not use any products that contain nicotine or tobacco. These products include cigarettes, chewing tobacco, and vaping devices, such as e-cigarettes. If you need help quitting, ask your health care provider. Do not use street drugs. Do not share needles. Ask your health care provider for help if you need support or information about quitting drugs. General instructions Schedule regular health, dental, and eye exams. Stay current with your vaccines. Tell your health care provider if: You often feel depressed. You have ever been abused or do not feel safe at home. Summary Adopting a healthy lifestyle and getting preventive care are important in promoting health and wellness. Follow your health care provider's instructions about healthy diet, exercising, and getting tested or screened for diseases. Follow your health care provider's instructions on monitoring your cholesterol and blood pressure. This information is not intended to replace advice given to you by your health care provider. Make sure  you discuss any questions you have with your health care provider. Document Revised: 08/23/2020 Document Reviewed: 08/23/2020 Elsevier Patient Education  2024 [employer].

## 2023-05-30 ENCOUNTER — Ambulatory Visit (INDEPENDENT_AMBULATORY_CARE_PROVIDER_SITE_OTHER): Payer: Medicare PPO | Admitting: Internal Medicine

## 2023-05-30 VITALS — BP 132/68 | HR 74 | Temp 98.2°F | Ht 70.0 in | Wt 265.0 lb

## 2023-05-30 DIAGNOSIS — Z125 Encounter for screening for malignant neoplasm of prostate: Secondary | ICD-10-CM | POA: Diagnosis not present

## 2023-05-30 DIAGNOSIS — E119 Type 2 diabetes mellitus without complications: Secondary | ICD-10-CM

## 2023-05-30 DIAGNOSIS — Z7984 Long term (current) use of oral hypoglycemic drugs: Secondary | ICD-10-CM

## 2023-05-30 DIAGNOSIS — E038 Other specified hypothyroidism: Secondary | ICD-10-CM

## 2023-05-30 DIAGNOSIS — Z0000 Encounter for general adult medical examination without abnormal findings: Secondary | ICD-10-CM | POA: Diagnosis not present

## 2023-05-30 DIAGNOSIS — E785 Hyperlipidemia, unspecified: Secondary | ICD-10-CM | POA: Diagnosis not present

## 2023-05-30 DIAGNOSIS — Z6838 Body mass index (BMI) 38.0-38.9, adult: Secondary | ICD-10-CM

## 2023-05-30 DIAGNOSIS — M1712 Unilateral primary osteoarthritis, left knee: Secondary | ICD-10-CM

## 2023-05-30 DIAGNOSIS — Z23 Encounter for immunization: Secondary | ICD-10-CM

## 2023-05-30 DIAGNOSIS — R0609 Other forms of dyspnea: Secondary | ICD-10-CM

## 2023-05-30 DIAGNOSIS — G4733 Obstructive sleep apnea (adult) (pediatric): Secondary | ICD-10-CM

## 2023-05-30 DIAGNOSIS — E1169 Type 2 diabetes mellitus with other specified complication: Secondary | ICD-10-CM | POA: Diagnosis not present

## 2023-05-30 LAB — TSH: TSH: 1.1 u[IU]/mL (ref 0.35–5.50)

## 2023-05-30 LAB — COMPREHENSIVE METABOLIC PANEL
ALT: 53 U/L (ref 0–53)
AST: 30 U/L (ref 0–37)
Albumin: 4.5 g/dL (ref 3.5–5.2)
Alkaline Phosphatase: 78 U/L (ref 39–117)
BUN: 17 mg/dL (ref 6–23)
CO2: 26 meq/L (ref 19–32)
Calcium: 9.2 mg/dL (ref 8.4–10.5)
Chloride: 103 meq/L (ref 96–112)
Creatinine, Ser: 0.72 mg/dL (ref 0.40–1.50)
GFR: 91.13 mL/min (ref >60.00)
Glucose, Bld: 145 mg/dL — ABNORMAL HIGH (ref 70–99)
Potassium: 4.9 meq/L (ref 3.5–5.1)
Sodium: 138 meq/L (ref 135–145)
Total Bilirubin: 1.7 mg/dL — ABNORMAL HIGH (ref 0.2–1.2)
Total Protein: 7.2 g/dL (ref 6.0–8.3)

## 2023-05-30 LAB — HEMOGLOBIN A1C: Hgb A1c MFr Bld: 6.8 % — ABNORMAL HIGH (ref 4.6–6.5)

## 2023-05-30 LAB — CBC WITH DIFFERENTIAL/PLATELET
Basophils Absolute: 0.1 10*3/uL (ref 0.0–0.1)
Basophils Relative: 1.2 % (ref 0.0–3.0)
Eosinophils Absolute: 0.1 10*3/uL (ref 0.0–0.7)
Eosinophils Relative: 1.9 % (ref 0.0–5.0)
HCT: 46.4 % (ref 39.0–52.0)
Hemoglobin: 15.5 g/dL (ref 13.0–17.0)
Lymphocytes Relative: 22.3 % (ref 12.0–46.0)
Lymphs Abs: 1.6 10*3/uL (ref 0.7–4.0)
MCHC: 33.4 g/dL (ref 30.0–36.0)
MCV: 87.6 fL (ref 78.0–100.0)
Monocytes Absolute: 0.4 10*3/uL (ref 0.1–1.0)
Monocytes Relative: 6.2 % (ref 3.0–12.0)
Neutro Abs: 4.8 10*3/uL (ref 1.4–7.7)
Neutrophils Relative %: 68.4 % (ref 43.0–77.0)
Platelets: 189 10*3/uL (ref 150.0–400.0)
RBC: 5.3 Mil/uL (ref 4.22–5.81)
RDW: 14.6 % (ref 11.5–15.5)
WBC: 7 10*3/uL (ref 4.0–10.5)

## 2023-05-30 LAB — LIPID PANEL
Cholesterol: 138 mg/dL (ref 0–200)
HDL: 60.7 mg/dL (ref >39.00)
LDL Cholesterol: 56 mg/dL (ref 0–99)
NonHDL: 77.72
Total CHOL/HDL Ratio: 2
Triglycerides: 110 mg/dL (ref 0.0–149.0)
VLDL: 22 mg/dL (ref 0.0–40.0)

## 2023-05-30 LAB — PSA, MEDICARE: PSA: 3.64 ng/mL (ref 0.10–4.00)

## 2023-05-30 NOTE — Assessment & Plan Note (Signed)
Chronic With diabetes, hyperlipidemia, gout BMI 38.63 six months ago Stressed weight loss Advise decrease portions, limiting carbs and sugars Increase exercise

## 2023-05-30 NOTE — Assessment & Plan Note (Signed)
Chronic Regular exercise and healthy diet encouraged Check lipid panel, CMP Continue rosuvastatin 10 mg daily Lab Results  Component Value Date   LDLCALC 45 11/24/2022

## 2023-05-30 NOTE — Addendum Note (Signed)
Addended by: Karma Ganja on: 05/30/2023 04:51 PM   Modules accepted: Orders

## 2023-05-30 NOTE — Assessment & Plan Note (Signed)
Chronic  Lab Results  Component Value Date   HGBA1C 6.6 (H) 11/24/2022   Sugars controlled Check A1c, urine microalbumin Continue Rybelsus to 14 mg daily Stressed regular exercise, diabetic diet Encouraged weight loss

## 2023-05-30 NOTE — Assessment & Plan Note (Addendum)
Chronic Continue diclofenac 75 mcg once -twice daily as needed Following with orthopedics

## 2023-05-30 NOTE — Assessment & Plan Note (Signed)
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

## 2023-05-30 NOTE — Assessment & Plan Note (Signed)
Chronic ?Using CPAP nightly ?

## 2023-05-30 NOTE — Addendum Note (Signed)
Addended by: Pincus Sanes on: 05/30/2023 11:42 AM   Modules accepted: Level of Service

## 2023-05-30 NOTE — Assessment & Plan Note (Signed)
Chronic  Clinically euthyroid Check tsh and will titrate med dose if needed Currently taking levothyroxine 200 mcg daily

## 2023-05-31 ENCOUNTER — Encounter: Payer: Self-pay | Admitting: Internal Medicine

## 2023-05-31 LAB — MICROALBUMIN / CREATININE URINE RATIO
Creatinine,U: 220.7 mg/dL
Microalb Creat Ratio: 22 mg/g (ref 0.0–30.0)
Microalb, Ur: 4.9 mg/dL — ABNORMAL HIGH (ref 0.0–1.9)

## 2023-06-06 ENCOUNTER — Telehealth: Payer: Medicare PPO | Admitting: Physician Assistant

## 2023-06-06 DIAGNOSIS — B9689 Other specified bacterial agents as the cause of diseases classified elsewhere: Secondary | ICD-10-CM | POA: Diagnosis not present

## 2023-06-06 DIAGNOSIS — J019 Acute sinusitis, unspecified: Secondary | ICD-10-CM | POA: Diagnosis not present

## 2023-06-06 MED ORDER — DOXYCYCLINE HYCLATE 100 MG PO TABS: 100.0000 mg | ORAL_TABLET | Freq: Two times a day (BID) | ORAL | 0 refills | Status: DC

## 2023-06-06 NOTE — Progress Notes (Signed)

## 2023-06-06 NOTE — Progress Notes (Signed)
 I have spent 5 minutes in review of e-visit questionnaire, review and updating patient chart, medical decision making and response to patient.   Piedad Climes, PA-C

## 2023-06-08 DIAGNOSIS — H10503 Unspecified blepharoconjunctivitis, bilateral: Secondary | ICD-10-CM | POA: Diagnosis not present

## 2023-06-08 DIAGNOSIS — H40053 Ocular hypertension, bilateral: Secondary | ICD-10-CM | POA: Diagnosis not present

## 2023-06-15 DIAGNOSIS — H10503 Unspecified blepharoconjunctivitis, bilateral: Secondary | ICD-10-CM | POA: Diagnosis not present

## 2023-06-26 DIAGNOSIS — G4733 Obstructive sleep apnea (adult) (pediatric): Secondary | ICD-10-CM | POA: Diagnosis not present

## 2023-08-04 ENCOUNTER — Other Ambulatory Visit: Payer: Self-pay | Admitting: Internal Medicine

## 2023-08-21 ENCOUNTER — Telehealth: Payer: Self-pay | Admitting: Internal Medicine

## 2023-08-21 ENCOUNTER — Other Ambulatory Visit (HOSPITAL_COMMUNITY): Payer: Self-pay

## 2023-08-21 NOTE — Telephone Encounter (Signed)
 Copied from CRM [id number]. Topic: Clinical - Medication Question >> Aug 21, 2023 11:14 AM Ovid Blow wrote: Reason for CRM: patient needs prior authorization for medication Semaglutide  (RYBELSUS ) 14 MG TABS... please contact patient regarding this matter

## 2023-08-23 ENCOUNTER — Other Ambulatory Visit (HOSPITAL_COMMUNITY): Payer: Self-pay

## 2023-09-10 ENCOUNTER — Other Ambulatory Visit: Payer: Self-pay | Admitting: Internal Medicine

## 2023-09-17 ENCOUNTER — Other Ambulatory Visit: Payer: Self-pay | Admitting: Internal Medicine

## 2023-09-25 ENCOUNTER — Other Ambulatory Visit: Payer: Self-pay | Admitting: Internal Medicine

## 2023-09-27 DIAGNOSIS — G4733 Obstructive sleep apnea (adult) (pediatric): Secondary | ICD-10-CM | POA: Diagnosis not present

## 2023-10-06 ENCOUNTER — Telehealth: Admitting: Physician Assistant

## 2023-10-06 DIAGNOSIS — J069 Acute upper respiratory infection, unspecified: Secondary | ICD-10-CM

## 2023-10-06 NOTE — Progress Notes (Signed)
  Because of the duration of your symptoms, I feel your condition warrants further evaluation and I recommend that you be seen in a face-to-face visit.   NOTE: There will be NO CHARGE for this E-Visit   If you are having a true medical emergency, please call 911.     For an urgent face to face visit, [facility] has multiple urgent care centers for your convenience.  Click the link below for the full list of locations and hours, walk-in wait times, appointment scheduling options and driving directions:  Urgent Care - [place], [place], [place], [place], [place], [place]  [place]     Your MyChart E-visit questionnaire answers were reviewed by a board certified advanced clinical practitioner to complete your personal care plan based on your specific symptoms.    Thank you for using e-Visits.

## 2023-10-08 ENCOUNTER — Ambulatory Visit: Payer: Self-pay

## 2023-10-08 NOTE — Progress Notes (Unsigned)
    Subjective:    Patient ID: Dalton Murphy, male    DOB: Aug 24, 1950, 73 y.o.   MRN: [id number]      HPI Dalton Murphy is here for No chief complaint on file.   He is here for an acute visit for cold symptoms.  His symptoms started  He is experiencing   He has tried taking      Medications and allergies reviewed with patient and updated if appropriate.  Current Outpatient Medications on File Prior to Visit  Medication Sig Dispense Refill   albuterol  (VENTOLIN  HFA) 108 (90 Base) MCG/ACT inhaler INHALE 2 PUFFS BY MOUTH EVERY 6 HOURS AS NEEDED FOR WHEEZING FOR SHORTNESS OF BREATH 18 g 5   diclofenac  (VOLTAREN ) 75 MG EC tablet Take 1 tablet by mouth twice daily as needed 60 tablet 3   doxycycline  (VIBRA -TABS) 100 MG tablet Take 1 tablet (100 mg total) by mouth 2 (two) times daily. 20 tablet 0   fluticasone  (FLONASE ) 50 MCG/ACT nasal spray Use 2 spray(s) in each nostril once daily 16 g 0   Fluticasone -Umeclidin-Vilant (TRELEGY ELLIPTA ) 100-62.5-25 MCG/ACT AEPB INHALE 1 PUFF ONCE DAILY 180 each 3   levothyroxine  (SYNTHROID ) 200 MCG tablet Take 1 tablet (200 mcg total) by mouth daily before breakfast. 90 tablet 0   rosuvastatin  (CRESTOR ) 10 MG tablet Take 1 tablet by mouth once daily 90 tablet 0   Semaglutide  (RYBELSUS ) 14 MG TABS Take 1 tablet (14 mg total) by mouth daily. 90 tablet 2   No current facility-administered medications on file prior to visit.    Review of Systems     Objective:  There were no vitals filed for this visit. BP Readings from Last 3 Encounters:  05/30/23 132/68  03/27/23 134/80  11/24/22 124/82   Wt Readings from Last 3 Encounters:  05/30/23 265 lb (120.2 kg)  05/23/23 262 lb (118.8 kg)  03/27/23 262 lb (118.8 kg)   There is no height or weight on file to calculate BMI.    Physical Exam Constitutional:      General: He is not in acute distress.    Appearance: Normal appearance. He is not ill-appearing.  HENT:     Head: Normocephalic.      Right Ear: Tympanic membrane, ear canal and external ear normal. There is no impacted cerumen.     Left Ear: Tympanic membrane, ear canal and external ear normal. There is no impacted cerumen.     Mouth/Throat:     Mouth: Mucous membranes are moist.     Pharynx: No oropharyngeal exudate or posterior oropharyngeal erythema.   Eyes:     Conjunctiva/sclera: Conjunctivae normal.    Cardiovascular:     Rate and Rhythm: Normal rate and regular rhythm.  Pulmonary:     Effort: Pulmonary effort is normal. No respiratory distress.     Breath sounds: Normal breath sounds. No wheezing or rales.   Musculoskeletal:     Cervical back: Neck supple. No tenderness.  Lymphadenopathy:     Cervical: No cervical adenopathy.   Skin:    General: Skin is warm and dry.     Findings: No rash.   Neurological:     Mental Status: He is alert.            Assessment & Plan:    See Problem List for Assessment and Plan of chronic medical problems.

## 2023-10-08 NOTE — Telephone Encounter (Signed)
 FYI Only or Action Required?: FYI only for provider.  Patient was last seen in primary care on 05/30/2023 by Geofm Glade PARAS, MD. Called Nurse Triage reporting Cough. Symptoms began a week ago. Interventions attempted: OTC medications. Symptoms are: gradually worsening.  Triage Disposition: See HCP Within 4 Hours (Or PCP Triage)  Patient/caregiver understands and will follow disposition?: Yes, will follow disposition  Copied from CRM [id number]. Topic: Clinical - Red Word Triage >> Oct 08, 2023  8:28 AM Kevelyn M wrote: Red Word that prompted transfer to Nurse Triage: Deep coughing and wheezing since last week. Cough has gotten worse. Reason for Disposition  Wheezing is present  Answer Assessment - Initial Assessment Questions 1. ONSET: When did the cough begin?      Last week, getting worse 2. SEVERITY: How bad is the cough today?      Pretty bad 3. SPUTUM: Describe the color of your sputum (none, dry cough; clear, white, yellow, green)     yellow 4. HEMOPTYSIS: Are you coughing up any blood? If so ask: How much? (flecks, streaks, tablespoons, etc.)     denies 5. DIFFICULTY BREATHING: Are you having difficulty breathing? If Yes, ask: How bad is it? (e.g., mild, moderate, severe)    - MILD: No SOB at rest, mild SOB with walking, speaks normally in sentences, can lie down, no retractions, pulse < 100.    - MODERATE: SOB at rest, SOB with minimal exertion and prefers to sit, cannot lie down flat, speaks in phrases, mild retractions, audible wheezing, pulse 100-120.    - SEVERE: Very SOB at rest, speaks in single words, struggling to breathe, sitting hunched forward, retractions, pulse > 120      Wheezing  6. FEVER: Do you have a fever? If Yes, ask: What is your temperature, how was it measured, and when did it start?     denies 7. CARDIAC HISTORY: Do you have any history of heart disease? (e.g., heart attack, congestive heart failure)      denies 8. LUNG HISTORY: Do you  have any history of lung disease?  (e.g., pulmonary embolus, asthma, emphysema)     COPD 9. PE RISK FACTORS: Do you have a history of blood clots? (or: recent major surgery, recent prolonged travel, bedridden)     denies 10. OTHER SYMPTOMS: Do you have any other symptoms? (e.g., runny nose, wheezing, chest pain)       Runny nose, wheezing 12. TRAVEL: Have you traveled out of the country in the last month? (e.g., travel history, exposures)       Denies  Offered appts today with another provider in the clinic, caller declined, prefers PCP, scheduled tomorrow.  Protocols used: Cough - Acute Productive-A-AH

## 2023-10-08 NOTE — Patient Instructions (Addendum)
    Medications changes include :   Omnicef  twice daily x 10 days, cough syrup   Start using your albuterol  inhaler 3 times a day until you feel better.   Continue using the over the counter cold medications.     Return if symptoms worsen or fail to improve.

## 2023-10-09 ENCOUNTER — Ambulatory Visit: Admitting: Internal Medicine

## 2023-10-09 ENCOUNTER — Encounter: Payer: Self-pay | Admitting: Internal Medicine

## 2023-10-09 VITALS — BP 128/70 | HR 81 | Temp 98.5°F | Ht 70.0 in | Wt 250.0 lb

## 2023-10-09 DIAGNOSIS — E119 Type 2 diabetes mellitus without complications: Secondary | ICD-10-CM

## 2023-10-09 DIAGNOSIS — J209 Acute bronchitis, unspecified: Secondary | ICD-10-CM | POA: Diagnosis not present

## 2023-10-09 MED ORDER — HYDROCOD POLI-CHLORPHE POLI ER 10-8 MG/5ML PO SUER: 5.0000 mL | Freq: Two times a day (BID) | ORAL | 0 refills | Status: DC | PRN

## 2023-10-09 MED ORDER — CEFDINIR 300 MG PO CAPS: 300.0000 mg | ORAL_CAPSULE | Freq: Two times a day (BID) | ORAL | 0 refills | Status: DC

## 2023-10-09 NOTE — Assessment & Plan Note (Addendum)
 Acute Likely bacterial with COPD exacerbation Start Omnicef  300 mg BID x 10 day Tussionex twice daily as needed No need for prednisone  at this time Continue trelegy and start using albuterol  3 times a day Will hold off on steroids unless there is no improvement otc cold medications Rest, fluid Call if no improvement

## 2023-10-09 NOTE — Assessment & Plan Note (Signed)
 Chronic  Lab Results  Component Value Date   HGBA1C 6.8 (H) 05/30/2023   Sugars controlled Has wheeze - would like to avoid steroids  - start using albuterol  three times a day Continue Rybelsus  to 14 mg daily Stressed regular exercise, diabetic diet Encouraged weight loss

## 2023-10-22 ENCOUNTER — Ambulatory Visit: Admitting: Internal Medicine

## 2023-10-22 ENCOUNTER — Ambulatory Visit: Payer: Self-pay

## 2023-10-22 ENCOUNTER — Encounter: Payer: Self-pay | Admitting: Internal Medicine

## 2023-10-22 VITALS — BP 120/64 | HR 95 | Temp 98.1°F | Ht 70.0 in | Wt 251.4 lb

## 2023-10-22 DIAGNOSIS — J441 Chronic obstructive pulmonary disease with (acute) exacerbation: Secondary | ICD-10-CM

## 2023-10-22 DIAGNOSIS — Z7984 Long term (current) use of oral hypoglycemic drugs: Secondary | ICD-10-CM

## 2023-10-22 DIAGNOSIS — E119 Type 2 diabetes mellitus without complications: Secondary | ICD-10-CM

## 2023-10-22 DIAGNOSIS — L02212 Cutaneous abscess of back [any part, except buttock]: Secondary | ICD-10-CM | POA: Diagnosis not present

## 2023-10-22 DIAGNOSIS — L720 Epidermal cyst: Secondary | ICD-10-CM | POA: Diagnosis not present

## 2023-10-22 DIAGNOSIS — J209 Acute bronchitis, unspecified: Secondary | ICD-10-CM | POA: Diagnosis not present

## 2023-10-22 MED ORDER — METHYLPREDNISOLONE 4 MG PO TBPK
ORAL_TABLET | ORAL | 0 refills | Status: DC
Start: 2023-10-22 — End: 2023-11-05

## 2023-10-22 MED ORDER — HYDROCOD POLI-CHLORPHE POLI ER 10-8 MG/5ML PO SUER: 5.0000 mL | Freq: Two times a day (BID) | ORAL | 0 refills | Status: DC | PRN

## 2023-10-22 NOTE — Progress Notes (Addendum)
 Subjective:  Patient ID: Dalton Murphy, male    DOB: 07/02/1950  Age: 73 y.o. MRN: [id number]  CC: Cough (Pt finished 10 day course of Abx ... Pt states the cough, chest congestion, sob an wheezing has returned since finishing the abx.)   HPI Dalton Murphy presents for ongoing cough Pt finished abx on Thur last week He felt better after abx, than started to feel worse C/o a lot of clear mucus now, congested On CPAP  Outpatient Medications Prior to Visit  Medication Sig Dispense Refill   albuterol  (VENTOLIN  HFA) 108 (90 Base) MCG/ACT inhaler INHALE 2 PUFFS BY MOUTH EVERY 6 HOURS AS NEEDED FOR WHEEZING FOR SHORTNESS OF BREATH 18 g 5   diclofenac  (VOLTAREN ) 75 MG EC tablet Take 1 tablet by mouth twice daily as needed 60 tablet 3   fluticasone  (FLONASE ) 50 MCG/ACT nasal spray Use 2 spray(s) in each nostril once daily 16 g 0   Fluticasone -Umeclidin-Vilant (TRELEGY ELLIPTA ) 100-62.5-25 MCG/ACT AEPB INHALE 1 PUFF ONCE DAILY 180 each 3   levothyroxine  (SYNTHROID ) 200 MCG tablet Take 1 tablet (200 mcg total) by mouth daily before breakfast. 90 tablet 0   rosuvastatin  (CRESTOR ) 10 MG tablet Take 1 tablet by mouth once daily 90 tablet 0   Semaglutide  (RYBELSUS ) 14 MG TABS Take 1 tablet (14 mg total) by mouth daily. 90 tablet 2   cefdinir  (OMNICEF ) 300 MG capsule Take 1 capsule (300 mg total) by mouth 2 (two) times daily. 20 capsule 0   chlorpheniramine-HYDROcodone  (TUSSIONEX) 10-8 MG/5ML Take 5 mLs by mouth every 12 (twelve) hours as needed. 115 mL 0   No facility-administered medications prior to visit.    ROS: Review of Systems  Constitutional:  Negative for appetite change, diaphoresis, fatigue, fever and unexpected weight change.  HENT:  Positive for postnasal drip. Negative for congestion, nosebleeds, sneezing, sore throat and trouble swallowing.   Eyes:  Negative for itching and visual disturbance.  Respiratory:  Positive for cough, chest tightness and wheezing. Negative  for shortness of breath.   Cardiovascular:  Negative for chest pain, palpitations and leg swelling.  Gastrointestinal:  Negative for abdominal distention, blood in stool, diarrhea and nausea.  Genitourinary:  Negative for frequency and hematuria.  Musculoskeletal:  Negative for back pain, gait problem, joint swelling and neck pain.  Skin:  Negative for rash.  Neurological:  Negative for dizziness, tremors, speech difficulty and weakness.  Psychiatric/Behavioral:  Negative for agitation, dysphoric mood and sleep disturbance. The patient is not nervous/anxious.     Objective:  BP 120/64   Pulse 95   Temp 98.1 F (36.7 C) (Oral)   Ht 5' 10 (1.778 m)   Wt 251 lb 6.4 oz (114 kg)   SpO2 (!) 68%   BMI 36.07 kg/m   BP Readings from Last 3 Encounters:  10/22/23 120/64  10/09/23 128/70  05/30/23 132/68    Wt Readings from Last 3 Encounters:  10/22/23 251 lb 6.4 oz (114 kg)  10/09/23 250 lb (113.4 kg)  05/30/23 265 lb (120.2 kg)    Physical Exam Constitutional:      General: He is not in acute distress.    Appearance: He is well-developed. He is obese. He is not ill-appearing.     Comments: NAD  HENT:     Nose: Congestion present.  Eyes:     Conjunctiva/sclera: Conjunctivae normal.     Pupils: Pupils are equal, round, and reactive to light.  Neck:     Thyroid : No thyromegaly.  Vascular: No JVD.  Cardiovascular:     Rate and Rhythm: Normal rate and regular rhythm.     Heart sounds: Normal heart sounds. No murmur heard.    No friction rub. No gallop.  Pulmonary:     Effort: Pulmonary effort is normal. No respiratory distress.     Breath sounds: Normal breath sounds. No wheezing, rhonchi or rales.  Chest:     Chest wall: No tenderness.  Abdominal:     General: Bowel sounds are normal. There is no distension.     Palpations: Abdomen is soft. There is no mass.     Tenderness: There is no abdominal tenderness. There is no guarding or rebound.  Musculoskeletal:         General: No tenderness. Normal range of motion.     Cervical back: Normal range of motion.     Right lower leg: No edema.     Left lower leg: No edema.  Lymphadenopathy:     Cervical: No cervical adenopathy.  Skin:    General: Skin is warm and dry.     Findings: No rash.  Neurological:     Mental Status: He is alert and oriented to person, place, and time.     Cranial Nerves: No cranial nerve deficit.     Motor: No abnormal muscle tone.     Coordination: Coordination normal.     Gait: Gait normal.     Deep Tendon Reflexes: Reflexes are normal and symmetric.  Psychiatric:        Behavior: Behavior normal.        Thought Content: Thought content normal.        Judgment: Judgment normal.   No wheezes  Lab Results  Component Value Date   WBC 7.0 05/30/2023   HGB 15.5 05/30/2023   HCT 46.4 05/30/2023   PLT 189.0 05/30/2023   GLUCOSE 145 (H) 05/30/2023   CHOL 138 05/30/2023   TRIG 110.0 05/30/2023   HDL 60.70 05/30/2023   LDLDIRECT 113.0 03/30/2021   LDLCALC 56 05/30/2023   ALT 53 05/30/2023   AST 30 05/30/2023   NA 138 05/30/2023   K 4.9 05/30/2023   CL 103 05/30/2023   CREATININE 0.72 05/30/2023   BUN 17 05/30/2023   CO2 26 05/30/2023   TSH 1.10 05/30/2023   PSA 3.64 05/30/2023   INR 1.0 06/19/2018   HGBA1C 6.8 (H) 05/30/2023   MICROALBUR 4.9 (H) 05/30/2023    No results found.  Assessment & Plan:   Problem List Items Addressed This Visit     Diabetes mellitus without complication (HCC) - Primary   Steroids side effects were discussed On Rybelsus       Acute bronchitis   Pt finished the abx      Relevant Orders   DG Chest 2 View   RESOLVED: COPD with acute exacerbation (HCC)   CXR Medrol  pack Cont Trelegy MDI Tussionex Rx refill F/u w/Dr Geofm      Relevant Medications   methylPREDNISolone  (MEDROL  DOSEPAK) 4 MG TBPK tablet   chlorpheniramine-HYDROcodone  (TUSSIONEX) 10-8 MG/5ML      Meds ordered this encounter  Medications    methylPREDNISolone  (MEDROL  DOSEPAK) 4 MG TBPK tablet    Sig: As directed    Dispense:  21 tablet    Refill:  0   chlorpheniramine-HYDROcodone  (TUSSIONEX) 10-8 MG/5ML    Sig: Take 5 mLs by mouth every 12 (twelve) hours as needed.    Dispense:  115 mL    Refill:  0  Follow-up: Return in about 4 weeks (around 11/19/2023) for f/u with PCP.  Marolyn Noel, MD

## 2023-10-22 NOTE — Telephone Encounter (Signed)
 FYI Only or Action Required?: FYI only for provider.  Patient was last seen in primary care on 10/09/2023 by Geofm Glade PARAS, MD. Called Nurse Triage reporting Wheezing, Shortness of Breath, and Cough. Symptoms began several weeks ago. Interventions attempted: Prescription medications: cefdinir , hydrocodone  cough syrup and Rest, hydration, or home remedies. Symptoms are: not improved after antibx.  Triage Disposition: See HCP Within 4 Hours (Or PCP Triage)  Patient/caregiver understands and will follow disposition?: Yes       Copied from CRM [id number]. Topic: Clinical - Red Word Triage >> Oct 22, 2023  8:32 AM Donna BRAVO wrote: Red Word that prompted transfer to Nurse Triage: patient wife Consuelo calling, patient was seen 10/09/23 bronchitis, finished antibiotic 10/18/23 patient still wheezing, problems breathing, coughing, Reason for Disposition  [1] MILD difficulty breathing (e.g., minimal/no SOB at rest, SOB with walking, pulse <100) AND [2] NEW-onset or WORSE than normal  Answer Assessment - Initial Assessment Questions 1. RESPIRATORY STATUS: Describe your breathing? (e.g., wheezing, shortness of breath, unable to speak, severe coughing)      Some improvement with cefdinir  10 days, took as prescribed, finished 7/3, also hydrocodone  cough syrup Just seems like cough has not improved much Lot of chest congestion still, not coughing anything up Prior to cefdinir  had yellow sputum but did clear up Cough and wheezing still Comes and goes SOB and wheezing Feels this way just sitting and relaxing Less energy Still able to stand and walk around, affecting normal activities though A little dizziness, a little bit like going to pass out No chest pain, heart racing Uses trelegy No rescue inhaler 2. ONSET: When did this breathing problem begin?      Couple weeks ago 3. PATTERN Does the difficult breathing come and go, or has it been constant since it started?      Comes and goes 4.  SEVERITY: How bad is your breathing? (e.g., mild, moderate, severe)    - MILD: No SOB at rest, mild SOB with walking, speaks normally in sentences, can lie down, no retractions, pulse < 100.    - MODERATE: SOB at rest, SOB with minimal exertion and prefers to sit, cannot lie down flat, speaks in phrases, mild retractions, audible wheezing, pulse 100-120.    - SEVERE: Very SOB at rest, speaks in single words, struggling to breathe, sitting hunched forward, retractions, pulse > 120      Still speaking full sentences 5. RECURRENT SYMPTOM: Have you had difficulty breathing before? If Yes, ask: When was the last time? and What happened that time?      Yes, had some bouts of bronchitis before and similar cough but usually cleared up a lot better 6. CARDIAC HISTORY: Do you have any history of heart disease? (e.g., heart attack, angina, bypass surgery, angioplasty)      no 7. LUNG HISTORY: Do you have any history of lung disease?  (e.g., pulmonary embolus, asthma, emphysema)     Think COPD 9. OTHER SYMPTOMS: Do you have any other symptoms? (e.g., dizziness, runny nose, cough, chest pain, fever)     Runny nose, no fever  Protocols used: Breathing Difficulty-A-AH

## 2023-10-22 NOTE — Assessment & Plan Note (Signed)
 Pt finished the abx

## 2023-10-22 NOTE — Assessment & Plan Note (Addendum)
 Steroids side effects were discussed On Rybelsus 

## 2023-10-22 NOTE — Addendum Note (Signed)
 Addended by: Makaylin Carlo V on: 10/22/2023 03:00 PM   Modules accepted: Orders

## 2023-10-22 NOTE — Assessment & Plan Note (Addendum)
 CXR Medrol  pack Cont Trelegy MDI Tussionex Rx refill F/u w/Dr Geofm

## 2023-11-05 ENCOUNTER — Ambulatory Visit: Admitting: Family Medicine

## 2023-11-05 ENCOUNTER — Encounter: Payer: Self-pay | Admitting: Family Medicine

## 2023-11-05 VITALS — BP 132/78 | HR 108 | Temp 98.8°F | Resp 18 | Ht 70.0 in | Wt 249.0 lb

## 2023-11-05 DIAGNOSIS — N3001 Acute cystitis with hematuria: Secondary | ICD-10-CM

## 2023-11-05 DIAGNOSIS — D485 Neoplasm of uncertain behavior of skin: Secondary | ICD-10-CM | POA: Diagnosis not present

## 2023-11-05 DIAGNOSIS — L720 Epidermal cyst: Secondary | ICD-10-CM | POA: Diagnosis not present

## 2023-11-05 LAB — POCT URINALYSIS DIPSTICK
Bilirubin, UA: NEGATIVE
Blood, UA: POSITIVE
Glucose, UA: NEGATIVE
Ketones, UA: NEGATIVE
Nitrite, UA: NEGATIVE
Protein, UA: NEGATIVE
Spec Grav, UA: 1.015 (ref 1.010–1.025)
Urobilinogen, UA: NEGATIVE U/dL — AB
pH, UA: 5 (ref 5.0–8.0)

## 2023-11-05 MED ORDER — HYDROCOD POLI-CHLORPHE POLI ER 10-8 MG/5ML PO SUER: 5.0000 mL | Freq: Two times a day (BID) | ORAL | 0 refills | Status: DC | PRN

## 2023-11-05 MED ORDER — CIPROFLOXACIN HCL 500 MG PO TABS
500.0000 mg | ORAL_TABLET | Freq: Two times a day (BID) | ORAL | 0 refills | Status: AC
Start: 2023-11-05 — End: 2023-11-15

## 2023-11-05 NOTE — Addendum Note (Signed)
 Addended by: MERLYNN NIKI FALCON on: 11/05/2023 03:18 PM   Modules accepted: Orders

## 2023-11-05 NOTE — Progress Notes (Signed)
 Assessment & Plan:  1. Acute cystitis with hematuria (Primary) Education provided on UTIs. Encouraged adequate hydration.  - Urine Culture; Future - POCT urinalysis dipstick - ciprofloxacin  (CIPRO ) 500 MG tablet; Take 1 tablet (500 mg total) by mouth 2 (two) times daily for 10 days.  Dispense: 20 tablet; Refill: 0   Follow up plan: Return if symptoms worsen or fail to improve.  Niki Rung, MSN, APRN, FNP-C  Subjective:  HPI: Dalton Murphy is a 73 y.o. male presenting on 11/05/2023 for Urinary Tract Infection (Groin pain and pressure, urgency, dysuria, and frequency some incontinence recently. Started about 3 days ago.)  Patient complains of dysuria, frequency, incontinence, urgency, and groin pressure. He has had symptoms for 3 days. Patient also complains of chills (?fever). Denies back pain. Patient does have a history of recurrent UTI.  Patient does not have a history of pyelonephritis.    ROS: Negative unless specifically indicated above in HPI.   Relevant past medical history reviewed and updated as indicated.   Allergies and medications reviewed and updated.   Current Outpatient Medications:    albuterol  (VENTOLIN  HFA) 108 (90 Base) MCG/ACT inhaler, INHALE 2 PUFFS BY MOUTH EVERY 6 HOURS AS NEEDED FOR WHEEZING FOR SHORTNESS OF BREATH, Disp: 18 g, Rfl: 5   diclofenac  (VOLTAREN ) 75 MG EC tablet, Take 1 tablet by mouth twice daily as needed, Disp: 60 tablet, Rfl: 3   fluticasone  (FLONASE ) 50 MCG/ACT nasal spray, Use 2 spray(s) in each nostril once daily, Disp: 16 g, Rfl: 0   Fluticasone -Umeclidin-Vilant (TRELEGY ELLIPTA ) 100-62.5-25 MCG/ACT AEPB, INHALE 1 PUFF ONCE DAILY, Disp: 180 each, Rfl: 3   levothyroxine  (SYNTHROID ) 200 MCG tablet, Take 1 tablet (200 mcg total) by mouth daily before breakfast., Disp: 90 tablet, Rfl: 0   rosuvastatin  (CRESTOR ) 10 MG tablet, Take 1 tablet by mouth once daily, Disp: 90 tablet, Rfl: 0   Semaglutide  (RYBELSUS ) 14 MG TABS, Take 1  tablet (14 mg total) by mouth daily., Disp: 90 tablet, Rfl: 2  No Known Allergies  Objective:   BP 132/78   Pulse (!) 108   Temp 98.8 F (37.1 C)   Resp 18   Ht 5' 10 (1.778 m)   Wt 249 lb (112.9 kg)   SpO2 96%   BMI 35.73 kg/m    Physical Exam Vitals reviewed.  Constitutional:      General: He is not in acute distress.    Appearance: Normal appearance. He is not ill-appearing, toxic-appearing or diaphoretic.  HENT:     Head: Normocephalic and atraumatic.  Eyes:     General: No scleral icterus.       Right eye: No discharge.        Left eye: No discharge.     Conjunctiva/sclera: Conjunctivae normal.  Cardiovascular:     Rate and Rhythm: Normal rate.  Pulmonary:     Effort: Pulmonary effort is normal. No respiratory distress.  Musculoskeletal:        General: Normal range of motion.     Cervical back: Normal range of motion.  Skin:    General: Skin is warm and dry.  Neurological:     Mental Status: He is alert and oriented to person, place, and time. Mental status is at baseline.  Psychiatric:        Mood and Affect: Mood normal.        Behavior: Behavior normal.        Thought Content: Thought content normal.  Judgment: Judgment normal.

## 2023-11-07 LAB — URINE CULTURE

## 2023-11-14 ENCOUNTER — Other Ambulatory Visit: Payer: Self-pay | Admitting: Internal Medicine

## 2023-11-22 ENCOUNTER — Other Ambulatory Visit: Payer: Self-pay | Admitting: Internal Medicine

## 2023-11-27 ENCOUNTER — Encounter: Payer: Self-pay | Admitting: Internal Medicine

## 2023-11-27 NOTE — Progress Notes (Signed)
 Subjective:    Patient ID: Dalton Murphy, male    DOB: 15-Mar-1951, 73 y.o.   MRN: [id number]     HPI Ashely is here for follow up of his chronic medical problems.  Had bronchitis june - July and then uti last month -- ? prostatitis.  He feels he has recovered, but it was several weeks of not feeling well.  He was not very active during that time.   Medications and allergies reviewed with patient and updated if appropriate.  Current Outpatient Medications on File Prior to Visit  Medication Sig Dispense Refill   albuterol  (VENTOLIN  HFA) 108 (90 Base) MCG/ACT inhaler INHALE 2 PUFFS BY MOUTH EVERY 6 HOURS AS NEEDED FOR WHEEZING FOR SHORTNESS OF BREATH 18 g 5   diclofenac  (VOLTAREN ) 75 MG EC tablet Take 1 tablet by mouth twice daily as needed 60 tablet 3   fluticasone  (FLONASE ) 50 MCG/ACT nasal spray Use 2 spray(s) in each nostril once daily 16 g 0   Fluticasone -Umeclidin-Vilant (TRELEGY ELLIPTA ) 100-62.5-25 MCG/ACT AEPB INHALE 1 PUFF ONCE DAILY 180 each 3   levothyroxine  (SYNTHROID ) 200 MCG tablet Take 1 tablet (200 mcg total) by mouth daily before breakfast. 90 tablet 0   rosuvastatin  (CRESTOR ) 10 MG tablet Take 1 tablet by mouth once daily 90 tablet 0   RYBELSUS  14 MG TABS Take 1 tablet by mouth once daily 90 tablet 0   No current facility-administered medications on file prior to visit.     Review of Systems  Constitutional:  Negative for fever.  Respiratory:  Negative for cough, shortness of breath and wheezing.   Cardiovascular:  Negative for chest pain, palpitations and leg swelling.  Genitourinary:        Nocturia- 2-3 times.  Feels like he is emptying your bladder  Neurological:  Negative for light-headedness and headaches.       Objective:   Vitals:   11/28/23 1102  BP: 130/74  Pulse: 88  Temp: 98 F (36.7 C)  SpO2: 94%   BP Readings from Last 3 Encounters:  11/28/23 130/74  11/05/23 132/78  10/22/23 120/64   Wt Readings from Last 3  Encounters:  11/28/23 250 lb (113.4 kg)  11/05/23 249 lb (112.9 kg)  10/22/23 251 lb 6.4 oz (114 kg)   Body mass index is 35.87 kg/m.    Physical Exam Constitutional:      General: He is not in acute distress.    Appearance: Normal appearance. He is not ill-appearing.  HENT:     Head: Normocephalic and atraumatic.  Eyes:     Conjunctiva/sclera: Conjunctivae normal.  Cardiovascular:     Rate and Rhythm: Normal rate and regular rhythm.     Heart sounds: Normal heart sounds.  Pulmonary:     Effort: Pulmonary effort is normal. No respiratory distress.     Breath sounds: No rales. Wheezes: mild expiratory wheeze. Musculoskeletal:     Right lower leg: No edema.     Left lower leg: No edema.  Skin:    General: Skin is warm and dry.     Findings: No rash.  Neurological:     Mental Status: He is alert. Mental status is at baseline.  Psychiatric:        Mood and Affect: Mood normal.         Diabetic Foot Exam - Simple   Simple Foot Form Diabetic Foot exam was performed with the following findings: Yes 11/28/2023 11:29 AM  Visual Inspection No deformities,  no ulcerations, no other skin breakdown bilaterally: Yes Sensation Testing Intact to touch and monofilament testing bilaterally: Yes Pulse Check Posterior Tibialis and Dorsalis pulse intact bilaterally: Yes Comments     Lab Results  Component Value Date   WBC 7.0 05/30/2023   HGB 15.5 05/30/2023   HCT 46.4 05/30/2023   PLT 189.0 05/30/2023   GLUCOSE 145 (H) 05/30/2023   CHOL 138 05/30/2023   TRIG 110.0 05/30/2023   HDL 60.70 05/30/2023   LDLDIRECT 113.0 03/30/2021   LDLCALC 56 05/30/2023   ALT 53 05/30/2023   AST 30 05/30/2023   NA 138 05/30/2023   K 4.9 05/30/2023   CL 103 05/30/2023   CREATININE 0.72 05/30/2023   BUN 17 05/30/2023   CO2 26 05/30/2023   TSH 1.10 05/30/2023   PSA 3.64 05/30/2023   INR 1.0 06/19/2018   HGBA1C 6.8 (H) 05/30/2023   MICROALBUR 4.9 (H) 05/30/2023     Assessment & Plan:     See Problem List for Assessment and Plan of chronic medical problems.

## 2023-11-27 NOTE — Patient Instructions (Addendum)
      Blood work was ordered.       Medications changes include :   increase trelegy inhaler dose with your next prescription    A referral was ordered for urology and someone will call you to schedule an appointment.     Return in about 6 months (around 05/30/2024) for Physical Exam.

## 2023-11-28 ENCOUNTER — Ambulatory Visit: Payer: Self-pay | Admitting: Internal Medicine

## 2023-11-28 ENCOUNTER — Ambulatory Visit: Payer: Medicare PPO | Admitting: Internal Medicine

## 2023-11-28 VITALS — BP 130/74 | HR 88 | Temp 98.0°F | Ht 70.0 in | Wt 250.0 lb

## 2023-11-28 DIAGNOSIS — E785 Hyperlipidemia, unspecified: Secondary | ICD-10-CM | POA: Diagnosis not present

## 2023-11-28 DIAGNOSIS — N401 Enlarged prostate with lower urinary tract symptoms: Secondary | ICD-10-CM | POA: Insufficient documentation

## 2023-11-28 DIAGNOSIS — R350 Frequency of micturition: Secondary | ICD-10-CM

## 2023-11-28 DIAGNOSIS — R0609 Other forms of dyspnea: Secondary | ICD-10-CM | POA: Diagnosis not present

## 2023-11-28 DIAGNOSIS — E1169 Type 2 diabetes mellitus with other specified complication: Secondary | ICD-10-CM | POA: Diagnosis not present

## 2023-11-28 DIAGNOSIS — M1712 Unilateral primary osteoarthritis, left knee: Secondary | ICD-10-CM | POA: Diagnosis not present

## 2023-11-28 DIAGNOSIS — E038 Other specified hypothyroidism: Secondary | ICD-10-CM | POA: Diagnosis not present

## 2023-11-28 LAB — COMPREHENSIVE METABOLIC PANEL WITH GFR
ALT: 22 U/L (ref 0–53)
AST: 18 U/L (ref 0–37)
Albumin: 4.1 g/dL (ref 3.5–5.2)
Alkaline Phosphatase: 69 U/L (ref 39–117)
BUN: 20 mg/dL (ref 6–23)
CO2: 23 meq/L (ref 19–32)
Calcium: 8.5 mg/dL (ref 8.4–10.5)
Chloride: 105 meq/L (ref 96–112)
Creatinine, Ser: 0.72 mg/dL (ref 0.40–1.50)
GFR: 90.82 mL/min (ref >60.00)
Glucose, Bld: 110 mg/dL — ABNORMAL HIGH (ref 70–99)
Potassium: 4 meq/L (ref 3.5–5.1)
Sodium: 138 meq/L (ref 135–145)
Total Bilirubin: 1.8 mg/dL — ABNORMAL HIGH (ref 0.2–1.2)
Total Protein: 7.2 g/dL (ref 6.0–8.3)

## 2023-11-28 LAB — LIPID PANEL
Cholesterol: 107 mg/dL (ref 0–200)
HDL: 49.4 mg/dL (ref >39.00)
LDL Cholesterol: 33 mg/dL (ref 0–99)
NonHDL: 57.12
Total CHOL/HDL Ratio: 2
Triglycerides: 120 mg/dL (ref 0.0–149.0)
VLDL: 24 mg/dL (ref 0.0–40.0)

## 2023-11-28 LAB — HEMOGLOBIN A1C: Hgb A1c MFr Bld: 6.9 % — ABNORMAL HIGH (ref 4.6–6.5)

## 2023-11-28 LAB — TSH: TSH: 0.53 u[IU]/mL (ref 0.35–5.50)

## 2023-11-28 MED ORDER — TRELEGY ELLIPTA 200-62.5-25 MCG/ACT IN AEPB: 1.0000 | INHALATION_SPRAY | Freq: Every day | RESPIRATORY_TRACT | Status: AC

## 2023-11-28 NOTE — Assessment & Plan Note (Signed)
 Chronic  Lab Results  Component Value Date   HGBA1C 6.8 (H) 05/30/2023   Sugars controlled Check A1c Continue Rybelsus  to 14 mg daily Stressed regular exercise, diabetic diet Encouraged weight loss

## 2023-11-28 NOTE — Assessment & Plan Note (Signed)
 Chronic Continue diclofenac  75 mcg once -twice daily as needed Following with orthopedics Reviewed risks of stomach ulcers/bleeding and kidney damage

## 2023-11-28 NOTE — Assessment & Plan Note (Signed)
 Chronic Regular exercise and healthy diet encouraged Check lipid panel, CMP Continue rosuvastatin  10 mg daily Lab Results  Component Value Date   LDLCALC 56 05/30/2023

## 2023-11-28 NOTE — Assessment & Plan Note (Addendum)
 Chronic Probable COPD-has not been officially tested and deferred testing at this time Improved with Trelegy inhaler daily-does have some mild wheeze on expiration so we will increase the Trelegy to 200-60 2.5-25 mcg per ACT with his next refill Continue albuterol  inhaler as needed

## 2023-11-28 NOTE — Assessment & Plan Note (Signed)
 Chronic  Clinically euthyroid Check tsh and will titrate med dose if needed Currently taking levothyroxine  200 mcg daily

## 2023-11-28 NOTE — Assessment & Plan Note (Signed)
?    BPH Recent UTI-?  Prostatitis Does have some frequent urination and leakage at times especially at night Discussed starting a medication versus seeing urology He is not really interested in screening medication if he can avoid it Recommended urology evaluation and discussed options-referral ordered

## 2023-12-11 ENCOUNTER — Other Ambulatory Visit: Payer: Self-pay | Admitting: Internal Medicine

## 2023-12-18 ENCOUNTER — Other Ambulatory Visit: Payer: Self-pay | Admitting: Internal Medicine

## 2023-12-19 ENCOUNTER — Telehealth: Payer: Self-pay | Admitting: Radiology

## 2023-12-19 DIAGNOSIS — H40053 Ocular hypertension, bilateral: Secondary | ICD-10-CM | POA: Diagnosis not present

## 2023-12-19 NOTE — Telephone Encounter (Signed)
 Copied from CRM [id number]. Topic: Clinical - Medical Advice >> Dec 19, 2023  9:26 AM Chasity T wrote: Reason for CRM: Dalton Murphy wife of patient is stating that he is not feeling well and has a lot of pain when he sits down, swings his legs, etc.  He is also still having urine leakage as well . Please contact her back to discuss

## 2023-12-20 ENCOUNTER — Encounter: Payer: Self-pay | Admitting: Internal Medicine

## 2023-12-20 NOTE — Progress Notes (Unsigned)
    Subjective:    Patient ID: Dalton Murphy, male    DOB: April 10, 1951, 73 y.o.   MRN: [id number]      HPI Berel is here for No chief complaint on file.   Pain when sitting down, swinging legs and has urine incontinence   2-4 weeks cipro , backtreim  Medications and allergies reviewed with patient and updated if appropriate.  Current Outpatient Medications on File Prior to Visit  Medication Sig Dispense Refill   albuterol  (VENTOLIN  HFA) 108 (90 Base) MCG/ACT inhaler INHALE 2 PUFFS BY MOUTH EVERY 6 HOURS AS NEEDED FOR WHEEZING FOR SHORTNESS OF BREATH 18 g 5   diclofenac  (VOLTAREN ) 75 MG EC tablet Take 1 tablet by mouth twice daily as needed 60 tablet 3   fluticasone  (FLONASE ) 50 MCG/ACT nasal spray Use 2 spray(s) in each nostril once daily 16 g 0   Fluticasone -Umeclidin-Vilant (TRELEGY ELLIPTA ) 200-62.5-25 MCG/ACT AEPB Inhale 1 puff into the lungs daily.     levothyroxine  (SYNTHROID ) 200 MCG tablet TAKE 1 TABLET BY MOUTH ONCE DAILY BEFORE BREAKFAST 90 tablet 0   rosuvastatin  (CRESTOR ) 10 MG tablet Take 1 tablet by mouth once daily 90 tablet 0   RYBELSUS  14 MG TABS Take 1 tablet by mouth once daily 90 tablet 0   No current facility-administered medications on file prior to visit.    Review of Systems     Objective:  There were no vitals filed for this visit. BP Readings from Last 3 Encounters:  11/28/23 130/74  11/05/23 132/78  10/22/23 120/64   Wt Readings from Last 3 Encounters:  11/28/23 250 lb (113.4 kg)  11/05/23 249 lb (112.9 kg)  10/22/23 251 lb 6.4 oz (114 kg)   There is no height or weight on file to calculate BMI.    Physical Exam         Assessment & Plan:    See Problem List for Assessment and Plan of chronic medical problems.

## 2023-12-20 NOTE — Telephone Encounter (Signed)
 Needs appt

## 2023-12-20 NOTE — Telephone Encounter (Signed)
 Appointment made for tomorrow

## 2023-12-21 ENCOUNTER — Ambulatory Visit

## 2023-12-21 ENCOUNTER — Ambulatory Visit: Admitting: Internal Medicine

## 2023-12-21 VITALS — BP 124/76 | HR 80 | Temp 98.4°F | Ht 70.0 in | Wt 257.0 lb

## 2023-12-21 DIAGNOSIS — M545 Low back pain, unspecified: Secondary | ICD-10-CM

## 2023-12-21 DIAGNOSIS — R350 Frequency of micturition: Secondary | ICD-10-CM | POA: Diagnosis not present

## 2023-12-21 DIAGNOSIS — G8929 Other chronic pain: Secondary | ICD-10-CM

## 2023-12-21 LAB — POC URINALSYSI DIPSTICK (AUTOMATED)
Bilirubin, UA: NEGATIVE
Blood, UA: NEGATIVE
Glucose, UA: NEGATIVE
Ketones, UA: NEGATIVE
Leukocytes, UA: NEGATIVE
Nitrite, UA: NEGATIVE
Protein, UA: POSITIVE — AB
Spec Grav, UA: >=1.03 — AB (ref 1.010–1.025)
Urobilinogen, UA: 0.2 U/dL
pH, UA: 5.5 (ref 5.0–8.0)

## 2023-12-21 MED ORDER — PREDNISONE 20 MG PO TABS: 20.0000 mg | ORAL_TABLET | Freq: Every day | ORAL | 0 refills | Status: AC

## 2023-12-21 NOTE — Assessment & Plan Note (Addendum)
 Acute on chronic Has some chronic lower back pain, has worsening back pain recently, but finds that to be intermittent times months Pain mostly in the left lower back Has pain that goes into his left upper leg and right upper leg-left leg is worse Being more active increases pain and certain movements increases pain Start prednisone  20 mg daily x 5 days-advised to hold diclofenac  while taking prednisone .  Discussed this will temporarily elevate his sugars, but sugar controlled we will feel the benefits outweigh possible side effects that are short-term Referral for physical therapy Advised weight loss Advised icing the back If no improvement could consider sports medicine evaluation

## 2023-12-21 NOTE — Patient Instructions (Addendum)
      Have an xray downstairs.       Medications changes include :   Prednisone  20 mg daily x 5 days ( hold the diclofenac  while taking this and restart after finishing the prednisone )    A referral was ordered for physical therapy and someone will call you to schedule an appointment.     Return if symptoms worsen or fail to improve.

## 2023-12-21 NOTE — Assessment & Plan Note (Signed)
 More chronic UA here negative for infection He does have BPH and is scheduled to see urology

## 2023-12-30 ENCOUNTER — Ambulatory Visit: Payer: Self-pay | Admitting: Internal Medicine

## 2024-01-03 ENCOUNTER — Telehealth: Payer: Self-pay | Admitting: Internal Medicine

## 2024-01-03 NOTE — Telephone Encounter (Signed)
 Patient's wife left a handicap placard form up front to be filled out.  It was placed in Dr. Geofm box  - please call her Orrin) at [phone] when this form is ready for pick up.

## 2024-01-03 NOTE — Telephone Encounter (Signed)
 Received and awaiting Dr. Geofm signature

## 2024-01-04 NOTE — Telephone Encounter (Signed)
Form left up front for pick up ?

## 2024-01-04 NOTE — Telephone Encounter (Signed)
 Spoke with spouse today

## 2024-02-08 ENCOUNTER — Other Ambulatory Visit: Payer: Self-pay | Admitting: Internal Medicine

## 2024-03-01 ENCOUNTER — Other Ambulatory Visit: Payer: Self-pay | Admitting: Internal Medicine

## 2024-03-02 ENCOUNTER — Other Ambulatory Visit: Payer: Self-pay | Admitting: Internal Medicine

## 2024-03-09 ENCOUNTER — Other Ambulatory Visit: Payer: Self-pay | Admitting: Internal Medicine

## 2024-03-13 ENCOUNTER — Other Ambulatory Visit: Payer: Self-pay | Admitting: Internal Medicine

## 2024-03-31 ENCOUNTER — Other Ambulatory Visit: Payer: Self-pay | Admitting: Internal Medicine

## 2024-04-07 ENCOUNTER — Other Ambulatory Visit: Payer: Self-pay | Admitting: Internal Medicine

## 2024-05-04 ENCOUNTER — Other Ambulatory Visit: Payer: Self-pay | Admitting: Internal Medicine

## 2024-05-05 ENCOUNTER — Other Ambulatory Visit: Payer: Self-pay | Admitting: Internal Medicine

## 2024-05-20 ENCOUNTER — Ambulatory Visit: Payer: Self-pay

## 2024-05-20 ENCOUNTER — Ambulatory Visit: Admitting: Internal Medicine

## 2024-05-20 ENCOUNTER — Encounter: Payer: Self-pay | Admitting: Internal Medicine

## 2024-05-20 VITALS — BP 126/70 | HR 88 | Temp 98.5°F | Ht 70.0 in | Wt 259.0 lb

## 2024-05-20 DIAGNOSIS — J441 Chronic obstructive pulmonary disease with (acute) exacerbation: Secondary | ICD-10-CM

## 2024-05-20 DIAGNOSIS — E1169 Type 2 diabetes mellitus with other specified complication: Secondary | ICD-10-CM

## 2024-05-20 DIAGNOSIS — J209 Acute bronchitis, unspecified: Secondary | ICD-10-CM

## 2024-05-20 LAB — POC COVID19 BINAXNOW: SARS Coronavirus 2 Ag: NEGATIVE

## 2024-05-20 MED ORDER — AMOXICILLIN-POT CLAVULANATE 875-125 MG PO TABS: 1.0000 | ORAL_TABLET | Freq: Two times a day (BID) | ORAL | 0 refills | Status: AC

## 2024-05-20 MED ORDER — HYDROCOD POLI-CHLORPHE POLI ER 10-8 MG/5ML PO SUER: 5.0000 mL | Freq: Two times a day (BID) | ORAL | 0 refills | Status: AC | PRN

## 2024-05-20 MED ORDER — PREDNISONE 20 MG PO TABS: 40.0000 mg | ORAL_TABLET | Freq: Every day | ORAL | 0 refills | Status: AC

## 2024-05-20 NOTE — Telephone Encounter (Signed)
 FYI Only or Action Required?: FYI only for provider: appointment scheduled on 2/3.  Patient was last seen in primary care on 12/21/2023 by Geofm Glade PARAS, MD.  Called Nurse Triage reporting Sinusitis.  Symptoms began several days ago.  Interventions attempted: OTC medications: Mucinex .  Symptoms are.  Triage Disposition: See PCP When Office is Open (Within 3 Days)  Patient/caregiver understands and will follow disposition?: Yes  Summary: Productive Cough with Mucous   Reason for Triage: possible sinus infection per pt, stuffy nose, productive cough with mucous;denies chest pain or SOB  requesting an appt with Dr. Geofm         Reason for Disposition  [1] Sinus congestion (pressure, fullness) AND [2] present > 10 days  Lots of coughing  Answer Assessment - Initial Assessment Questions 5. NASAL CONGESTION: Is the nose blocked? If Yes, ask: Can you open it or must you breathe through your mouth?     Nasal congestion, denies facial pain 6. NASAL DISCHARGE: Do you have discharge from your nose? If so ask, What color?     yellow 7. FEVER: Do you have a fever? If Yes, ask: What is it, how was it measured, and when did it start?      denies 8. OTHER SYMPTOMS: Do you have any other symptoms? (e.g., sore throat, cough, earache, difficulty breathing)     cough  Protocols used: Sinus Pain or Congestion-A-AH

## 2024-05-20 NOTE — Patient Instructions (Addendum)
" ° °  Continue trelegy daily and use albuterol  as needed    Medications changes include :   Augmentin  twice daily  and prednisone , cough syrup      Return if symptoms worsen or fail to improve, for follow up as scheduled.  "

## 2024-05-20 NOTE — Assessment & Plan Note (Addendum)
 Acute Likely bacterial with COPD exacerbation Start Augmentin  875-125 mg BID x 10 day Tussionex twice daily as needed Prednisone  40 mg daily x 5 days -lower dose has not been effective for him.  Discussed this may temporarily elevate his sugars Continue trelegy and albuterol  3 times a day otc cold medications Rest, fluid Call if no improvement

## 2024-05-23 ENCOUNTER — Ambulatory Visit: Payer: Medicare PPO

## 2024-05-23 VITALS — BP 124/60 | HR 80 | Temp 98.1°F | Ht 70.0 in | Wt 264.2 lb

## 2024-05-23 DIAGNOSIS — Z0000 Encounter for general adult medical examination without abnormal findings: Secondary | ICD-10-CM

## 2024-05-23 NOTE — Patient Instructions (Signed)
 Mr. Hickox,  Thank you for taking the time for your Medicare Wellness Visit. I appreciate your continued commitment to your health goals. Please review the care plan we discussed, and feel free to reach out if I can assist you further.  Please note that Annual Wellness Visits do not include a physical exam. Some assessments may be limited, especially if the visit was conducted virtually. If needed, we may recommend an in-person follow-up with your provider.  Ongoing Care Seeing your primary care provider every 3 to 6 months helps us  monitor your health and provide consistent, personalized care.   Referrals If a referral was made during today's visit and you haven't received any updates within two weeks, please contact the referred provider directly to check on the status.  Recommended Screenings:  Health Maintenance  Topic Date Due   Eye exam for diabetics  10/21/2022   COVID-19 Vaccine (3 - 2025-26 season) 12/17/2023   Medicare Annual Wellness Visit  05/22/2024   Kidney health urinalysis for diabetes  05/29/2024   Flu Shot  07/15/2024*   Hemoglobin A1C  05/30/2024   Yearly kidney function blood test for diabetes  11/27/2024   Complete foot exam   11/27/2024   DTaP/Tdap/Td vaccine (2 - Td or Tdap) 12/13/2024   Colon Cancer Screening  08/11/2025   Pneumococcal Vaccine for age over 68  Completed   Hepatitis C Screening  Completed   Zoster (Shingles) Vaccine  Completed   Meningitis B Vaccine  Aged Out  *Topic was postponed. The date shown is not the original due date.       05/22/2024   12:44 PM  Advanced Directives  Does Patient Have a Medical Advance Directive? Yes  Type of Estate Agent of [place];Living will  Does patient want to make changes to medical advance directive? No - Patient declined  Copy of Healthcare Power of Attorney in Chart? No - copy requested    Vision: Annual vision screenings are recommended for early detection of glaucoma,  cataracts, and diabetic retinopathy. These exams can also reveal signs of chronic conditions such as diabetes and high blood pressure.  Dental: Annual dental screenings help detect early signs of oral cancer, gum disease, and other conditions linked to overall health, including heart disease and diabetes.  Please see the attached documents for additional preventive care recommendations.
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
## 2024-06-11 ENCOUNTER — Encounter: Admitting: Internal Medicine
